# Patient Record
Sex: Female | Born: 1955
Health system: Southern US, Community
[De-identification: ages and names within clinical notes are randomized; demographics above are authoritative.]

## PROBLEM LIST (undated history)

## (undated) DIAGNOSIS — D125 Benign neoplasm of sigmoid colon: Secondary | ICD-10-CM

## (undated) DIAGNOSIS — J329 Chronic sinusitis, unspecified: Secondary | ICD-10-CM

## (undated) DIAGNOSIS — Z972 Presence of dental prosthetic device (complete) (partial): Secondary | ICD-10-CM

## (undated) DIAGNOSIS — K5909 Other constipation: Secondary | ICD-10-CM

## (undated) DIAGNOSIS — J90 Pleural effusion, not elsewhere classified: Secondary | ICD-10-CM

## (undated) DIAGNOSIS — K631 Perforation of intestine (nontraumatic): Secondary | ICD-10-CM

## (undated) DIAGNOSIS — K5792 Diverticulitis of intestine, part unspecified, without perforation or abscess without bleeding: Secondary | ICD-10-CM

## (undated) DIAGNOSIS — N3090 Cystitis, unspecified without hematuria: Secondary | ICD-10-CM

## (undated) DIAGNOSIS — Z0000 Encounter for general adult medical examination without abnormal findings: Secondary | ICD-10-CM

## (undated) HISTORY — DX: Chronic sinusitis, unspecified: J32.9

## (undated) HISTORY — DX: Encounter for general adult medical examination without abnormal findings: Z00.00

## (undated) HISTORY — DX: Perforation of intestine (nontraumatic): K63.1

## (undated) HISTORY — DX: Diverticulitis of intestine, part unspecified, without perforation or abscess without bleeding: K57.92

## (undated) HISTORY — DX: Benign neoplasm of sigmoid colon: D12.5

---

## 2006-09-30 ENCOUNTER — Ambulatory Visit: Payer: Self-pay | Admitting: Obstetrics and Gynecology

## 2007-05-17 ENCOUNTER — Ambulatory Visit: Payer: Self-pay | Admitting: Unknown Physician Specialty

## 2007-05-23 ENCOUNTER — Emergency Department: Payer: Self-pay | Admitting: Emergency Medicine

## 2007-07-26 ENCOUNTER — Ambulatory Visit: Payer: Self-pay | Admitting: Gastroenterology

## 2009-08-01 ENCOUNTER — Ambulatory Visit: Payer: Self-pay | Admitting: Internal Medicine

## 2009-11-26 ENCOUNTER — Ambulatory Visit: Payer: Self-pay | Admitting: General Practice

## 2013-06-15 ENCOUNTER — Ambulatory Visit: Payer: Self-pay | Admitting: Internal Medicine

## 2014-04-07 DIAGNOSIS — J90 Pleural effusion, not elsewhere classified: Secondary | ICD-10-CM

## 2014-04-07 HISTORY — DX: Pleural effusion, not elsewhere classified: J90

## 2014-06-02 ENCOUNTER — Emergency Department: Payer: Self-pay | Admitting: Emergency Medicine

## 2014-12-20 ENCOUNTER — Encounter: Payer: Self-pay | Admitting: Primary Care

## 2014-12-20 ENCOUNTER — Ambulatory Visit (INDEPENDENT_AMBULATORY_CARE_PROVIDER_SITE_OTHER): Payer: Managed Care, Other (non HMO) | Admitting: Primary Care

## 2014-12-20 ENCOUNTER — Encounter (INDEPENDENT_AMBULATORY_CARE_PROVIDER_SITE_OTHER): Payer: Self-pay

## 2014-12-20 VITALS — BP 118/72 | HR 71 | Temp 98.0°F | Ht 64.25 in | Wt 154.4 lb

## 2014-12-20 DIAGNOSIS — J0100 Acute maxillary sinusitis, unspecified: Secondary | ICD-10-CM

## 2014-12-20 DIAGNOSIS — K5731 Diverticulosis of large intestine without perforation or abscess with bleeding: Secondary | ICD-10-CM | POA: Diagnosis not present

## 2014-12-20 DIAGNOSIS — K5900 Constipation, unspecified: Secondary | ICD-10-CM | POA: Diagnosis not present

## 2014-12-20 DIAGNOSIS — K5909 Other constipation: Secondary | ICD-10-CM

## 2014-12-20 MED ORDER — AMOXICILLIN-POT CLAVULANATE 875-125 MG PO TABS: 1.0000 | ORAL_TABLET | Freq: Two times a day (BID) | ORAL | Status: DC

## 2014-12-20 NOTE — Assessment & Plan Note (Signed)
Noted on colonoscopy. Last flare of diverticulitis was February 2016 with ED evaluation. No recent flare or bloody stools.

## 2014-12-20 NOTE — Assessment & Plan Note (Signed)
Evaluated by GI in past who completed study which showed slow transit bowel. BM are once weekly on average. She has a prescription for Linzess which she uses PRN. Uses mineral oil and miralax as well.

## 2014-12-20 NOTE — Progress Notes (Signed)
Subjective:    Patient ID: Natalie [name], female    DOB: [birth year], 59 y.o.   MRN: [id number]  HPI  Natalie [name] is a 59 year old female who presents today to establish care and discuss the problems mentioned below. Will obtain old records.  1) Diverticulosis: Found on colonoscopy. Will get attacks that consist of severe abdominal cramping with constipation and then diarrhea with blood in her stool. Last episode was in February 2016. She presented to the ED and was treated for diverticulitis. She was treated with cipro and flagyl which she could not tolerate.   2) Nasal congestion: Nasal drainage, sneezing, post nasal drip, with nasal congestion over the past 2 weeks. She also reports lightheadedness intermittently when driving in the morning, sinus pressure, and fatigue. Denies fevers. She has a history of chronic sinusitis. She's been using Flonase. She cannot tolerate antihistamines such as Claritin. Overall she feels her symptoms have become worse, especially with nasal congestion and sinus pressure. She doesn't feel well.  3) Chronic constipation: Slow transit bowel. Bowel movements vary and on average will occur once weekly. She has a prescription for Linzess that she uses PRN. She does take mineral oil and/or miralax. Denies recent episodes of blood in her stool.   Review of Systems  Constitutional: Negative for fever and chills.  HENT: Positive for congestion, postnasal drip and sinus pressure. Negative for ear pain, sneezing and sore throat.   Respiratory: Positive for cough. Negative for shortness of breath.   Cardiovascular: Negative for chest pain.  Gastrointestinal: Positive for constipation. Negative for diarrhea.       Chronic constipation: Bowel movements vary  Genitourinary: Negative for difficulty urinating.  Musculoskeletal: Negative for myalgias and arthralgias.  Skin: Negative for rash.  Neurological: Positive for light-headedness. Negative for numbness and  headaches.  Psychiatric/Behavioral:       Denies concerns of anxiety or depression       Past Medical History  Diagnosis Date  . Blood in stool 2/20016  . Diverticulitis   . UTI (lower urinary tract infection)   . Chronic sinusitis     Social History   Social History  . Marital Status: Married    Spouse Name: N/A  . Number of Children: N/A  . Years of Education: N/A   Occupational History  . Not on file.   Social History Main Topics  . Smoking status: Never Smoker   . Smokeless tobacco: Not on file  . Alcohol Use: No  . Drug Use: Not on file  . Sexual Activity: Not on file   Other Topics Concern  . Not on file   Social History Narrative   Married.   Works for [employer] in [place].   Is a Equities trader.   Enjoys spending time with family, reading, walking. She is raising her granddaughter.       History reviewed. No pertinent past surgical history.  Family History  Problem Relation Age of Onset  . Stroke Mother   . Arthritis Father   . Lung cancer Father   . Colon cancer Maternal Aunt   . Breast cancer Paternal Aunt     Allergies  Allergen Reactions  . Codeine Nausea Only  . Levofloxacin In D5w Other (See Comments)    insomnia  . Sulfa Antibiotics Rash    No current outpatient prescriptions on file prior to visit.   No current facility-administered medications on file prior to visit.    BP 118/72 mmHg  Pulse  71  Temp(Src) 98 F (36.7 C) (Oral)  Ht 5' 4.25" (1.632 m)  Wt 154 lb 6.4 oz (70.035 kg)  BMI 26.30 kg/m2  SpO2 99%    Objective:   Physical Exam  Constitutional: She is oriented to person, place, and time. She appears well-nourished.  HENT:  Right Ear: Tympanic membrane and ear canal normal.  Left Ear: Ear canal normal. A middle ear effusion is present.  Nose: Right sinus exhibits maxillary sinus tenderness. Right sinus exhibits no frontal sinus tenderness. Left sinus exhibits maxillary sinus tenderness. Left  sinus exhibits no frontal sinus tenderness.  Mouth/Throat: Oropharynx is clear and moist.  Eyes: Conjunctivae are normal. Pupils are equal, round, and reactive to light.  Neck: Neck supple.  Cardiovascular: Normal rate and regular rhythm.   Pulmonary/Chest: Effort normal and breath sounds normal.  Lymphadenopathy:    She has no cervical adenopathy.  Neurological: She is alert and oriented to person, place, and time.  Skin: Skin is warm and dry.          Assessment & Plan:  Sinusitis:  Nasal congestion, sinus pressure, fatigue x 2 weeks. Overall symptoms are worse, especially maxillary sinus pressure. Maxillary sinuses tender upon exam, overall exam unreamarkable. Due to fatigue and duration of symptoms will start Augmentin BID x 10 days. Use Flonase, neti pots. Follow up PRN

## 2014-12-20 NOTE — Progress Notes (Signed)
Pre visit review using our clinic review tool, if applicable. No additional management support is needed unless otherwise documented below in the visit note. 

## 2014-12-20 NOTE — Patient Instructions (Signed)
Start Augmentin antibiotics. Take 1 tablet by mouth twice daily for 10 days.  Continue to use fluticasone as tolerated. You may also use Neti Pots, over the counter, which will help to irrigate your sinuses.   Increase in take of water to stay hydrated.  Please schedule a physical with me in the next 3 months. You will also schedule a lab only appointment one week prior. We will discuss your lab results during your physical.  It was a pleasure to meet you today! Please don't hesitate to call me with any questions. Welcome to [facility]!

## 2015-02-23 ENCOUNTER — Other Ambulatory Visit: Payer: Self-pay | Admitting: Internal Medicine

## 2015-02-23 DIAGNOSIS — Z0000 Encounter for general adult medical examination without abnormal findings: Secondary | ICD-10-CM

## 2015-02-27 ENCOUNTER — Inpatient Hospital Stay
Admission: EM | Admit: 2015-02-27 | Discharge: 2015-03-16 | DRG: 329 | Disposition: A | Discharge status: Home Health | Payer: Managed Care, Other (non HMO) | Attending: Surgery | Admitting: Surgery

## 2015-02-27 ENCOUNTER — Encounter: Payer: Self-pay | Admitting: Emergency Medicine

## 2015-02-27 DIAGNOSIS — R109 Unspecified abdominal pain: Secondary | ICD-10-CM

## 2015-02-27 DIAGNOSIS — K913 Postprocedural intestinal obstruction: Secondary | ICD-10-CM | POA: Diagnosis not present

## 2015-02-27 DIAGNOSIS — K5720 Diverticulitis of large intestine with perforation and abscess without bleeding: Principal | ICD-10-CM | POA: Diagnosis present

## 2015-02-27 DIAGNOSIS — Y838 Other surgical procedures as the cause of abnormal reaction of the patient, or of later complication, without mention of misadventure at the time of the procedure: Secondary | ICD-10-CM | POA: Diagnosis not present

## 2015-02-27 DIAGNOSIS — J9589 Other postprocedural complications and disorders of respiratory system, not elsewhere classified: Secondary | ICD-10-CM | POA: Diagnosis not present

## 2015-02-27 DIAGNOSIS — R0602 Shortness of breath: Secondary | ICD-10-CM

## 2015-02-27 DIAGNOSIS — R0600 Dyspnea, unspecified: Secondary | ICD-10-CM

## 2015-02-27 DIAGNOSIS — J0100 Acute maxillary sinusitis, unspecified: Secondary | ICD-10-CM

## 2015-02-27 DIAGNOSIS — I959 Hypotension, unspecified: Secondary | ICD-10-CM | POA: Diagnosis present

## 2015-02-27 DIAGNOSIS — K5909 Other constipation: Secondary | ICD-10-CM | POA: Diagnosis present

## 2015-02-27 DIAGNOSIS — E8770 Fluid overload, unspecified: Secondary | ICD-10-CM | POA: Diagnosis not present

## 2015-02-27 DIAGNOSIS — D473 Essential (hemorrhagic) thrombocythemia: Secondary | ICD-10-CM | POA: Diagnosis not present

## 2015-02-27 DIAGNOSIS — Z882 Allergy status to sulfonamides status: Secondary | ICD-10-CM

## 2015-02-27 DIAGNOSIS — K631 Perforation of intestine (nontraumatic): Secondary | ICD-10-CM

## 2015-02-27 DIAGNOSIS — L03311 Cellulitis of abdominal wall: Secondary | ICD-10-CM | POA: Diagnosis not present

## 2015-02-27 DIAGNOSIS — Z881 Allergy status to other antibiotic agents status: Secondary | ICD-10-CM

## 2015-02-27 DIAGNOSIS — J9811 Atelectasis: Secondary | ICD-10-CM | POA: Diagnosis not present

## 2015-02-27 DIAGNOSIS — R198 Other specified symptoms and signs involving the digestive system and abdomen: Secondary | ICD-10-CM

## 2015-02-27 DIAGNOSIS — J90 Pleural effusion, not elsewhere classified: Secondary | ICD-10-CM | POA: Diagnosis not present

## 2015-02-27 DIAGNOSIS — R21 Rash and other nonspecific skin eruption: Secondary | ICD-10-CM | POA: Diagnosis not present

## 2015-02-27 DIAGNOSIS — B370 Candidal stomatitis: Secondary | ICD-10-CM | POA: Diagnosis not present

## 2015-02-27 DIAGNOSIS — Z9889 Other specified postprocedural states: Secondary | ICD-10-CM

## 2015-02-27 DIAGNOSIS — T814XXA Infection following a procedure, initial encounter: Secondary | ICD-10-CM | POA: Diagnosis not present

## 2015-02-27 DIAGNOSIS — J029 Acute pharyngitis, unspecified: Secondary | ICD-10-CM | POA: Diagnosis not present

## 2015-02-27 DIAGNOSIS — J869 Pyothorax without fistula: Secondary | ICD-10-CM | POA: Diagnosis not present

## 2015-02-27 DIAGNOSIS — K5732 Diverticulitis of large intestine without perforation or abscess without bleeding: Secondary | ICD-10-CM | POA: Insufficient documentation

## 2015-02-27 DIAGNOSIS — J95821 Acute postprocedural respiratory failure: Secondary | ICD-10-CM | POA: Diagnosis not present

## 2015-02-27 DIAGNOSIS — Z885 Allergy status to narcotic agent status: Secondary | ICD-10-CM

## 2015-02-27 MED ORDER — MORPHINE SULFATE (PF) 2 MG/ML IV SOLN
INTRAVENOUS | Status: AC
  Administered 2015-02-27: 2 mg via INTRAVENOUS
  Filled 2015-02-27: qty 1

## 2015-02-27 MED ORDER — IOHEXOL 240 MG/ML SOLN
25.0000 mL | Freq: Once | INTRAMUSCULAR | Status: AC | PRN
  Administered 2015-02-27: 25 mL via ORAL

## 2015-02-27 MED ORDER — ONDANSETRON HCL 4 MG/2ML IJ SOLN
INTRAMUSCULAR | Status: AC
  Administered 2015-02-27: 4 mg via INTRAVENOUS
  Filled 2015-02-27: qty 2

## 2015-02-27 MED ORDER — MORPHINE SULFATE (PF) 2 MG/ML IV SOLN
2.0000 mg | Freq: Once | INTRAVENOUS | Status: AC
  Administered 2015-02-27: 2 mg via INTRAVENOUS

## 2015-02-27 MED ORDER — SODIUM CHLORIDE 0.9 % IV BOLUS (SEPSIS)
1000.0000 mL | Freq: Once | INTRAVENOUS | Status: AC
  Administered 2015-02-27: 1000 mL via INTRAVENOUS

## 2015-02-27 MED ORDER — ONDANSETRON HCL 4 MG/2ML IJ SOLN
4.0000 mg | Freq: Once | INTRAMUSCULAR | Status: AC
  Administered 2015-02-27: 4 mg via INTRAVENOUS

## 2015-02-27 NOTE — ED Notes (Addendum)
Pt presents to ED via EMS with c/o abdominal pain, onset today with nausea and dry heaves. No bowl movement for about a week.

## 2015-02-27 NOTE — ED Provider Notes (Signed)
[facility] Emergency Department Provider Note  ____________________________________________  Time seen: 11:15 PM  I have reviewed the triage vital signs and the nursing notes.   HISTORY  Chief Complaint Abdominal Pain     HPI Natalie [name] is a 59 y.o. female resents with 10 out of 10 left lower quadrant/right lower quadrant abdominal pain accompanied by nausea with acute onset today that has progressed over the course of the day. Patient admits to constipation however denies any fever or recent diarrhea. Patient noted to be hypotensive on presentation with a systolic blood pressure in the 80s.    Past Medical History  Diagnosis Date  . Blood in stool 2/20016  . Diverticulitis   . UTI (lower urinary tract infection)   . Chronic sinusitis     Patient Active Problem List   Diagnosis Date Noted  . Chronic constipation 12/20/2014  . Diverticulosis 12/20/2014    History reviewed. No pertinent past surgical history.  Current Outpatient Rx  Name  Route  Sig  Dispense  Refill  . amoxicillin-clavulanate (AUGMENTIN) 875-125 MG per tablet   Oral   Take 1 tablet by mouth 2 (two) times daily.   20 tablet   0   . DOCUSATE CALCIUM PO   Oral   Take 250 mg by mouth as needed.         . fluticasone (FLONASE) 50 MCG/ACT nasal spray   Each Nare   Place 1 spray into both nostrils daily.         . mineral oil liquid   Oral   Take 15 mLs by mouth as needed for moderate constipation. 1 Tbsp PRN         . Multiple Vitamins-Minerals (MULTIVITAMIN ADULT PO)   Oral   Take 1 tablet by mouth daily.           Allergies Codeine; Levofloxacin in d5w; and Sulfa antibiotics  Family History  Problem Relation Age of Onset  . Stroke Mother   . Arthritis Father   . Lung cancer Father   . Colon cancer Maternal Aunt   . Breast cancer Paternal Aunt     Social History Social History  Substance Use Topics  . Smoking status: Never Smoker   .  Smokeless tobacco: None  . Alcohol Use: No    Review of Systems  Constitutional: Negative for fever. Eyes: Negative for visual changes. ENT: Negative for sore throat. Cardiovascular: Negative for chest pain. Respiratory: Negative for shortness of breath. Gastrointestinal: Positive for abdominal pain and nausea Genitourinary: Negative for dysuria. Musculoskeletal: Negative for back pain. Skin: Negative for rash. Neurological: Negative for headaches, focal weakness or numbness.   10-point ROS otherwise negative.  ____________________________________________   PHYSICAL EXAM:  VITAL SIGNS: ED Triage Vitals  Enc Vitals Group     BP 02/27/15 2316 93/57 mmHg     Pulse Rate 02/27/15 2316 85     Resp 02/27/15 2316 19     Temp 02/27/15 2316 97.4 F (36.3 C)     Temp Source 02/27/15 2316 Oral     SpO2 02/27/15 2316 97 %     Weight --      Height --      Head Cir --      Peak Flow --      Pain Score 02/27/15 2320 10     Pain Loc --      Pain Edu? --      Excl. in [place]? --  Constitutional: Alert and oriented. Well appearing and in no distress. Eyes: Conjunctivae are normal. PERRL. Normal extraocular movements. ENT   Head: Normocephalic and atraumatic.   Nose: No congestion/rhinnorhea.   Mouth/Throat: Mucous membranes are moist.   Neck: No stridor. Hematological/Lymphatic/Immunilogical: No cervical lymphadenopathy. Cardiovascular: Normal rate, regular rhythm. Normal and symmetric distal pulses are present in all extremities. No murmurs, rubs, or gallops. Respiratory: Normal respiratory effort without tachypnea nor retractions. Breath sounds are clear and equal bilaterally. No wheezes/rales/rhonchi. Gastrointestinal: Left lower quadrant/right lower quadrant tenderness to palpation. No distention. There is no CVA tenderness. Genitourinary: deferred Musculoskeletal: Nontender with normal range of motion in all extremities. No joint effusions.  No lower extremity  tenderness nor edema. Neurologic:  Normal speech and language. No gross focal neurologic deficits are appreciated. Speech is normal.  Skin:  Skin is warm, dry and intact. No rash noted. Psychiatric: Mood and affect are normal. Speech and behavior are normal. Patient exhibits appropriate insight and judgment.  ____________________________________________    LABS (pertinent positives/negatives)  Labs Reviewed  CBC WITH DIFFERENTIAL/PLATELET - Abnormal; Notable for the following:    WBC 14.0 (*)    Neutro Abs 12.0 (*)    All other components within normal limits  COMPREHENSIVE METABOLIC PANEL - Abnormal; Notable for the following:    Potassium 3.1 (*)    Glucose, Bld 170 (*)    All other components within normal limits  LIPASE, BLOOD         RADIOLOGY  CT Abdomen Pelvis W Contrast (Final result) Result time: 02/28/15 01:57:09   Final result by Rad Results In Interface (02/28/15 01:57:09)   Narrative:   CLINICAL DATA: Left lower quadrant and right lower quadrant abdominal pain onset today. Nausea. No bowel movement for a week.  EXAM: CT ABDOMEN AND PELVIS WITH CONTRAST  TECHNIQUE: Multidetector CT imaging of the abdomen and pelvis was performed using the standard protocol following bolus administration of intravenous contrast.  CONTRAST: 175mL OMNIPAQUE IOHEXOL 300 MG/ML SOLN  COMPARISON: None.  FINDINGS: Atelectasis in the lung bases.  There is free intra-abdominal air demonstrated mostly in the upper abdomen but extending inferiorly into the pelvis. This is concerning for bowel perforation. There is a small amount of free fluid in the abdomen and pelvis. Focal thickening and infiltration in the wall of the sigmoid colon suggests sigmoid diverticulitis as a possible site of perforation.  The liver, spleen, gallbladder, pancreas, adrenal glands, kidneys, abdominal aorta, inferior vena cava, and retroperitoneal lymph nodes are unremarkable. Stomach is filled  with contrast material without evidence of wall thickening or abnormal distention. Small bowel are decompressed. Diffusely stool-filled colon.  Pelvis: Uterus and ovaries are not enlarged. Bladder wall is not thickened. No pelvic mass or lymphadenopathy is suggested. No destructive bone lesions.  IMPRESSION: Free intraperitoneal air and free fluid in the abdomen with focal thickening and infiltration in the wall of the sigmoid colon. Appearance is consistent with bowel perforation, likely resulting from sigmoid diverticulitis.  These results were called by telephone at the time of interpretation on 02/28/2015 at 1:54 am to Dr. Marjean Donna , who verbally acknowledged these results.   Electronically Signed By: Lucienne Capers M.D. On: 02/28/2015 01:57      Critical Care performed: CRITICAL CARE Performed by: Marjean Donna N   Total critical care time: 60 minutes  Critical care time was exclusive of separately billable procedures and treating other patients.  Critical care was necessary to treat or prevent imminent or life-threatening deterioration.  Critical care was time  spent personally by me on the following activities: development of treatment plan with patient and/or surrogate as well as nursing, discussions with consultants, evaluation of patient's response to treatment, examination of patient, obtaining history from patient or surrogate, ordering and performing treatments and interventions, ordering and review of laboratory studies, ordering and review of radiographic studies, pulse oximetry and re-evaluation of patient's condition.  ____________________________________________   INITIAL IMPRESSION / ASSESSMENT AND PLAN / ED COURSE  Pertinent labs & imaging results that were available during my care of the patient were reviewed by me and considered in my medical decision making (see chart for details).  Patient received 30 mls/kg of normal saline, IV  vancomycin and Zosyn for a perforated intra-abdominal viscus  ____________________________________________   FINAL CLINICAL IMPRESSION(S) / ED DIAGNOSES  Final diagnoses:  Sigmoid diverticulitis  Perforated viscus      Gregor Hams, MD 03/02/15 [phone]

## 2015-02-28 ENCOUNTER — Emergency Department: Payer: Managed Care, Other (non HMO)

## 2015-02-28 DIAGNOSIS — L03311 Cellulitis of abdominal wall: Secondary | ICD-10-CM | POA: Diagnosis not present

## 2015-02-28 DIAGNOSIS — J90 Pleural effusion, not elsewhere classified: Secondary | ICD-10-CM | POA: Diagnosis not present

## 2015-02-28 DIAGNOSIS — J869 Pyothorax without fistula: Secondary | ICD-10-CM | POA: Diagnosis not present

## 2015-02-28 DIAGNOSIS — J029 Acute pharyngitis, unspecified: Secondary | ICD-10-CM | POA: Diagnosis not present

## 2015-02-28 DIAGNOSIS — D473 Essential (hemorrhagic) thrombocythemia: Secondary | ICD-10-CM | POA: Diagnosis not present

## 2015-02-28 DIAGNOSIS — J9589 Other postprocedural complications and disorders of respiratory system, not elsewhere classified: Secondary | ICD-10-CM | POA: Diagnosis not present

## 2015-02-28 DIAGNOSIS — R0600 Dyspnea, unspecified: Secondary | ICD-10-CM | POA: Diagnosis not present

## 2015-02-28 DIAGNOSIS — J948 Other specified pleural conditions: Secondary | ICD-10-CM | POA: Diagnosis not present

## 2015-02-28 DIAGNOSIS — J9621 Acute and chronic respiratory failure with hypoxia: Secondary | ICD-10-CM | POA: Diagnosis not present

## 2015-02-28 DIAGNOSIS — R0602 Shortness of breath: Secondary | ICD-10-CM | POA: Diagnosis not present

## 2015-02-28 DIAGNOSIS — R21 Rash and other nonspecific skin eruption: Secondary | ICD-10-CM | POA: Diagnosis not present

## 2015-02-28 DIAGNOSIS — J95821 Acute postprocedural respiratory failure: Secondary | ICD-10-CM | POA: Diagnosis not present

## 2015-02-28 DIAGNOSIS — Y838 Other surgical procedures as the cause of abnormal reaction of the patient, or of later complication, without mention of misadventure at the time of the procedure: Secondary | ICD-10-CM | POA: Diagnosis not present

## 2015-02-28 DIAGNOSIS — K913 Postprocedural intestinal obstruction: Secondary | ICD-10-CM | POA: Diagnosis not present

## 2015-02-28 DIAGNOSIS — E8770 Fluid overload, unspecified: Secondary | ICD-10-CM | POA: Diagnosis not present

## 2015-02-28 DIAGNOSIS — Z881 Allergy status to other antibiotic agents status: Secondary | ICD-10-CM | POA: Diagnosis not present

## 2015-02-28 DIAGNOSIS — K5720 Diverticulitis of large intestine with perforation and abscess without bleeding: Principal | ICD-10-CM

## 2015-02-28 DIAGNOSIS — Z885 Allergy status to narcotic agent status: Secondary | ICD-10-CM | POA: Diagnosis not present

## 2015-02-28 DIAGNOSIS — Z882 Allergy status to sulfonamides status: Secondary | ICD-10-CM | POA: Diagnosis not present

## 2015-02-28 DIAGNOSIS — B370 Candidal stomatitis: Secondary | ICD-10-CM | POA: Diagnosis not present

## 2015-02-28 DIAGNOSIS — J9601 Acute respiratory failure with hypoxia: Secondary | ICD-10-CM | POA: Diagnosis not present

## 2015-02-28 DIAGNOSIS — R69 Illness, unspecified: Secondary | ICD-10-CM | POA: Diagnosis not present

## 2015-02-28 DIAGNOSIS — K631 Perforation of intestine (nontraumatic): Secondary | ICD-10-CM | POA: Diagnosis not present

## 2015-02-28 DIAGNOSIS — J9811 Atelectasis: Secondary | ICD-10-CM | POA: Diagnosis not present

## 2015-02-28 DIAGNOSIS — I959 Hypotension, unspecified: Secondary | ICD-10-CM | POA: Diagnosis present

## 2015-02-28 DIAGNOSIS — T814XXA Infection following a procedure, initial encounter: Secondary | ICD-10-CM | POA: Diagnosis not present

## 2015-02-28 DIAGNOSIS — K5909 Other constipation: Secondary | ICD-10-CM | POA: Diagnosis present

## 2015-02-28 DIAGNOSIS — Z9889 Other specified postprocedural states: Secondary | ICD-10-CM | POA: Diagnosis not present

## 2015-02-28 DIAGNOSIS — K5732 Diverticulitis of large intestine without perforation or abscess without bleeding: Secondary | ICD-10-CM | POA: Diagnosis not present

## 2015-02-28 LAB — CBC WITH DIFFERENTIAL/PLATELET
Basophils Absolute: 0 10*3/uL (ref 0–0.1)
Basophils Relative: 0 %
Eosinophils Absolute: 0.1 10*3/uL (ref 0–0.7)
Eosinophils Relative: 1 %
HCT: 42.2 % (ref 35.0–47.0)
Hemoglobin: 14.1 g/dL (ref 12.0–16.0)
Lymphocytes Relative: 10 %
Lymphs Abs: 1.4 10*3/uL (ref 1.0–3.6)
MCH: 31.8 pg (ref 26.0–34.0)
MCHC: 33.5 g/dL (ref 32.0–36.0)
MCV: 95 fL (ref 80.0–100.0)
Monocytes Absolute: 0.4 10*3/uL (ref 0.2–0.9)
Monocytes Relative: 3 %
Neutro Abs: 12 10*3/uL — ABNORMAL HIGH (ref 1.4–6.5)
Neutrophils Relative %: 86 %
Platelets: 245 10*3/uL (ref 150–440)
RBC: 4.44 MIL/uL (ref 3.80–5.20)
RDW: 12.7 % (ref 11.5–14.5)
WBC: 14 10*3/uL — ABNORMAL HIGH (ref 3.6–11.0)

## 2015-02-28 LAB — CBC
HEMATOCRIT: 41.1 % (ref 35.0–47.0)
HEMOGLOBIN: 13.9 g/dL (ref 12.0–16.0)
MCH: 32.5 pg (ref 26.0–34.0)
MCHC: 33.7 g/dL (ref 32.0–36.0)
MCV: 96.4 fL (ref 80.0–100.0)
Platelets: 205 10*3/uL (ref 150–440)
RBC: 4.26 MIL/uL (ref 3.80–5.20)
RDW: 13.2 % (ref 11.5–14.5)
WBC: 10.2 10*3/uL (ref 3.6–11.0)

## 2015-02-28 LAB — BASIC METABOLIC PANEL
Anion gap: 8 (ref 5–15)
BUN: 8 mg/dL (ref 6–20)
CALCIUM: 8 mg/dL — AB (ref 8.9–10.3)
CHLORIDE: 112 mmol/L — AB (ref 101–111)
CO2: 21 mmol/L — AB (ref 22–32)
CREATININE: 0.63 mg/dL (ref 0.44–1.00)
GFR calc Af Amer: >60 mL/min (ref >60)
GFR calc non Af Amer: >60 mL/min (ref >60)
GLUCOSE: 112 mg/dL — AB (ref 65–99)
Potassium: 3.7 mmol/L (ref 3.5–5.1)
Sodium: 141 mmol/L (ref 135–145)

## 2015-02-28 LAB — LIPASE, BLOOD: LIPASE: 23 U/L (ref 11–51)

## 2015-02-28 LAB — COMPREHENSIVE METABOLIC PANEL
ALT: 14 U/L (ref 14–54)
AST: 22 U/L (ref 15–41)
Albumin: 4.2 g/dL (ref 3.5–5.0)
Alkaline Phosphatase: 68 U/L (ref 38–126)
Anion gap: 9 (ref 5–15)
BILIRUBIN TOTAL: 1 mg/dL (ref 0.3–1.2)
BUN: 11 mg/dL (ref 6–20)
CO2: 26 mmol/L (ref 22–32)
CREATININE: 0.73 mg/dL (ref 0.44–1.00)
Calcium: 9 mg/dL (ref 8.9–10.3)
Chloride: 106 mmol/L (ref 101–111)
GFR calc Af Amer: >60 mL/min (ref >60)
Glucose, Bld: 170 mg/dL — ABNORMAL HIGH (ref 65–99)
POTASSIUM: 3.1 mmol/L — AB (ref 3.5–5.1)
Sodium: 141 mmol/L (ref 135–145)
TOTAL PROTEIN: 7.2 g/dL (ref 6.5–8.1)

## 2015-02-28 MED ORDER — PROMETHAZINE HCL 25 MG/ML IJ SOLN
INTRAMUSCULAR | Status: AC
  Administered 2015-02-28: 12.5 mg via INTRAVENOUS
  Filled 2015-02-28: qty 1

## 2015-02-28 MED ORDER — SODIUM CHLORIDE 0.9 % IV BOLUS (SEPSIS)
1000.0000 mL | Freq: Once | INTRAVENOUS | Status: AC
  Administered 2015-02-28: 1000 mL via INTRAVENOUS

## 2015-02-28 MED ORDER — ONDANSETRON HCL 4 MG/2ML IJ SOLN
4.0000 mg | Freq: Four times a day (QID) | INTRAMUSCULAR | Status: DC | PRN
  Administered 2015-03-01: 4 mg via INTRAVENOUS

## 2015-02-28 MED ORDER — ACETAMINOPHEN 650 MG RE SUPP: 650.0000 mg | Freq: Four times a day (QID) | RECTAL | Status: DC | PRN

## 2015-02-28 MED ORDER — NALOXONE HCL 0.4 MG/ML IJ SOLN
0.4000 mg | INTRAMUSCULAR | Status: DC | PRN
  Filled 2015-02-28: qty 1

## 2015-02-28 MED ORDER — ONDANSETRON HCL 4 MG/2ML IJ SOLN
4.0000 mg | Freq: Once | INTRAMUSCULAR | Status: AC
  Administered 2015-02-28: 4 mg via INTRAVENOUS

## 2015-02-28 MED ORDER — ACETAMINOPHEN 325 MG PO TABS
650.0000 mg | ORAL_TABLET | Freq: Four times a day (QID) | ORAL | Status: DC | PRN
Start: 2015-02-28 — End: 2015-03-02

## 2015-02-28 MED ORDER — IOHEXOL 300 MG/ML  SOLN
100.0000 mL | Freq: Once | INTRAMUSCULAR | Status: AC | PRN
  Administered 2015-02-28: 100 mL via INTRAVENOUS

## 2015-02-28 MED ORDER — VANCOMYCIN HCL IN DEXTROSE 1-5 GM/200ML-% IV SOLN
1000.0000 mg | Freq: Once | INTRAVENOUS | Status: AC
  Administered 2015-02-28: 1000 mg via INTRAVENOUS
  Filled 2015-02-28: qty 200

## 2015-02-28 MED ORDER — PIPERACILLIN-TAZOBACTAM 3.375 G IVPB
3.3750 g | Freq: Three times a day (TID) | INTRAVENOUS | Status: DC
  Administered 2015-02-28 – 2015-03-16 (×46): 3.375 g via INTRAVENOUS
  Filled 2015-02-28 (×55): qty 50

## 2015-02-28 MED ORDER — HYDROMORPHONE HCL 1 MG/ML IJ SOLN
1.0000 mg | Freq: Once | INTRAMUSCULAR | Status: AC
  Administered 2015-02-28: 1 mg via INTRAVENOUS
  Filled 2015-02-28: qty 1

## 2015-02-28 MED ORDER — HYDROMORPHONE 1 MG/ML IV SOLN
INTRAVENOUS | Status: DC
  Administered 2015-02-28: 0.4 mg via INTRAVENOUS
  Administered 2015-02-28: 05:00:00 via INTRAVENOUS
  Administered 2015-02-28: 0.2 mg via INTRAVENOUS
  Filled 2015-02-28 (×2): qty 25

## 2015-02-28 MED ORDER — PIPERACILLIN-TAZOBACTAM 3.375 G IVPB
3.3750 g | Freq: Three times a day (TID) | INTRAVENOUS | Status: DC
  Filled 2015-02-28: qty 50

## 2015-02-28 MED ORDER — HEPARIN SODIUM (PORCINE) 5000 UNIT/ML IJ SOLN
5000.0000 [IU] | Freq: Three times a day (TID) | INTRAMUSCULAR | Status: DC
  Administered 2015-02-28 – 2015-03-16 (×47): 5000 [IU] via SUBCUTANEOUS
  Filled 2015-02-28 (×47): qty 1

## 2015-02-28 MED ORDER — DIPHENHYDRAMINE HCL 50 MG/ML IJ SOLN
12.5000 mg | Freq: Four times a day (QID) | INTRAMUSCULAR | Status: DC | PRN
  Administered 2015-03-01: 12.5 mg via INTRAVENOUS
  Filled 2015-02-28: qty 1

## 2015-02-28 MED ORDER — ONDANSETRON 4 MG PO TBDP
4.0000 mg | ORAL_TABLET | Freq: Four times a day (QID) | ORAL | Status: DC | PRN
  Administered 2015-03-14: 4 mg via ORAL
  Filled 2015-02-28: qty 1

## 2015-02-28 MED ORDER — ONDANSETRON HCL 4 MG/2ML IJ SOLN
INTRAMUSCULAR | Status: AC
  Administered 2015-02-28: 4 mg via INTRAVENOUS
  Filled 2015-02-28: qty 2

## 2015-02-28 MED ORDER — SODIUM CHLORIDE 0.9 % IV SOLN
INTRAVENOUS | Status: DC
  Administered 2015-02-28 – 2015-03-02 (×8): via INTRAVENOUS

## 2015-02-28 MED ORDER — METRONIDAZOLE IN NACL 5-0.79 MG/ML-% IV SOLN
INTRAVENOUS | Status: AC
  Filled 2015-02-28: qty 100

## 2015-02-28 MED ORDER — DIPHENHYDRAMINE HCL 12.5 MG/5ML PO ELIX
12.5000 mg | ORAL_SOLUTION | Freq: Four times a day (QID) | ORAL | Status: DC | PRN
Start: 2015-02-28 — End: 2015-03-02

## 2015-02-28 MED ORDER — CIPROFLOXACIN IN D5W 400 MG/200ML IV SOLN
INTRAVENOUS | Status: AC
  Filled 2015-02-28: qty 200

## 2015-02-28 MED ORDER — PIPERACILLIN-TAZOBACTAM 3.375 G IVPB: 3.3750 g | Freq: Four times a day (QID) | INTRAVENOUS | Status: DC

## 2015-02-28 MED ORDER — CETYLPYRIDINIUM CHLORIDE 0.05 % MT LIQD
7.0000 mL | Freq: Two times a day (BID) | OROMUCOSAL | Status: DC
  Administered 2015-02-28 – 2015-03-15 (×23): 7 mL via OROMUCOSAL

## 2015-02-28 MED ORDER — PANTOPRAZOLE SODIUM 40 MG IV SOLR
40.0000 mg | Freq: Every day | INTRAVENOUS | Status: DC
  Administered 2015-02-28 – 2015-03-12 (×13): 40 mg via INTRAVENOUS
  Filled 2015-02-28 (×13): qty 40

## 2015-02-28 MED ORDER — PROMETHAZINE HCL 25 MG/ML IJ SOLN
12.5000 mg | Freq: Once | INTRAMUSCULAR | Status: AC
  Administered 2015-02-28: 12.5 mg via INTRAVENOUS

## 2015-02-28 MED ORDER — ONDANSETRON HCL 4 MG/2ML IJ SOLN
4.0000 mg | Freq: Four times a day (QID) | INTRAMUSCULAR | Status: DC | PRN
  Administered 2015-03-08 – 2015-03-12 (×8): 4 mg via INTRAVENOUS
  Filled 2015-02-28 (×9): qty 2

## 2015-02-28 MED ORDER — SODIUM CHLORIDE 0.9 % IJ SOLN: 9.0000 mL | INTRAMUSCULAR | Status: DC | PRN

## 2015-02-28 MED ORDER — PIPERACILLIN-TAZOBACTAM 3.375 G IVPB
3.3750 g | Freq: Once | INTRAVENOUS | Status: AC
  Administered 2015-02-28: 3.375 g via INTRAVENOUS
  Filled 2015-02-28: qty 50

## 2015-02-28 NOTE — Progress Notes (Signed)
59 yr old female with diverticulitis complicated by some free air in abdomen.  She has been doing well with pain medication, states pain has improved.  She has been up and moving some.   Filed Vitals:   02/28/15 1339 02/28/15 1619  BP: 102/60   Pulse: 114   Temp: 99 F (37.2 C)   Resp: 18 29   PE:  Gen: NAD Cardio: tachycardic, regular rhythm Abd: soft, tender in suprapubic region, no peritonitis no rebound Ext: 2+ pulses, no edema  CBC Latest Ref Rng 02/28/2015 02/27/2015  WBC 3.6 - 11.0 K/uL 10.2 14.0(H)  Hemoglobin 12.0 - 16.0 g/dL 13.9 14.1  Hematocrit 35.0 - 47.0 % 41.1 42.2  Platelets 150 - 440 K/uL 205 245   CMP Latest Ref Rng 02/28/2015 02/27/2015  Glucose 65 - 99 mg/dL 112(H) 170(H)  BUN 6 - 20 mg/dL 8 11  Creatinine 0.44 - 1.00 mg/dL 0.63 0.73  Sodium 135 - 145 mmol/L 141 141  Potassium 3.5 - 5.1 mmol/L 3.7 3.1(L)  Chloride 101 - 111 mmol/L 112(H) 106  CO2 22 - 32 mmol/L 21(L) 26  Calcium 8.9 - 10.3 mg/dL 8.0(L) 9.0  Total Protein 6.5 - 8.1 g/dL - 7.2  Total Bilirubin 0.3 - 1.2 mg/dL - 1.0  Alkaline Phos 38 - 126 U/L - 68  AST 15 - 41 U/L - 22  ALT 14 - 54 U/L - 14    A/P:  59 yr old female with acute complicated diverticulitis --continue with NPO, IV abx and IV pain medication ---Patient and family understand that if she were to worsen clinically or to not improve over the next couple days she might need operation.

## 2015-02-28 NOTE — ED Notes (Signed)
Dr Owens Shark okay'd hanging zosyn for 30 minutes.

## 2015-02-28 NOTE — ED Notes (Signed)
Pt c/o nausea.  Dr Owens Shark notified.  Pt medicated per MAR.  Pt states she can't tolerate more of CT contrast, Dr Owens Shark notified.

## 2015-02-28 NOTE — ED Notes (Signed)
Pt's BP dropped, Dr Owens Shark notified.  2nd bolus started.

## 2015-02-28 NOTE — H&P (Signed)
CC: Suprapubic pain x 1 day  HPI: Natalie [name] is a pleasant 59 yo F with a history of multiple prior episodes of diverticulitis who presents with 1 day of worsening abdominal pain.  Began acutely this am.  Worsened around 9.  + chills.  + nausea/vomiting with CT contrast.  No recent BM.  Has had a history of chronic constipation.  Has had approx 8 episodes of diverticulitis prior, approx 2 per year x 4 years.  Has never seen a surgeon for diverticulitis.  Last colonoscopy approx 8 years ago.  No chest pain, shortness of breath, cough, dysuria/hematuria.  Has received 4 mg of morphine during entire time in ER prior to my arrival.    Active Ambulatory Problems    Diagnosis Date Noted  . Chronic constipation 12/20/2014  . Diverticulosis 12/20/2014   Resolved Ambulatory Problems    Diagnosis Date Noted  . No Resolved Ambulatory Problems   Past Medical History  Diagnosis Date  . Blood in stool 2/20016  . Diverticulitis   . UTI (lower urinary tract infection)   . Chronic sinusitis    History reviewed. No pertinent past surgical history.     Medication List    ASK your doctor about these medications        amoxicillin-clavulanate 875-125 MG tablet  Commonly known as:  AUGMENTIN  Take 1 tablet by mouth 2 (two) times daily.     docusate calcium 240 MG capsule  Commonly known as:  SURFAK  Take 240 mg by mouth daily as needed for mild constipation.     fluticasone 50 MCG/ACT nasal spray  Commonly known as:  FLONASE  Place 1 spray into both nostrils daily as needed for allergies or rhinitis.     mineral oil liquid  Take 15 mLs by mouth as needed for moderate constipation. 1 Tbsp PRN       Allergies  Allergen Reactions  . Codeine Nausea Only  . Levofloxacin In D5w Other (See Comments)    insomnia  . Sulfa Antibiotics Rash   Social History   Social History  . Marital Status: Married    Spouse Name: N/A  . Number of Children: N/A  . Years of Education: N/A   Occupational  History  . Not on file.   Social History Main Topics  . Smoking status: Never Smoker   . Smokeless tobacco: Not on file  . Alcohol Use: No  . Drug Use: Not on file  . Sexual Activity: Not on file   Other Topics Concern  . Not on file   Social History Narrative   Married.   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   Enjoys spending time with family, reading, walking. She is raising her granddaughter.      Family History  Problem Relation Age of Onset  . Stroke Mother   . Arthritis Father   . Lung cancer Father   . Colon cancer Maternal Aunt   . Breast cancer Paternal Aunt    ROS: Full ROS obtained, pertinent positives and negatives as above  Blood pressure 105/69, pulse 104, temperature 97.4 F (36.3 C), temperature source Oral, resp. rate 17, height 5' 4.5" (1.638 m), weight 150 lb (68.04 kg), SpO2 93 %. GEN: NAD/A&Ox3 FACE: no obvious facial trauma, normal external nose, normal external ears EYES: no scleral icterus, no conjunctivitis HEAD: normocephalic atraumatic CV: RRR, no MRG RESP: moving air well, lungs clear ABD: soft, significantly tender bilateral suprapubic, nondistended  EXT: moving all ext well, strength 5/5 NEURO: cnII-XII grossly intact, sensation intact all 4 ext  Labs: Personally reviewed, significant for  WBC 14, 86% neutrophils K 3.1 Cr .73  CT: Personally reviewed, small amount of pneumoperitoneum and pelvic free fluid.  Thickening of sigmoid colon.  Significant stool burdon  A/P 59 yo with acute onset abdominal pain, leukocytosis.  Small amount of pneumoperitoneum.  Tender on exam.  In pain but has not received significant amount of pain medications.  Will resuscitate and evaluate for surgery.  Have discussed indications for emergent laparotomy and colectomy, including hemodynamic instability and failure to improve with antibiotic therapy.  Have also discussed need for possible ostomy.  Will continue to watch  closely, pain control.

## 2015-02-28 NOTE — ED Notes (Signed)
Patient transported to CT 

## 2015-03-01 ENCOUNTER — Inpatient Hospital Stay: Payer: Managed Care, Other (non HMO)

## 2015-03-01 ENCOUNTER — Encounter
Admission: EM | Disposition: A | Discharge status: Home Health | Payer: Self-pay | Source: Home / Self Care | Attending: Surgery

## 2015-03-01 ENCOUNTER — Inpatient Hospital Stay: Payer: Managed Care, Other (non HMO) | Admitting: Anesthesiology

## 2015-03-01 ENCOUNTER — Encounter: Payer: Self-pay | Admitting: Anesthesiology

## 2015-03-01 HISTORY — PX: LAPAROTOMY: SHX154

## 2015-03-01 LAB — CBC
HCT: 39 % (ref 35.0–47.0)
Hemoglobin: 13.5 g/dL (ref 12.0–16.0)
MCH: 33.1 pg (ref 26.0–34.0)
MCHC: 34.7 g/dL (ref 32.0–36.0)
MCV: 95.4 fL (ref 80.0–100.0)
PLATELETS: 187 10*3/uL (ref 150–440)
RBC: 4.09 MIL/uL (ref 3.80–5.20)
RDW: 13.2 % (ref 11.5–14.5)
WBC: 6.1 10*3/uL (ref 3.6–11.0)

## 2015-03-01 SURGERY — LAPAROTOMY, EXPLORATORY
Anesthesia: General

## 2015-03-01 MED ORDER — HYDROMORPHONE 1 MG/ML IV SOLN
INTRAVENOUS | Status: DC
  Administered 2015-03-01: 1 mg via INTRAVENOUS
  Administered 2015-03-01 (×2): 1.6 mg via INTRAVENOUS
  Filled 2015-03-01: qty 25

## 2015-03-01 MED ORDER — SODIUM CHLORIDE 0.9 % IV BOLUS (SEPSIS)
1000.0000 mL | Freq: Once | INTRAVENOUS | Status: AC
  Administered 2015-03-01: 1000 mL via INTRAVENOUS

## 2015-03-01 MED ORDER — KETOROLAC TROMETHAMINE 30 MG/ML IJ SOLN: 30.0000 mg | Freq: Once | INTRAMUSCULAR | Status: DC

## 2015-03-01 MED ORDER — LACTATED RINGERS IV SOLN
Freq: Once | INTRAVENOUS | Status: AC
  Administered 2015-03-01: 16:00:00 via INTRAVENOUS

## 2015-03-01 MED ORDER — PHENYLEPHRINE HCL 10 MG/ML IJ SOLN: 10000.0000 ug | INTRAMUSCULAR | Status: DC | PRN

## 2015-03-01 MED ORDER — LACTATED RINGERS IV SOLN
INTRAVENOUS | Status: DC | PRN
  Administered 2015-03-01: 13:00:00 via INTRAVENOUS

## 2015-03-01 MED ORDER — PHENYLEPHRINE HCL 10 MG/ML IJ SOLN
10000.0000 ug | INTRAMUSCULAR | Status: DC | PRN
  Administered 2015-03-01: 20 ug/min via INTRAVENOUS

## 2015-03-01 MED ORDER — MIDAZOLAM HCL 2 MG/2ML IJ SOLN
INTRAMUSCULAR | Status: DC | PRN
  Administered 2015-03-01: 2 mg via INTRAVENOUS

## 2015-03-01 MED ORDER — ROCURONIUM BROMIDE 100 MG/10ML IV SOLN
INTRAVENOUS | Status: DC | PRN
  Administered 2015-03-01: 10 mg via INTRAVENOUS
  Administered 2015-03-01: 40 mg via INTRAVENOUS
  Administered 2015-03-01: 10 mg via INTRAVENOUS

## 2015-03-01 MED ORDER — KETOROLAC TROMETHAMINE 30 MG/ML IJ SOLN
30.0000 mg | Freq: Four times a day (QID) | INTRAMUSCULAR | Status: AC
  Administered 2015-03-01 – 2015-03-06 (×18): 30 mg via INTRAVENOUS
  Filled 2015-03-01 (×18): qty 1

## 2015-03-01 MED ORDER — 0.9 % SODIUM CHLORIDE (POUR BTL) OPTIME
TOPICAL | Status: DC | PRN
  Administered 2015-03-01: 2500 mL

## 2015-03-01 MED ORDER — FENTANYL CITRATE (PF) 100 MCG/2ML IJ SOLN
INTRAMUSCULAR | Status: DC | PRN
  Administered 2015-03-01: 100 ug via INTRAVENOUS
  Administered 2015-03-01: 50 ug via INTRAVENOUS
  Administered 2015-03-01: 100 ug via INTRAVENOUS

## 2015-03-01 MED ORDER — ACETAMINOPHEN 10 MG/ML IV SOLN
INTRAVENOUS | Status: DC | PRN
  Administered 2015-03-01: 1000 mg via INTRAVENOUS

## 2015-03-01 MED ORDER — PHENYLEPHRINE HCL 10 MG/ML IJ SOLN
INTRAMUSCULAR | Status: DC | PRN
Start: 2015-03-01 — End: 2015-03-01
  Administered 2015-03-01 (×3): 100 ug via INTRAVENOUS
  Administered 2015-03-01: 200 ug via INTRAVENOUS
  Administered 2015-03-01 (×3): 100 ug via INTRAVENOUS

## 2015-03-01 MED ORDER — SUCCINYLCHOLINE CHLORIDE 20 MG/ML IJ SOLN
INTRAMUSCULAR | Status: DC | PRN
  Administered 2015-03-01: 100 mg via INTRAVENOUS

## 2015-03-01 MED ORDER — SODIUM CHLORIDE 0.9 % IV SOLN
10000.0000 ug | INTRAVENOUS | Status: DC | PRN
  Administered 2015-03-01: 14:00:00 via INTRAVENOUS

## 2015-03-01 MED ORDER — FENTANYL CITRATE (PF) 100 MCG/2ML IJ SOLN
25.0000 ug | INTRAMUSCULAR | Status: DC | PRN
  Administered 2015-03-01 (×4): 25 ug via INTRAVENOUS
  Filled 2015-03-01 (×5): qty 1

## 2015-03-01 MED ORDER — SUGAMMADEX SODIUM 200 MG/2ML IV SOLN
INTRAVENOUS | Status: DC | PRN
  Administered 2015-03-01: 136 mg via INTRAVENOUS

## 2015-03-01 MED ORDER — PROMETHAZINE HCL 25 MG/ML IJ SOLN: 6.2500 mg | INTRAMUSCULAR | Status: DC | PRN

## 2015-03-01 SURGICAL SUPPLY — 38 items
BARRIER SKIN 2 1/4 (WOUND CARE) ×2 IMPLANT
BARRIER SKIN 2 1/4INCH (WOUND CARE) ×1
BULB RESERV EVAC DRAIN JP 100C (MISCELLANEOUS) ×6 IMPLANT
CANISTER SUCT 1200ML W/VALVE (MISCELLANEOUS) ×3 IMPLANT
CANISTER SUCT 3000ML PPV (MISCELLANEOUS) ×3 IMPLANT
CATH TRAY 16F METER LATEX (MISCELLANEOUS) IMPLANT
CHLORAPREP W/TINT 26ML (MISCELLANEOUS) ×3 IMPLANT
DRAIN CHANNEL JP 19F (MISCELLANEOUS) ×6 IMPLANT
DRAPE LAPAROTOMY 100X77 ABD (DRAPES) ×3 IMPLANT
DRSG OPSITE POSTOP 4X12 (GAUZE/BANDAGES/DRESSINGS) ×3 IMPLANT
ELECT CAUTERY BLADE 6.4 (BLADE) ×3 IMPLANT
GLOVE BIO SURGEON STRL SZ7 (GLOVE) ×3 IMPLANT
GLOVE BIOGEL PI IND STRL 7.0 (GLOVE) ×2 IMPLANT
GLOVE BIOGEL PI INDICATOR 7.0 (GLOVE) ×4
GLOVE PI ORTHOPRO 6.5 (GLOVE) ×4
GLOVE PI ORTHOPRO STRL 6.5 (GLOVE) ×2 IMPLANT
GOWN STRL REUS W/ TWL LRG LVL3 (GOWN DISPOSABLE) ×2 IMPLANT
GOWN STRL REUS W/TWL LRG LVL3 (GOWN DISPOSABLE) ×4
KIT RM TURNOVER STRD PROC AR (KITS) ×3 IMPLANT
LABEL OR SOLS (LABEL) ×3 IMPLANT
LIGASURE IMPACT 36 18CM CVD LR (INSTRUMENTS) ×3 IMPLANT
NS IRRIG 1000ML POUR BTL (IV SOLUTION) ×9 IMPLANT
NS IRRIG 500ML POUR BTL (IV SOLUTION) ×3 IMPLANT
PACK BASIN MAJOR ARMC (MISCELLANEOUS) ×3 IMPLANT
PACK COLON CLEAN CLOSURE (MISCELLANEOUS) ×3 IMPLANT
PAD GROUND ADULT SPLIT (MISCELLANEOUS) ×3 IMPLANT
POUCH DRAIN  2 1/4 MED RED 181 (OSTOMY) ×3 IMPLANT
RELOAD PROXIMATE 75MM BLUE (ENDOMECHANICALS) ×6 IMPLANT
SPONGE DRAIN TRACH 4X4 STRL 2S (GAUZE/BANDAGES/DRESSINGS) ×3 IMPLANT
SPONGE LAP 18X18 5 PK (GAUZE/BANDAGES/DRESSINGS) ×3 IMPLANT
STAPLER PROXIMATE 75MM BLUE (STAPLE) ×3 IMPLANT
STAPLER SKIN PROX 35W (STAPLE) ×3 IMPLANT
SUT ETHILON 2 0 FS 18 (SUTURE) ×6 IMPLANT
SUT PDS AB 1 TP1 96 (SUTURE) ×6 IMPLANT
SUT SILK 3-0 (SUTURE) ×3 IMPLANT
SUT VIC AB 0 CT2 27 (SUTURE) ×3 IMPLANT
SUT VICRYL 3-0 CR8 SH (SUTURE) ×3 IMPLANT
SUT VICRYL+ 3-0 144IN (SUTURE) IMPLANT

## 2015-03-01 NOTE — Brief Op Note (Signed)
02/27/2015 - 03/01/2015  7:23 PM  PATIENT:  Natalie [name]  59 y.o. female  PRE-OPERATIVE DIAGNOSIS:  diverticulitis with perforation  POST-OPERATIVE DIAGNOSIS:  diverticulitis with perforation  PROCEDURE:  Procedure(s): EXPLORATORY LAPAROTOMY, SIGMOID COLECTOMY, COLOSTOMY (N/A)  SURGEON:  Surgeon(s) and Role:    * Hubbard Robinson, MD - Primary  PHYSICIAN ASSISTANT: none   ASSISTANTS: none   ANESTHESIA:   general  EBL:  100cc  BLOOD ADMINISTERED:none  DRAINS: (2) Jackson-Pratt drain(s) with closed bulb suction in the pelvis and left pericolic gutter   LOCAL MEDICATIONS USED:  MARCAINE     SPECIMEN:  Source of Specimen:  Sigmoid colon, fallopian tube mass  DISPOSITION OF SPECIMEN:  PATHOLOGY  COUNTS:  YES  TOURNIQUET:  * No tourniquets in log *  DICTATION: .Dragon Dictation  PLAN OF CARE: Admit to inpatient   PATIENT DISPOSITION:  PACU - hemodynamically stable.   Delay start of Pharmacological VTE agent (>24hrs) due to surgical blood loss or risk of bleeding: no

## 2015-03-01 NOTE — Transfer of Care (Signed)
Immediate Anesthesia Transfer of Care Note  Patient: Natalie [name]  Procedure(s) Performed: Procedure(s): EXPLORATORY LAPAROTOMY, SIGMOID COLECTOMY, COLOSTOMY (N/A)  Patient Location: PACU  Anesthesia Type:General  Level of Consciousness: patient cooperative and lethargic  Airway & Oxygen Therapy: Patient Spontanous Breathing and Patient connected to face mask oxygen  Post-op Assessment: Report given to RN and Post -op Vital signs reviewed and stable  Post vital signs: Reviewed and stable  Last Vitals:  Filed Vitals:   03/01/15 0800 03/01/15 1554  BP:  101/59  Pulse:  101  Temp:  37.6 C  Resp: 19 18    Complications: No apparent anesthesia complications

## 2015-03-01 NOTE — Progress Notes (Signed)
  Called to patient bedside due to increased pain. Patient state that she was using the restroom when her pain increased and did not come back down. Denies any nausea at this time.  PE:  GEN: Obviously uncomfortable Resp: Tachypnea Heart: Tachycardic Abd: Unchanged from earlier on shift, continued tenderness to R>L lower quadrants, mild lower abdominal distention but not in upper abdomen  A/P: 59 year old female with diverticulitis Pain has worsened Will increase PCA, add toradol and check abdominal series If air has increased or If unchanged will attempt to obtain better pain control, if unable to control pain she may require urgent operative intervention  Clayburn Pert, MD [facility] General Surgeon [facility] Surgical 03/01/2015 @0300

## 2015-03-01 NOTE — Anesthesia Postprocedure Evaluation (Signed)
Anesthesia Post Note  Patient: Caidynce Hruska Beebe  Procedure(s) Performed: Procedure(s) (LRB): EXPLORATORY LAPAROTOMY, SIGMOID COLECTOMY, COLOSTOMY (N/A)  Patient location during evaluation: PACU Anesthesia Type: General Level of consciousness: awake and alert Pain management: satisfactory to patient Vital Signs Assessment: post-procedure vital signs reviewed and stable Respiratory status: spontaneous breathing, nonlabored ventilation, respiratory function stable and patient connected to nasal cannula oxygen Cardiovascular status: blood pressure returned to baseline and stable Postop Assessment: No signs of nausea or vomiting Anesthetic complications: no    Last Vitals:  Filed Vitals:   03/01/15 1816 03/01/15 1827  BP:  105/49  Pulse:  102  Temp:  36.8 C  Resp: 20 17    Last Pain:  Filed Vitals:   03/01/15 2000  PainSc: 3                  Martha Clan

## 2015-03-01 NOTE — Progress Notes (Signed)
59 yr old with acute diveriticulitis with perforation.  Patient states having much worse pain.  She states that the pain medication is no longer working, she cannot get a deep breath and now she feels as if she cannot move because of the pain.    Filed Vitals:   03/01/15 0601 03/01/15 0800  BP: 90/50   Pulse: 121   Temp: 98.7 F (37.1 C)   Resp: 21 19   PE:  Gen: moderate distress Res: tachypenic  Cardio: Tachycardic Abd: rebound tenderness, peritoneal with increased distension throughout   A/P:  I discussed the risks, benefits and expected outcome with patient and family, she is now peritoneal, tachypneic and tachycardic, and hypotensive.  She is getting a bolus of fluid now.  Explained that she needs exploratory laparotomy, resection of colon and possible ostomy. I discussed risk of bleeding, infection, possible injury to surrounding structures, perforation of viscus, ostomy placement, need for further procedures, need for drain placement.  Patient and family given the opportunity to ask questions and have them answered, consent obtained.

## 2015-03-01 NOTE — Anesthesia Procedure Notes (Signed)
Procedure Name: Intubation Date/Time: 03/01/2015 12:39 PM Performed by: Jonna Clark Pre-anesthesia Checklist: Patient identified, Patient being monitored, Timeout performed, Emergency Drugs available and Suction available Patient Re-evaluated:Patient Re-evaluated prior to inductionOxygen Delivery Method: Circle system utilized Preoxygenation: Pre-oxygenation with 100% oxygen Intubation Type: IV induction Ventilation: Mask ventilation without difficulty Laryngoscope Size: Miller and 2 Grade View: Grade I Tube type: Oral Tube size: 7.0 mm Number of attempts: 1 Placement Confirmation: ETT inserted through vocal cords under direct vision,  positive ETCO2 and breath sounds checked- equal and bilateral Secured at: 22 cm Tube secured with: Tape Dental Injury: Teeth and Oropharynx as per pre-operative assessment

## 2015-03-01 NOTE — Progress Notes (Signed)
Dr. Adonis Huguenin notified of pt's increasing pain after using the bathroom earlier. Lower abdomen slightly distended. Pulse in 130s and Resps in 30s. PCA seems to not help pain at the moment.  Pt also requesting to see surgeon.  MD stated he will come see pt in a few moments and will get toradol on board for pt.  Will cont to monitor.

## 2015-03-01 NOTE — Plan of Care (Signed)
Problem: Pain Managment: Goal: General experience of comfort will improve Outcome: Progressing PCA pump     Problem: Nutrition: Goal: Adequate nutrition will be maintained Outcome: Not Progressing Pt is NPO  Problem: Bowel/Gastric: Goal: Will not experience complications related to bowel motility Outcome: Not Progressing No flatus; abdominal tenderness

## 2015-03-01 NOTE — Anesthesia Preprocedure Evaluation (Addendum)
Anesthesia Evaluation  Patient identified by MRN, date of birth, ID band Patient awake    Reviewed: Allergy & Precautions, H&P , NPO status , Patient's Chart, lab work & pertinent test results, reviewed documented beta blocker date and time   History of Anesthesia Complications Negative for: history of anesthetic complications  Airway Mallampati: I  TM Distance: >3 FB Neck ROM: full    Dental  (+) Partial Upper, Teeth Intact   Pulmonary neg pulmonary ROS,    Pulmonary exam normal breath sounds clear to auscultation       Cardiovascular Exercise Tolerance: Good negative cardio ROS Normal cardiovascular exam Rhythm:regular Rate:Normal     Neuro/Psych negative neurological ROS  negative psych ROS   GI/Hepatic Neg liver ROS, neg GERD  ,H/o diverticulitis   Endo/Other  negative endocrine ROS  Renal/GU negative Renal ROS  negative genitourinary   Musculoskeletal   Abdominal   Peds  Hematology negative hematology ROS (+)   Anesthesia Other Findings Past Medical History:   Blood in stool                                  2/20016      Diverticulitis                                               UTI (lower urinary tract infection)                          Chronic sinusitis                                           History of diverticulitis with known perforation treated with antibiotics, however acutely decompensated and now with peritonitis concerning for an acute abdomen.  Will emergently take to the OR.  Patient has been NPO for 4 days with no recent nausea or vomiting, but will perform RSI and place an NGT.  Reproductive/Obstetrics negative OB ROS                            Anesthesia Physical Anesthesia Plan  ASA: I and emergent  Anesthesia Plan: General, Rapid Sequence and Cricoid Pressure   Post-op Pain Management:    Induction:   Airway Management Planned:   Additional Equipment:    Intra-op Plan:   Post-operative Plan:   Informed Consent: I have reviewed the patients History and Physical, chart, labs and discussed the procedure including the risks, benefits and alternatives for the proposed anesthesia with the patient or authorized representative who has indicated his/her understanding and acceptance.   Dental Advisory Given  Plan Discussed with: Anesthesiologist, CRNA and Surgeon  Anesthesia Plan Comments:         Anesthesia Quick Evaluation

## 2015-03-02 ENCOUNTER — Encounter: Payer: Self-pay | Admitting: Surgery

## 2015-03-02 DIAGNOSIS — R69 Illness, unspecified: Secondary | ICD-10-CM

## 2015-03-02 DIAGNOSIS — K5732 Diverticulitis of large intestine without perforation or abscess without bleeding: Secondary | ICD-10-CM | POA: Insufficient documentation

## 2015-03-02 MED ORDER — ACETAMINOPHEN 500 MG PO TABS
1000.0000 mg | ORAL_TABLET | Freq: Four times a day (QID) | ORAL | Status: DC
  Administered 2015-03-02 (×2): 1000 mg via ORAL
  Filled 2015-03-02 (×5): qty 2

## 2015-03-02 MED ORDER — DEXTROSE-NACL 5-0.45 % IV SOLN
INTRAVENOUS | Status: DC
  Administered 2015-03-02 – 2015-03-03 (×2): via INTRAVENOUS

## 2015-03-02 MED ORDER — HYDROMORPHONE HCL 1 MG/ML IJ SOLN
1.0000 mg | INTRAMUSCULAR | Status: DC | PRN
  Administered 2015-03-02 – 2015-03-14 (×16): 1 mg via INTRAVENOUS
  Filled 2015-03-02 (×18): qty 1

## 2015-03-02 MED ORDER — CYCLOBENZAPRINE HCL 10 MG PO TABS
10.0000 mg | ORAL_TABLET | Freq: Three times a day (TID) | ORAL | Status: DC
Start: 2015-03-02 — End: 2015-03-16
  Administered 2015-03-02 – 2015-03-16 (×38): 10 mg via ORAL
  Filled 2015-03-02 (×41): qty 1

## 2015-03-02 NOTE — Consult Note (Signed)
WOC ostomy consult note Stoma type/location: LLQ colostomy Stomal assessment/size: 1 and 1/8 x 1 and 3/4 inch oval, red, moist, edematous in a mild depression circumferentially. Peristomal assessment: Intact, clear Treatment options for stomal/peristomal skin: Skin barrier ring for filling of defect (depression) Output Scant serosanguinous effluent Ostomy pouching: 2pc., 2 and 3/4 inch pouching system with skin barrier ring Education provided: Patient is provided with an educational booklet.  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  Her brother had diverticular disease and surgery, but was reanastomosed at time of surgery. She is confident that she will be able to learn self care in time.  Recommend HHRN for support at home while she is learning.  [facility] Nurse will visit again on Sunday. Enrolled patient in [place] program: No WOC nursing team will follow, and will remain available to this patient, the nursing, surgical and medical teams.   Thanks, Maudie Flakes, MSN, RN, [place], Arther Abbott  Pager# [phone]

## 2015-03-02 NOTE — Progress Notes (Signed)
59 yr old female with perforated diverticulitis POD#1 from Ex-Lap sigmoidectomy and colostomy creation.  She is doing better today.  She states pain well controlled but hurts when coughing.  She would like to get up and move around.   Filed Vitals:   03/02/15 0416 03/02/15 1201  BP: 113/69 117/64  Pulse: 107 115  Temp: 97.7 F (36.5 C) 98.1 F (36.7 C)  Resp: 18 17   I/O last 3 completed shifts: In: 6771.6 [I.V.:6600.6; NG/GT:60; IV Piggyback:111] Out: 2060 [Urine:1475; Emesis/NG output:200; Drains:285; Blood:100] Total I/O In: 472.9 [I.V.:322.9; IV Piggyback:150] Out: 160 [Urine:130; Drains:30]  PE:  Gen: NAD,AAox3 Res: crackles in bilateral bases Cardio: slightly tachycardic but regular rhythm Abd: soft, appropirately tender, incision clean and dry, colostomy pink healthy tissue, only some bowel sweat in bags.  JP drains serosanginous Ext: 1+ edema, good pulses  CBC Latest Ref Rng 03/01/2015 02/28/2015 02/27/2015  WBC 3.6 - 11.0 K/uL 6.1 10.2 14.0(H)  Hemoglobin 12.0 - 16.0 g/dL 13.5 13.9 14.1  Hematocrit 35.0 - 47.0 % 39.0 41.1 42.2  Platelets 150 - 440 K/uL 187 205 245    CMP Latest Ref Rng 02/28/2015 02/27/2015  Glucose 65 - 99 mg/dL 112(H) 170(H)  BUN 6 - 20 mg/dL 8 11  Creatinine 0.44 - 1.00 mg/dL 0.63 0.73  Sodium 135 - 145 mmol/L 141 141  Potassium 3.5 - 5.1 mmol/L 3.7 3.1(L)  Chloride 101 - 111 mmol/L 112(H) 106  CO2 22 - 32 mmol/L 21(L) 26  Calcium 8.9 - 10.3 mg/dL 8.0(L) 9.0  Total Protein 6.5 - 8.1 g/dL - 7.2  Total Bilirubin 0.3 - 1.2 mg/dL - 1.0  Alkaline Phos 38 - 126 U/L - 68  AST 15 - 41 U/L - 22  ALT 14 - 54 U/L - 14    A/P: 59 yr old female with perforated diverticulitis POD#1 from Ex-Lap sigmoidectomy and colostomy creation --continue pain control with IV toradol and Dilaudid, flexeril for muscle spasm --d/c NG tube today, ice chips and sips of water  --ambulate in hallway and sit in chair today --Will continue foley for strict UOP monitoring,  starting to clear up slightly --WOCN consult for new ostomy

## 2015-03-02 NOTE — Op Note (Signed)
Exploratory Laparotomy, Sigmoidectomy and colostomy Procedure Note  Indications: This patient presents with acute perforated diverticulitis, initially improving on antibiotic but acutely worsened last night.  Today when examined she was tachypnic, tachycardic, hypotensive and peritoneal.   Pre-operative Diagnosis:  1.  Perforated diverticulitis  2. Peritonitis  Post-operative Diagnosis: same  Surgeon: Hubbard Robinson   Assistants: none   Anesthesia: General endotracheal anesthesia  ASA Class: 3  Procedure Details  The risks, benefits, complications, treatment options, and expected outcomes were discussed with the patient. The possibilities of reaction to medication, pulmonary aspiration, perforation of viscus, bleeding, recurrent infection, finding a normal colon, the need for additional procedures, need for ostomy placement, failure to diagnose a condition, and creating a complication requiring transfusion or operation were discussed with the patient. The patient concurred with the proposed plan, giving informed consent.   The patient was taken to the operating room and identified as Natalie [name] and the procedure verified as Exploratory laparotomy possible ostomy placement. A Time Out was held and the above information confirmed.  The patient was brought to the operating room and placed supine. After induction of a general anesthetic, a Foley catheter was inserted and the abdomen was prepped and draped in standard fashion.  A 10 blade scalpel was used to make and incision from above the umbilicus to just above the pubic symphysis in the midline.  Bovie electrocautery was used to dissect down to fascia.  Fascia was incised and peritoneum was elevated up.  Peitoneum was incised with scissors and an immediate rush of gas and stool came out of the wound.  The fascia was opened along the length of the incision. A bookwalter retractor was used to obtain better visualization.    The sigmoid  colon had 3cm perforation with a stool ball stuck in the sidewall at the spot.  The was also feculant material throughout the abdomen and the beginning of abscess formation along the right paracolic gutter.  The sigmoid colon was mobilized to soft colon at the rectosigmoid junction and hemostat used to make a mesenteric incision just below the colon wall.  75 GIA stapler was used to resect the colon at this level.   The descending colon and splenic flexure were then mobilized with gentle retraction of the colon in a medial direction with mobilization of the peritoneal reflection with cautery. Mobilization of this area was complete to expose the retroperitoneum. The ureter was not identified during this mobilization process but no structures were divided during this mobilization. There was no blood loss during this portion of the procedure.    After completing mobilization, the colon was resected with a 33mm GIA stapler. The mesentery was resected using the Ligasure close to the colon wall. The specimen was then removed and submitted to pathology.  During the resection it was noted that the left fallopian tube was attached to a diverticulum of the colon wall via a cystic structure.  The left ovary was below and appeared normal.   Carefull sharp dissection was used to dissect the cystic like mass from the fallopian tube and colon and sent to pathology as well.    At this time bookwalter retractor was taken down and left lower quadrant circular incision was made.  Dissection was carried down to the fascia and cruciate incision made in the fascia until entered into the abdominal cavity.  The descending colon was then attempted to bring up through the incision but due to multiple hard stool was not able to.  Staple line was then resected and bowel manipulate to clean descending and part of transverse colon from multiple hard stool balls. The colon was then easily above to be elevated through the LLQ incision site.   3-0 vicryl was then used through the anterior fascia, colon and then skin edge create the colostomy in the Brooke technique.   Abdomen was copiusly irrragted with about 4 L saline.  Two 43F JP drains were then placed on the trochars through the skin the right lower quadrant.  The ends of the inferior JP was placed in the pelvis and Left colic gutter. And superior JP placed in the right colic abscess cavity and into the pelvis behind the uterus.   The abdominal fascia was then closed using running 0-PDS looped sutures, one from superior and one from inferior to completely close the abdominal wall.  Wound was then irrigated out and loosely closed with staples.  Sterile dressings were applied and ostomy appliance placed.   Instrument, sponge, and needle counts were correct prior to abdominal closure and at the conclusion of the case.   Findings: Large sigmoid perforation   Estimated Blood Loss: less than 100 mL         Drains: 2 JP in pelvis         Total IV Fluids:  2500 mL         Specimens: Sigmoid colon and left fallopian mass       Complications: None; patient tolerated the procedure well.         Disposition: PACU - hemodynamically stable.         Condition: stable

## 2015-03-02 NOTE — Progress Notes (Signed)
Pt c/o restlessness d/t PCA pump continuously going off. She states " I'm not pushing the button, can I just get off the pump?".  Dr. Adonis Huguenin notified of requests and stated d/c PCA and start dilaudid 1mg  q4h prn.

## 2015-03-02 NOTE — Plan of Care (Signed)
Problem: Pain Managment: Goal: General experience of comfort will improve Outcome: Progressing PCA d/c'd  Problem: Activity: Goal: Risk for activity intolerance will decrease Outcome: Not Progressing Will encourage ambulation     Problem: Nutrition: Goal: Adequate nutrition will be maintained Outcome: Not Progressing Pt is NPO  Problem: Bowel/Gastric: Goal: Will not experience complications related to bowel motility Outcome: Not Progressing Colostomy in place     Problem: Activity: Goal: Ability to tolerate increased activity will improve Outcome: Not Progressing Will encourage ambulation     Problem: Bowel/Gastric: Goal: Gastrointestinal status for postoperative course will improve Outcome: Not Progressing Colostomy in place     Problem: Nutritional: Goal: Ability to attain and maintain optimal nutritional status will improve Outcome: Not Progressing Pt is npo

## 2015-03-03 LAB — BASIC METABOLIC PANEL
Anion gap: 3 — ABNORMAL LOW (ref 5–15)
BUN: 21 mg/dL — AB (ref 6–20)
CHLORIDE: 116 mmol/L — AB (ref 101–111)
CO2: 23 mmol/L (ref 22–32)
CREATININE: 0.77 mg/dL (ref 0.44–1.00)
Calcium: 7.6 mg/dL — ABNORMAL LOW (ref 8.9–10.3)
GFR calc Af Amer: >60 mL/min (ref >60)
GFR calc non Af Amer: >60 mL/min (ref >60)
GLUCOSE: 110 mg/dL — AB (ref 65–99)
POTASSIUM: 2.9 mmol/L — AB (ref 3.5–5.1)
SODIUM: 142 mmol/L (ref 135–145)

## 2015-03-03 LAB — CBC
HCT: 34.2 % — ABNORMAL LOW (ref 35.0–47.0)
HEMOGLOBIN: 11.6 g/dL — AB (ref 12.0–16.0)
MCH: 32 pg (ref 26.0–34.0)
MCHC: 33.9 g/dL (ref 32.0–36.0)
MCV: 94.2 fL (ref 80.0–100.0)
Platelets: 222 10*3/uL (ref 150–440)
RBC: 3.63 MIL/uL — ABNORMAL LOW (ref 3.80–5.20)
RDW: 13.5 % (ref 11.5–14.5)
WBC: 17.5 10*3/uL — ABNORMAL HIGH (ref 3.6–11.0)

## 2015-03-03 MED ORDER — KCL IN DEXTROSE-NACL 40-5-0.45 MEQ/L-%-% IV SOLN
INTRAVENOUS | Status: DC
  Administered 2015-03-03 (×2): via INTRAVENOUS
  Filled 2015-03-03 (×4): qty 1000

## 2015-03-03 MED ORDER — POTASSIUM CHLORIDE 10 MEQ/100ML IV SOLN
10.0000 meq | INTRAVENOUS | Status: AC
  Administered 2015-03-03 (×4): 10 meq via INTRAVENOUS
  Filled 2015-03-03 (×4): qty 100

## 2015-03-03 NOTE — Care Management Note (Signed)
Case Management Note  Patient Details  Name: Natalie [name] MRN: [id number] Date of Birth: 03-Mar-1956  Subjective/Objective:    [name], a new ostomy patient, was seen by the [facility] on 03/02/15, and WOCN left note that she will visit [name] again on Sunday 03/04/15. [name] continues to receive IV Toradol and Dilaudid but is increasing her activity level.                 Action/Plan:   Expected Discharge Date:                  Expected Discharge Plan:     In-House Referral:     Discharge planning Services     Post Acute Care Choice:    Choice offered to:     DME Arranged:    DME Agency:     HH Arranged:    [facility] Agency:     Status of Service:     Medicare Important Message Given:    Date Medicare IM Given:    Medicare IM give by:    Date Additional Medicare IM Given:    Additional Medicare Important Message give by:     If discussed at [place] of Stay Meetings, dates discussed:    Additional Comments:  Json Koelzer A, RN 03/03/2015, 3:04 PM

## 2015-03-03 NOTE — Progress Notes (Signed)
Dr. Gwendolyn Grant notified of pt Potassium of 2.9.  Dr. Gwendolyn Grant to place orders. Primary nurse to continue to monitor.

## 2015-03-03 NOTE — Progress Notes (Signed)
59 yr old female with perforated diverticulitis POD#2 from Ex-Lap sigmoidectomy and colostomy creation.  She is feeling very tired today.  She waited too long before asking for pain medication last night, so go behind on pain and didn't sleep well.  She is not in much pain at this time.  She did get up and sit in chair yesterday.    Filed Vitals:   03/02/15 2008 03/03/15 0415  BP: 116/52 123/58  Pulse: 106 98  Temp: 98.9 F (37.2 C) 98 F (36.7 C)  Resp: 19 18   I/O last 3 completed shifts: In: 5471.6 [I.V.:5108.6; NG/GT:60; IV Piggyback:303] Out: [id number] [Urine:1105; Emesis/NG output:200; Drains:502] Total I/O In: 127.7 [Other:120; IV Piggyback:7.7] Out: -   PE:  Gen: NAD,AAox3 Res: crackles in bilateral bases Cardio: regular rate and rhythm  Abd: soft, appropirately tender, incision clean and dry, colostomy pink healthy tissue, only some bowel sweat in bags.  JP drains serosanguinous  Ext: 1+ edema, good pulses  CBC Latest Ref Rng 03/03/2015 03/01/2015 02/28/2015  WBC 3.6 - 11.0 K/uL 17.5(H) 6.1 10.2  Hemoglobin 12.0 - 16.0 g/dL 11.6(L) 13.5 13.9  Hematocrit 35.0 - 47.0 % 34.2(L) 39.0 41.1  Platelets 150 - 440 K/uL 222 187 205    CMP Latest Ref Rng 03/03/2015 02/28/2015 02/27/2015  Glucose 65 - 99 mg/dL 110(H) 112(H) 170(H)  BUN 6 - 20 mg/dL 21(H) 8 11  Creatinine 0.44 - 1.00 mg/dL 0.77 0.63 0.73  Sodium 135 - 145 mmol/L 142 141 141  Potassium 3.5 - 5.1 mmol/L 2.9(LL) 3.7 3.1(L)  Chloride 101 - 111 mmol/L 116(H) 112(H) 106  CO2 22 - 32 mmol/L 23 21(L) 26  Calcium 8.9 - 10.3 mg/dL 7.6(L) 8.0(L) 9.0  Total Protein 6.5 - 8.1 g/dL - - 7.2  Total Bilirubin 0.3 - 1.2 mg/dL - - 1.0  Alkaline Phos 38 - 126 U/L - - 68  AST 15 - 41 U/L - - 22  ALT 14 - 54 U/L - - 14    A/P: 59 yr old female with perforated diverticulitis POD#2 from Ex-Lap sigmoidectomy and colostomy creation --continue pain control with IV toradol and Dilaudid, flexeril for muscle spasm --advance to clear  liquids today --ambulate in hallway and sit in chair today --discontinue foley today --Appreciate WOCN consult  --Hypokalemia--IV replacement today will recheck in AM.

## 2015-03-04 LAB — BASIC METABOLIC PANEL
ANION GAP: 6 (ref 5–15)
BUN: 16 mg/dL (ref 6–20)
CALCIUM: 7.6 mg/dL — AB (ref 8.9–10.3)
CHLORIDE: 110 mmol/L (ref 101–111)
CO2: 22 mmol/L (ref 22–32)
Creatinine, Ser: 0.7 mg/dL (ref 0.44–1.00)
GFR calc non Af Amer: >60 mL/min (ref >60)
Glucose, Bld: 136 mg/dL — ABNORMAL HIGH (ref 65–99)
POTASSIUM: 3.5 mmol/L (ref 3.5–5.1)
Sodium: 138 mmol/L (ref 135–145)

## 2015-03-04 LAB — CBC
HEMATOCRIT: 34.9 % — AB (ref 35.0–47.0)
HEMOGLOBIN: 11.7 g/dL — AB (ref 12.0–16.0)
MCH: 31.6 pg (ref 26.0–34.0)
MCHC: 33.4 g/dL (ref 32.0–36.0)
MCV: 94.5 fL (ref 80.0–100.0)
Platelets: 238 10*3/uL (ref 150–440)
RBC: 3.69 MIL/uL — ABNORMAL LOW (ref 3.80–5.20)
RDW: 13.5 % (ref 11.5–14.5)
WBC: 17.4 10*3/uL — AB (ref 3.6–11.0)

## 2015-03-04 LAB — POTASSIUM: POTASSIUM: 5.7 mmol/L — AB (ref 3.5–5.1)

## 2015-03-04 MED ORDER — SALINE SPRAY 0.65 % NA SOLN: 1.0000 | NASAL | Status: DC | PRN

## 2015-03-04 MED ORDER — OXYCODONE-ACETAMINOPHEN 7.5-325 MG PO TABS
1.0000 | ORAL_TABLET | ORAL | Status: DC | PRN
  Administered 2015-03-04 – 2015-03-07 (×9): 1 via ORAL
  Administered 2015-03-08 (×2): 2 via ORAL
  Administered 2015-03-08 – 2015-03-09 (×4): 1 via ORAL
  Administered 2015-03-09 – 2015-03-10 (×2): 2 via ORAL
  Administered 2015-03-10: 1 via ORAL
  Administered 2015-03-10 – 2015-03-12 (×4): 2 via ORAL
  Administered 2015-03-12: 1 via ORAL
  Administered 2015-03-13 – 2015-03-16 (×5): 2 via ORAL
  Filled 2015-03-04: qty 2
  Filled 2015-03-04 (×2): qty 1
  Filled 2015-03-04 (×2): qty 2
  Filled 2015-03-04 (×2): qty 1
  Filled 2015-03-04: qty 2
  Filled 2015-03-04 (×4): qty 1
  Filled 2015-03-04: qty 2
  Filled 2015-03-04: qty 1
  Filled 2015-03-04: qty 2
  Filled 2015-03-04: qty 1
  Filled 2015-03-04 (×2): qty 2
  Filled 2015-03-04: qty 1
  Filled 2015-03-04 (×2): qty 2
  Filled 2015-03-04: qty 1
  Filled 2015-03-04: qty 2
  Filled 2015-03-04: qty 1
  Filled 2015-03-04 (×5): qty 2
  Filled 2015-03-04: qty 1

## 2015-03-04 MED ORDER — FLUTICASONE PROPIONATE 50 MCG/ACT NA SUSP
2.0000 | Freq: Every day | NASAL | Status: DC
  Administered 2015-03-04 – 2015-03-16 (×9): 2 via NASAL
  Filled 2015-03-04 (×2): qty 16

## 2015-03-04 NOTE — Consult Note (Signed)
WOC ostomy follow up Stoma type/location: LLQ colostomy Stomal assessment/size: 1 and 3/8 inches x 1 and 3/4 inch oval, edematous, red, moist.  No function. Peristomal assessment: intact, clear with a depression circumferentially Treatment options for stomal/peristomal skin: skin barrier ring Output scant mucus in pouch applied Friday Ostomy pouching: 2pc., 2 and 1/4 inch pouching system with skin barrier ring Education provided: Extended session with spouse and granddaughter present.  A&P, pouch characteristics, stoma characteristics, pouch removal, pouch preparation , stoma sizing, ordering supplies taught.  Questions answered regarding pouch change frequency, emptying.  Demonstration (simulated) of pouch emptying and cleaning with a toilet paper "wick".  Patient able to perform Lock and Roll closure. Has book and 1-page teaching sheet. Enrolled patient in [place] Start Discharge program: Yes.  4 skin barriers (ceraplus), rings (ceraPlus), opaque pouches with gas filter and lock and roll closure requested.  Recommend HHRN for a few visits to reinforce teaching and assist with resizing of stoma aperture. If you agree, please order. [place] nursing team  will remain available to this patient, the nursing and medical teams.   Thanks, Maudie Flakes, MSN, RN, [place], Arther Abbott  Pager# [phone]

## 2015-03-04 NOTE — Progress Notes (Signed)
59 yr old female with perforated diverticulitis POD#3 from Ex-Lap sigmoidectomy and colostomy creation.  She's doing well today, sitting up in chair.  Clear liquids went well, she had some gas out of colostomy bag.   Filed Vitals:   03/03/15 2118 03/04/15 0432  BP: 132/57 137/69  Pulse: 123 108  Temp: 99.2 F (37.3 C) 98 F (36.7 C)  Resp:  17   I/O last 3 completed shifts: In: 5122.7 [P.O.:1280; I.V.:3343; Other:270; IV Piggyback:229.7] Out: 1162 [Urine:950; Drains:212] Total I/O In: 238 [I.V.:238] Out: 220 [Urine:200; Drains:20]   PE:  Gen: NAD, sitting in chair Res: crackles in bilateral bases Cardio: slight tachycardia Abd; soft, appropriately tender, incision c/d/i, JP drains with serous drainage minimal output Ext: 1+ edema, good pulses  CBC Latest Ref Rng 03/04/2015 03/03/2015 03/01/2015  WBC 3.6 - 11.0 K/uL 17.4(H) 17.5(H) 6.1  Hemoglobin 12.0 - 16.0 g/dL 11.7(L) 11.6(L) 13.5  Hematocrit 35.0 - 47.0 % 34.9(L) 34.2(L) 39.0  Platelets 150 - 440 K/uL 238 222 187   CMP Latest Ref Rng 03/04/2015 03/04/2015 03/03/2015  Glucose 65 - 99 mg/dL 136(H) - 110(H)  BUN 6 - 20 mg/dL 16 - 21(H)  Creatinine 0.44 - 1.00 mg/dL 0.70 - 0.77  Sodium 135 - 145 mmol/L 138 - 142  Potassium 3.5 - 5.1 mmol/L 3.5 5.7(H) 2.9(LL)  Chloride 101 - 111 mmol/L 110 - 116(H)  CO2 22 - 32 mmol/L 22 - 23  Calcium 8.9 - 10.3 mg/dL 7.6(L) - 7.6(L)  Total Protein 6.5 - 8.1 g/dL - - -  Total Bilirubin 0.3 - 1.2 mg/dL - - -  Alkaline Phos 38 - 126 U/L - - -  AST 15 - 41 U/L - - -  ALT 14 - 54 U/L - - -    A/P: 59 yr old female with perforated diverticulitis POD#3 from Ex-Lap sigmoidectomy and colostomy creation --continue pain control with IV toradol and Dilaudid, flexeril for muscle spasm --advance to full liquids --ambulate in hallway and sit in chair today --Appreciate WOCN consult  --Hypokalemia--resolved with replacement

## 2015-03-05 ENCOUNTER — Inpatient Hospital Stay: Payer: Managed Care, Other (non HMO)

## 2015-03-05 LAB — SURGICAL PATHOLOGY

## 2015-03-05 MED ORDER — FUROSEMIDE 10 MG/ML IJ SOLN
20.0000 mg | Freq: Once | INTRAMUSCULAR | Status: AC
  Administered 2015-03-05: 20 mg via INTRAVENOUS
  Filled 2015-03-05: qty 2

## 2015-03-05 NOTE — Consult Note (Signed)
WOC ostomy follow up Stoma type/location: LLQ Colostomy Stomal assessment/size: oval, pouch intact today.  Was changed yesterday Peristomal assessment: not assessed Treatment options for stomal/peristomal skin: barrier ring Output none at this time.  Blood tinged liquid in the pouch Ostomy pouching: 2pc. 2 1/4" with barrier ring Education provided: Patient sitting up today.  Pouch is intact.  She has reviewed her educational materials and is feeling more comfortable with the thoughts of self care.  We will perform a pouch change tomorrow.   I brought a sample of a 1 piece pouch and we discussed the 1 piece vs 2 piece.  1 piece may offer a more low profile and increased flexibility.  We may apply a 1 piece tomorrow.  She will let me know.  She is able to simulate emptying today.  Enrolled patient in [place] Start Discharge program: Yes [place] team will follow.  Domenic Moras RN BSN [place] Pager [phone]

## 2015-03-05 NOTE — Progress Notes (Signed)
Initial Nutrition Assessment       INTERVENTION:  Meals and snacks: Cater to pt preferences Nutrition diet education: Discussed colostomy medical nutrition therapy with pt.  Materials provided.  Pt verbalized understanding and expect good compliance   NUTRITION DIAGNOSIS:   Food and nutrition related knowledge deficit related to acute illness as evidenced by  (new colostomy).    GOAL:   Patient will meet greater than or equal to 90% of their needs    MONITOR:    (Energy intake, Digestive system)  REASON FOR ASSESSMENT:   LOS    ASSESSMENT:      Pt s/p hartmann's procedure with colostomy secondary to diverticulitis  Past Medical History  Diagnosis Date  . Blood in stool 2/20016  . Diverticulitis   . UTI (lower urinary tract infection)   . Chronic sinusitis     Current Nutrition: eating well per pt but not as well today secondary to shortness of breath.    Food/Nutrition-Related History: eating well prior to admission   Scheduled Medications:  . antiseptic oral rinse  7 mL Mouth Rinse BID  . cyclobenzaprine  10 mg Oral TID  . fluticasone  2 spray Each Nare Daily  . heparin  5,000 Units Subcutaneous 3 times per day  . ketorolac  30 mg Intravenous 4 times per day  . pantoprazole (PROTONIX) IV  40 mg Intravenous QHS  . piperacillin-tazobactam (ZOSYN)  IV  3.375 g Intravenous 3 times per day       Electrolyte/Renal Profile and Glucose Profile:   Recent Labs Lab 02/28/15 0841 03/03/15 0600 03/04/15 0608 03/04/15 0830  NA 141 142  --  138  K 3.7 2.9* 5.7* 3.5  CL 112* 116*  --  110  CO2 21* 23  --  22  BUN 8 21*  --  16  CREATININE 0.63 0.77  --  0.70  CALCIUM 8.0* 7.6*  --  7.6*  GLUCOSE 112* 110*  --  136*   Protein Profile:   Recent Labs Lab 02/27/15 2323  ALBUMIN 4.2    Gastrointestinal Profile: Last BM: new ostomy     Weight Change: stable wt per pt    Diet Order:  Diet full liquid Room service appropriate?: Yes; Fluid  consistency:: Thin  Skin:   reviewed   Height:   Ht Readings from Last 1 Encounters:  02/27/15 5' 4.5" (1.638 m)    Weight:   Wt Readings from Last 1 Encounters:  02/27/15 150 lb (68.04 kg)    Ideal Body Weight:     BMI:  Body mass index is 25.36 kg/(m^2).   EDUCATION NEEDS:   Education needs addressed  LOW Care Level  Emmalyne Giacomo B. Zenia Resides, [place], [place] (pager)

## 2015-03-05 NOTE — Progress Notes (Signed)
Pt had increasing drainage from midline dressing and honeycomb dressing had fallen off. Md notified and replaced with dry gauze and abd pad. Pt heart rate has been slowly increasing and had been in the 150's while ambulating with PT. Md alerted and said to ensure she is drinking and he will assess her shortly. Will continue to monitor.

## 2015-03-05 NOTE — Evaluation (Signed)
Physical Therapy Evaluation Patient Details Name: Natalie [name] MRN: [id number] DOB: [birth year] Today's Date: 03/05/2015   History of Present Illness  Natalie [name] is a pleasant 59 yo F with a history of multiple prior episodes of diverticulitis who presented to [facility] ED with 1 day of worsening abdominal pain.  Began acutely in the morning and worsened in the evening around 9.  + chills.  + nausea/vomiting with CT contrast.  No recent BM.  Has had a history of chronic constipation. Has had approximately 8 episodes of diverticulitis prior, approximately 2 per year x 4 years.  Pt underwent sigmoidectomy and colostomy due to perforated diverticulitis and is POD#4 at time of PT evaluation. Pt reports fully independent community ambulation prior to admission. Fully independent with ADLs/IADLs  Clinical Impression  Pt demonstrates decreased cardiopulmonary endurance limiting activity. She complains of SOB with short distance ambulation. Pt is tachycardic at rest (131 bpm) and increases to 145 bpm with ambulation. Further ambulation deferred due to complaints of SOB and elevated HR. Pt denies chest pain or palpitations. Overall her strength is functional but she is deconditioned from extended bed rest. Pt will benefit from skilled PT services to address deficits in strength, balance, and mobility in order to return to full function at home.     Follow Up Recommendations Home health PT    Equipment Recommendations  Rolling walker with 5" wheels (Unclear if walker at home is sufficient based on pt report)    Recommendations for Other Services       Precautions / Restrictions Precautions Precautions: Fall Restrictions Weight Bearing Restrictions: No      Mobility  Bed Mobility Overal bed mobility: Modified Independent             General bed mobility comments: Use of bed rails and HOB elevated. Appropriate speed although slightly guarded due to incisional pain  Transfers Overall  transfer level: Needs assistance Equipment used: Rolling walker (2 wheeled) Transfers: Sit to/from Stand Sit to Stand: Supervision         General transfer comment: Good speed, sequencing, and stability noted with sit to stand. No overt LOB   Ambulation/Gait Ambulation/Gait assistance: Supervision Ambulation Distance (Feet): 50 Feet Assistive device: Rolling walker (2 wheeled) Gait Pattern/deviations: Decreased step length - right;Decreased step length - left Gait velocity: Decreased Gait velocity interpretation: <1.8 ft/sec, indicative of risk for recurrent falls General Gait Details: Pt demonstrates decreased overall gait speed. Good stability with use of rolling walker. Able to perform static balance without UE support. Vitals monitored and SaO2 remains 89-91% on 2L/min O2. Pt reports significant SOB and fatigue with ambulation. HR increases from 131 bpm at rest to 145 with ambulation. RN notified of tachycardia at rest and with ambulation. Pt asymptomatic  Stairs            Wheelchair Mobility    Modified Rankin (Stroke Patients Only)       Balance Overall balance assessment: Needs assistance Sitting-balance support: No upper extremity supported Sitting balance-Leahy Scale: Good     Standing balance support: No upper extremity supported Standing balance-Leahy Scale: Fair                               Pertinent Vitals/Pain Pain Assessment: 0-10 Pain Score: 0-No pain Pain Location: Pt reports no pain at rest, tenderness over abdominal incision with touch Pain Intervention(s): Premedicated before session;Monitored during session    Natalie [name] expects to  be discharged to:: Private residence Living Arrangements: Spouse/significant other;Other (Comment) (granddaughter) Available Help at Discharge: Family (Husband considering FMLA to assist patient) Type of Home: House Home Access: Ramped entrance     Home Layout: One level Home  Equipment: Environmental consultant - 2 wheels;Shower seat - built in;Grab bars - tub/shower      Prior Function Level of Independence: Independent         Comments: Independent with ADL/sIADLs     Hand Dominance   Dominant Hand: Right    Extremity/Trunk Assessment   Upper Extremity Assessment: Overall WFL for tasks assessed           Lower Extremity Assessment: Overall WFL for tasks assessed         Communication   Communication: No difficulties  Cognition Arousal/Alertness: Awake/alert Behavior During Therapy: WFL for tasks assessed/performed Overall Cognitive Status: Within Functional Limits for tasks assessed                      General Comments      Exercises        Assessment/Plan    PT Assessment Patient needs continued PT services  PT Diagnosis Difficulty walking;Generalized weakness   PT Problem List Decreased strength;Decreased activity tolerance;Cardiopulmonary status limiting activity;Pain  PT Treatment Interventions DME instruction;Gait training;Stair training;Therapeutic activities;Therapeutic exercise;Neuromuscular re-education;Balance training;Patient/family education   PT Goals (Current goals can be found in the Care Plan section) Acute Rehab PT Goals Patient Stated Goal: To get stronger and return to prior level of function PT Goal Formulation: With patient Time For Goal Achievement: 03/19/15 Potential to Achieve Goals: Good    Frequency Min 2X/week   Barriers to discharge        Co-evaluation               End of Session Equipment Utilized During Treatment: Gait belt Activity Tolerance: Treatment limited secondary to medical complications (Comment) (Elevated HR, SOB) Patient left: in chair;with call bell/phone within reach (Agrees to call RN prior to transfer. RN notified) Nurse Communication: Mobility status (Tachycardia)         Time: 1535-1600 PT Time Calculation (min) (ACUTE ONLY): 25 min   Charges:   PT  Evaluation $Initial PT Evaluation Tier I: 1 Procedure PT Treatments $Gait Training: 8-22 mins   PT G Codes:       Lyndel Safe Huprich PT, DPT   Huprich,Jason 03/05/2015, 4:29 PM

## 2015-03-05 NOTE — Progress Notes (Signed)
4 Days Post-Op   Subjective:  59 year old female postop day #4 status post sigmoid colectomy with Hartman's colostomy. Patient states she feels swollen today more so than yesterday. She has been tolerating clear liquid diet and having gasper ostomy but has not had any stool in the bag yet. She also states she likes she feels like she is short of breath and having difficulty catching her breath. She denies any fevers or chills and has not had any chest pain. She has been out of bed and is looking forward to physical therapy.  Vital signs in last 24 hours: Temp:  [98.1 F (36.7 C)-98.3 F (36.8 C)] 98.1 F (36.7 C) (11/27 2128) Pulse Rate:  [109] 109 (11/27 2128) Resp:  [18] 18 (11/27 2128) BP: (109-122)/(46-53) 122/53 mmHg (11/27 2128) SpO2:  [94 %-98 %] 94 % (11/27 2128) Last BM Date:  (pt unsure, "last week")  Intake/Output from previous day: 11/27 0701 - 11/28 0700 In: 838 [P.O.:600; I.V.:238] Out: 1510 [Urine:1350; Drains:160]   Physical exam: Gen.: No acute distress  chest: Clear to auscultation all quadrants but with shallow breaths. Heart: GI: Soft, appropriately tender to palpation along her midline incision and surgical sites, JP drains in place with serosanguineous output, ostomy in place with gas and fluid but no stool.  Lab Results:  CBC  Recent Labs  03/03/15 0600 03/04/15 0830  WBC 17.5* 17.4*  HGB 11.6* 11.7*  HCT 34.2* 34.9*  PLT 222 238   CMP     Component Value Date/Time   NA 138 03/04/2015 0830   K 3.5 03/04/2015 0830   CL 110 03/04/2015 0830   CO2 22 03/04/2015 0830   GLUCOSE 136* 03/04/2015 0830   BUN 16 03/04/2015 0830   CREATININE 0.70 03/04/2015 0830   CALCIUM 7.6* 03/04/2015 0830   PROT 7.2 02/27/2015 2323   ALBUMIN 4.2 02/27/2015 2323   AST 22 02/27/2015 2323   ALT 14 02/27/2015 2323   ALKPHOS 68 02/27/2015 2323   BILITOT 1.0 02/27/2015 2323   GFRNONAA >60 03/04/2015 0830   GFRAA >60 03/04/2015 0830   PT/INR No results for input(s):  LABPROT, INR in the last 72 hours.  Studies/Results: No results found.  Assessment/Plan: 59 year old female status post sigmoid colectomy for perforated diverticulitis. We'll provide with 1 time dose of Lasix today to assist with fluid mobilization Chest x-ray to further evaluate shortness of breath Physical therapy consultation today Possible transition to oral antibiotics today versus tomorrow Plan to remove JP drain with least output Labs in the morning Lovenox for DVT prophylaxis  Clayburn Pert, MD [facility] General Surgeon [facility] Surgical

## 2015-03-06 ENCOUNTER — Inpatient Hospital Stay: Payer: Managed Care, Other (non HMO)

## 2015-03-06 ENCOUNTER — Encounter: Payer: Self-pay | Admitting: Radiology

## 2015-03-06 DIAGNOSIS — J9621 Acute and chronic respiratory failure with hypoxia: Secondary | ICD-10-CM

## 2015-03-06 DIAGNOSIS — J90 Pleural effusion, not elsewhere classified: Secondary | ICD-10-CM

## 2015-03-06 LAB — URINALYSIS COMPLETE WITH MICROSCOPIC (ARMC ONLY)
BACTERIA UA: NONE SEEN
Bilirubin Urine: NEGATIVE
GLUCOSE, UA: NEGATIVE mg/dL
LEUKOCYTES UA: NEGATIVE
Nitrite: NEGATIVE
PH: 5 (ref 5.0–8.0)
PROTEIN: 30 mg/dL — AB
SPECIFIC GRAVITY, URINE: 1.041 — AB (ref 1.005–1.030)

## 2015-03-06 LAB — CBC
HCT: 29.9 % — ABNORMAL LOW (ref 35.0–47.0)
HEMATOCRIT: 31.7 % — AB (ref 35.0–47.0)
HEMOGLOBIN: 10.1 g/dL — AB (ref 12.0–16.0)
HEMOGLOBIN: 10.7 g/dL — AB (ref 12.0–16.0)
MCH: 31.7 pg (ref 26.0–34.0)
MCH: 31.3 pg (ref 26.0–34.0)
MCHC: 33.8 g/dL (ref 32.0–36.0)
MCHC: 33.6 g/dL (ref 32.0–36.0)
MCV: 93.7 fL (ref 80.0–100.0)
MCV: 93.1 fL (ref 80.0–100.0)
PLATELETS: 249 10*3/uL (ref 150–440)
Platelets: 291 10*3/uL (ref 150–440)
RBC: 3.19 MIL/uL — AB (ref 3.80–5.20)
RBC: 3.41 MIL/uL — AB (ref 3.80–5.20)
RDW: 13.7 % (ref 11.5–14.5)
RDW: 13.7 % (ref 11.5–14.5)
WBC: 29.1 10*3/uL — ABNORMAL HIGH (ref 3.6–11.0)
WBC: 31.4 10*3/uL — AB (ref 3.6–11.0)

## 2015-03-06 LAB — BASIC METABOLIC PANEL
Anion gap: 9 (ref 5–15)
BUN: 14 mg/dL (ref 6–20)
CO2: 24 mmol/L (ref 22–32)
CREATININE: 0.89 mg/dL (ref 0.44–1.00)
Calcium: 7.6 mg/dL — ABNORMAL LOW (ref 8.9–10.3)
Chloride: 105 mmol/L (ref 101–111)
Glucose, Bld: 107 mg/dL — ABNORMAL HIGH (ref 65–99)
POTASSIUM: 2.9 mmol/L — AB (ref 3.5–5.1)
SODIUM: 138 mmol/L (ref 135–145)

## 2015-03-06 LAB — MAGNESIUM: Magnesium: 1.7 mg/dL (ref 1.7–2.4)

## 2015-03-06 LAB — PHOSPHORUS: Phosphorus: 2.1 mg/dL — ABNORMAL LOW (ref 2.5–4.6)

## 2015-03-06 MED ORDER — VANCOMYCIN HCL IN DEXTROSE 750-5 MG/150ML-% IV SOLN
750.0000 mg | Freq: Two times a day (BID) | INTRAVENOUS | Status: DC
  Administered 2015-03-07 – 2015-03-13 (×13): 750 mg via INTRAVENOUS
  Filled 2015-03-06 (×14): qty 150

## 2015-03-06 MED ORDER — POTASSIUM CHLORIDE 10 MEQ/100ML IV SOLN
10.0000 meq | INTRAVENOUS | Status: AC
  Administered 2015-03-06: 10 meq via INTRAVENOUS
  Filled 2015-03-06 (×4): qty 100

## 2015-03-06 MED ORDER — IOHEXOL 240 MG/ML SOLN
25.0000 mL | INTRAMUSCULAR | Status: AC
  Administered 2015-03-06 (×2): 25 mL via ORAL

## 2015-03-06 MED ORDER — POTASSIUM CHLORIDE 10 MEQ/100ML IV SOLN
10.0000 meq | INTRAVENOUS | Status: AC
  Administered 2015-03-06 (×2): 10 meq via INTRAVENOUS
  Filled 2015-03-06 (×2): qty 100

## 2015-03-06 MED ORDER — KCL IN DEXTROSE-NACL 20-5-0.45 MEQ/L-%-% IV SOLN
INTRAVENOUS | Status: DC
  Administered 2015-03-06 – 2015-03-16 (×16): via INTRAVENOUS
  Filled 2015-03-06 (×19): qty 1000

## 2015-03-06 MED ORDER — VANCOMYCIN HCL 10 G IV SOLR
1500.0000 mg | Freq: Once | INTRAVENOUS | Status: AC
  Administered 2015-03-06: 1500 mg via INTRAVENOUS
  Filled 2015-03-06: qty 1500

## 2015-03-06 MED ORDER — IOHEXOL 350 MG/ML SOLN
100.0000 mL | Freq: Once | INTRAVENOUS | Status: AC | PRN
  Administered 2015-03-06: 100 mL via INTRAVENOUS

## 2015-03-06 MED ORDER — POTASSIUM CHLORIDE 10 MEQ/100ML IV SOLN
10.0000 meq | INTRAVENOUS | Status: AC
  Filled 2015-03-06 (×3): qty 100

## 2015-03-06 MED ORDER — VANCOMYCIN HCL IN DEXTROSE 1-5 GM/200ML-% IV SOLN: 1000.0000 mg | Freq: Two times a day (BID) | INTRAVENOUS | Status: DC

## 2015-03-06 NOTE — Progress Notes (Signed)
3pm-7pm  A/O.  VSS.  Patient had quite a bit of pain after the MD came in and changed the dressing and looked at her incision, patient given Morphine and Percocet.  Tolerated clear liquid diet.  I was able to run her IV potassium just at a slower rate.  Plan for a thoracentesis in the morning.

## 2015-03-06 NOTE — Progress Notes (Signed)
Visit with patient to review results from today's images. Due to elevated blood cell count thought was initially there might be an undrained abscess. This was not seen on her CT scan was obtained today.  Plan to check urine for possible infection and a vas pulmonology/radiology for drainage of pleural effusions.  Given that her white blood cell count is increased while on Zosyn all broaden antibiotics and add vancomycin. Pharmacy consult for back dosing.  Continue to monitor closely.  Clayburn Pert, MD FACS General Surgeon [facility] Surgical

## 2015-03-06 NOTE — Consult Note (Signed)
WOC ostomy follow up Stoma type/location: LLQ Colostomy from perforated diverticulum.  No stool in pouch yet.  Pouch change today for teaching.  Will switch to a 1 piece flat pouch.  Patient thinks this may work best for her lifestyle after discussion of benefits of 2 piece vs 1 piece.  Barrier ring will still be used. Abdomen is edematous with creasing at 3 and 9 o'clock. Pouch is cut off center to accommodate midline surgical incision.  Patient states she feels tired today.  Noted fever and increased WBC.  Pustule present to stoma at 4 o'clock.   Stomal assessment/size: 1 1/2" oval stoma pink and moist.  No stool yet.  Peristomal assessment: Edematous, creasing at 3 and 9 o'clock.  Treatment options for stomal/peristomal skin: Barrier ring, 1 piece flat pouch.  Output Some liquid noted in pouch Ostomy pouching: 1pc.flat with barrier ring Education provided: Pouch change today.  Patient able to cut the pattern for the barrier, apply barrier ring and roll pouch closed.  Understands to empty when 1/3 full (when stools begin).  Ongoing education needed.  Will be getting a CT scan today to assess other clinical findings.  Enrolled patient in [place] Start Discharge program: Yes [place] team will continue to follow and remain available to patient, medical and nursing teams.  Domenic Moras RN BSN [place] Pager [phone]

## 2015-03-06 NOTE — Progress Notes (Signed)
5 Days Post-Op   Subjective:  Patient reports not feeling as well as the day before. Her breathing has improved and her swelling is improved however she has a feeling of malaise and fever. She also states that She Had Is Now Gone. Still Passing Flatus per Her Ostomy Bag No Stool yet  Vital signs in last 24 hours: Temp:  [97.5 F (36.4 C)-97.9 F (36.6 C)] 97.5 F (36.4 C) (11/29 0511) Pulse Rate:  [105-144] 144 (11/29 0511) Resp:  [18] 18 (11/29 0511) BP: (104-133)/(49-70) 133/70 mmHg (11/29 0511) SpO2:  [93 %-97 %] 93 % (11/29 0511) Last BM Date:  (pt unsure, "last week")  Intake/Output from previous day: 11/28 0701 - 11/29 0700 In: 1135 [P.O.:940; IV Piggyback:100] Out: [id number] [Urine:1350; Drains:123]  Physical exam: General: Resting in bed in no acute distress Chest: Clear to auscultation Heart: Tachycardic GI: Soft, tender to palpation in all quadrants, JPs in place to the right lower quadrant with serosanguineous output, midline staples in place with moderate amount of serous drainage but without spreading erythema or pus, ostomy in place to the left lower quadrant with edematous appearing bowel that is digitally open but without palpable stool.  Lab Results:  CBC  Recent Labs  03/06/15 0353 03/06/15 0846  WBC 29.1* 31.4*  HGB 10.1* 10.7*  HCT 29.9* 31.7*  PLT 249 291   CMP     Component Value Date/Time   NA 138 03/06/2015 0353   K 2.9* 03/06/2015 0353   CL 105 03/06/2015 0353   CO2 24 03/06/2015 0353   GLUCOSE 107* 03/06/2015 0353   BUN 14 03/06/2015 0353   CREATININE 0.89 03/06/2015 0353   CALCIUM 7.6* 03/06/2015 0353   PROT 7.2 02/27/2015 2323   ALBUMIN 4.2 02/27/2015 2323   AST 22 02/27/2015 2323   ALT 14 02/27/2015 2323   ALKPHOS 68 02/27/2015 2323   BILITOT 1.0 02/27/2015 2323   GFRNONAA >60 03/06/2015 0353   GFRAA >60 03/06/2015 0353   PT/INR No results for input(s): LABPROT, INR in the last 72 hours.  Studies/Results: Dg Chest 2  View  03/05/2015  CLINICAL DATA:  Bowel resection last Thursday. Hx of bowel perf. Increase in [facility] since yesterday. Pt denies any PMH of lung or heart problems. Non-smoker EXAM: CHEST  2 VIEW COMPARISON:  None. FINDINGS: Moderate to large right and moderate left pleural effusions. There is associated dependent lung opacity most likely atelectasis. No pulmonary edema. No pneumothorax. Cardiac silhouette is normal in size and configuration. No mediastinal or hilar masses or convincing adenopathy. Bony thorax is intact. IMPRESSION: 1. Moderate to large right and moderate left pleural effusions with associated lung base opacity, most likely atelectasis. No pulmonary edema. No pneumothorax. Electronically Signed   By: Lajean Manes M.D.   On: 03/05/2015 09:09    Assessment/Plan: 59 year old female postop day #5 status post export her laparotomy with Hartman's procedure for ruptured sigmoid diverticulitis. She has clinically worsened over the last 24 hours with a dramatic rise in her white blood cell count. Labs repeated to confirm accuracy and found to be higher than previous. Patient does have a moderate sized pleural effusion but also with continued lack of output from her ostomy. Plan for today is to return to nothing by mouth, restart IV fluids, obtain CT of the abdomen and pelvis with by mouth and IV contrast. Fear is that there is an undrained abscess within the abdomen causing her clinical deterioration. If there is no evidence of abscess within the abdomen were  then asked for a medicine consultation for possible pleurocentesis for her pleural effusion.  Clayburn Pert, MD FACS General Surgeon [facility] Surgical

## 2015-03-06 NOTE — Consult Note (Signed)
Natalie [name]      Assessment and Plan:  Pleural effusions.  --Likely multifactorial from volume resuscitation, and response to abdominal compartent.  --May benefit from diuresis. --I have ordered a right thoracentesis by IR. Marland Kitchen Pt has been on zosyn, therefore empyema would be less likely .   Atelectasis.  --Post op as well as compression atelectasis from effusion.  --Advance activity as tolerated.   Acute respiratory failure secondary to above.   S/p Ex-lap with colostomy due to perforated diverticulitis.      Date: 03/06/2015  MRN# [id number] Natalie [name] 19-Nov-1955  Referring Physician:   Janine Limbo [name] for chief complaint of:    Chief Complaint  Patient presents with  . Abdominal Pain    HPI:  The patient was admitted on 11/23 with abdominal pain, subsequently she underwent ex lap with sigmoidectomy, colostomy for perf tic on 11/24. She was maintained on PCA for a day, her diet was advanced. Yesterday it was noted that she was having some difficulty catching her breath, she also had increased drainage.  She has been on protonix and zosyn since 11/26. She was 2L of oxygen since on 11/26, and oxygen has been weaned off today.   I have reviewed CT chest from 03/06/15. There is bilateral pleural effusion and atelectasis, small on left and moderate on right. Patient has been afebril, but today had a mild temp of 100.2.    PMHX:   Past Medical History  Diagnosis Date  . Blood in stool 2/20016  . Diverticulitis   . UTI (lower urinary tract infection)   . Chronic sinusitis    Surgical Hx:  Past Surgical History  Procedure Laterality Date  . Laparotomy N/A 03/01/2015    Procedure: EXPLORATORY LAPAROTOMY, SIGMOID COLECTOMY, COLOSTOMY;  Surgeon: Hubbard Robinson, MD;  Location: [facility] ORS;  Service: General;  Laterality: N/A;   Family Hx:  Family History  Problem Relation Age of Onset    . Stroke Mother   . Arthritis Father   . Lung cancer Father   . Colon cancer Maternal Aunt   . Breast cancer Paternal Aunt    Social Hx:   Social History  Substance Use Topics  . Smoking status: Never Smoker   . Smokeless tobacco: None  . Alcohol Use: No   Medication:   No current outpatient prescriptions on file.    Allergies:  Codeine and Sulfa antibiotics  Review of Systems: Gen:  Denies  fever, sweats, chills HEENT: Denies blurred vision, double vision. bleeds, sore throat Cvc:  No dizziness, chest pain. Resp:   Denies cough or sputum porduction, shortness of breath Gi: Denies swallowing difficulty, stomach pain. Gu:  Denies bladder incontinence, burning urine Ext:   No Joint pain, stiffness. Skin: No skin rash,  hives Endoc:  No polyuria, polydipsia. Psych: No depression, insomnia. Other:  All other systems were reviewed with the patient and were negative other that what is mentioned in the HPI.   Physical Examination:   VS: BP 133/60 mmHg  Pulse 111  Temp(Src) 100.2 F (37.9 C) (Oral)  Resp 20  Ht 5' 4.5" (1.638 m)  Wt 68.04 kg (150 lb)  BMI 25.36 kg/m2  SpO2 92%  General Appearance: No distress  Neuro:without focal findings,  speech normal,  HEENT: PERRLA, EOM intact.   Pulmonary: normal breath sounds, No wheezing.  CardiovascularNormal S1,S2.  No m/r/g.   Abdomen: Benign, Soft, non-tender. Renal:  No  costovertebral tenderness  GU:  No performed at this time. Endoc: No evident thyromegaly, no signs of acromegaly. Skin:   warm, no rashes, no ecchymosis  Extremities: normal, no cyanosis, clubbing.  Other findings:    LABORATORY PANEL:   CBC  Recent Labs Lab 03/06/15 0846  WBC 31.4*  HGB 10.7*  HCT 31.7*  PLT 291   ------------------------------------------------------------------------------------------------------------------  Chemistries   Recent Labs Lab 02/27/15 2323  03/06/15 0353  NA 141  < > 138  K 3.1*  < > 2.9*  CL 106   < > 105  CO2 26  < > 24  GLUCOSE 170*  < > 107*  BUN 11  < > 14  CREATININE 0.73  < > 0.89  CALCIUM 9.0  < > 7.6*  MG  --   --  1.7  AST 22  --   --   ALT 14  --   --   ALKPHOS 68  --   --   BILITOT 1.0  --   --   < > = values in this interval not displayed. ------------------------------------------------------------------------------------------------------------------  Cardiac Enzymes No results for input(s): TROPONINI in the last 168 hours. ------------------------------------------------------------  RADIOLOGY:  Dg Chest 2 View  03/05/2015  CLINICAL DATA:  Bowel resection last Thursday. Hx of bowel perf. Increase in [facility] since yesterday. Pt denies any PMH of lung or heart problems. Non-smoker EXAM: CHEST  2 VIEW COMPARISON:  None. FINDINGS: Moderate to large right and moderate left pleural effusions. There is associated dependent lung opacity most likely atelectasis. No pulmonary edema. No pneumothorax. Cardiac silhouette is normal in size and configuration. No mediastinal or hilar masses or convincing adenopathy. Bony thorax is intact. IMPRESSION: 1. Moderate to large right and moderate left pleural effusions with associated lung base opacity, most likely atelectasis. No pulmonary edema. No pneumothorax. Electronically Signed   By: Lajean Manes M.D.   On: 03/05/2015 09:09   Ct Abdomen Pelvis W Contrast  03/06/2015  CLINICAL DATA:  Elevated white blood count. The patient had colonic resection and colostomy last Thursday with 2 drains placed. Lower abdominal pain with fever and nausea. EXAM: CT ABDOMEN AND PELVIS WITH CONTRAST TECHNIQUE: Multidetector CT imaging of the abdomen and pelvis was performed using the standard protocol following bolus administration of intravenous contrast. CONTRAST:  170mL OMNIPAQUE IOHEXOL 350 MG/ML SOLN COMPARISON:  CT abdomen pelvis 02/28/2015. Abdominal radiographs 04/30/2014. FINDINGS: Lung bases: There is at least moderate size right pleural effusion,  incompletely imaged. There is significant atelectasis in the right lower lobe and to a lesser degree the right middle lobe. These pleural effusions are new compared to the recent CT abdomen pelvis. There is a small left pleural effusion or imaged, with significant left lower lobe atelectasis. Patchy airspace disease in the lingula appears new compared to recent prior. Small pericardial effusion, new. If small amount of perihepatic/subdiaphragmatic ascites. There is apparent slight enhancement of the peritoneum in the right upper quadrant, in the region of ascites. Additionally, there is a pocket of ascites adjacent to the lower pole of the right kidney. Previously seen fluid adjacent to the ascending colon on the CT of 02/28/2015 has significantly decreased. No free intraperitoneal air is identified. The liver, gallbladder, spleen, adrenal glands, kidneys, and pancreas are within normal limits. Interval sigmoidectomy and creation of a left lower quadrant colostomy. Anastomotic suture is seen in the midline pelvis on image number 77. There is no evidence of bowel obstruction. No significant fluid collection is identified in the pelvis.  There is no evidence of pelvic abscess. Urinary bladder appears normal. The uterus and adnexa are within normal limits. Bowel loops are normal in caliber. The stomach is filled with oral contrast and is nondistended. Abdominal aorta is normal in caliber. Free of atherosclerotic calcification. No acute or suspicious bony abnormality. Mild anasarca of the body wall. IMPRESSION: 1. No evidence of abdominal or pelvic abscess. Postoperative changes of partial sigmoid resection and left lower quadrant quadrant colostomy creation, and placement of 2 pelvic drains. No postoperative complicating features identified in the pelvis. 2. Small amount of right upper quadrant ascites. There is slight peritoneal enhancement in the right upper quadrant, adjacent to the liver and outlined by the ascites,  of uncertain significance. A degree of peritonitis cannot be excluded given this finding. 3. Moderate right and small left pleural effusions, partially imaged, with significant bibasilar atelectasis. 4. Small pericardial effusion, new. 5. Mild body wall anasarca. Electronically Signed   By: Curlene Dolphin M.D.   On: 03/06/2015 14:27       Thank  you for the [name] and for allowing [facility] to assist in the care of your patient. Our recommendations are noted above.  Please contact us if we can be of further service.   Marda Stalker, MD.  Board Certified in Internal Medicine, Pulmonary Medicine, [place], and Sleep Medicine.  [facility] Office Number: [phone]  Patricia Pesa, M.D.  Vilinda Boehringer, M.D.  Merton Border, M.D

## 2015-03-06 NOTE — Consult Note (Signed)
ANTIBIOTIC CONSULT NOTE - INITIAL  Pharmacy Consult for vancomycin Indication: wound infection  Allergies  Allergen Reactions  . Codeine Nausea Only  . Sulfa Antibiotics Rash    Patient Measurements: Height: 5' 4.5" (163.8 cm) Weight: 150 lb (68.04 kg) IBW/kg (Calculated) : 55.85 Adjusted Body Weight: 60kg  Vital Signs: Temp: 100.2 F (37.9 C) (11/29 1301) Temp Source: Oral (11/29 1301) BP: 133/60 mmHg (11/29 1301) Pulse Rate: 111 (11/29 1301) Intake/Output from previous day: 11/28 0701 - 11/29 0700 In: 1135 [P.O.:940; IV Piggyback:100] Out: [id number] [Urine:1350; Drains:123] Intake/Output from this shift: Total I/O In: 1070 [P.O.:1000; Other:70] Out: [place]:  Recent Labs  03/04/15 0830 03/06/15 0353 03/06/15 0846  WBC 17.4* 29.1* 31.4*  HGB 11.7* 10.1* 10.7*  PLT 238 249 291  CREATININE 0.70 0.89  --    Estimated Creatinine Clearance: 64.4 mL/min (by C-G formula based on Cr of 0.89). No results for input(s): VANCOTROUGH, VANCOPEAK, VANCORANDOM, GENTTROUGH, GENTPEAK, GENTRANDOM, TOBRATROUGH, TOBRAPEAK, TOBRARND, AMIKACINPEAK, AMIKACINTROU, AMIKACIN in the last 72 hours.   Microbiology: No results found for this or any previous visit (from the past 720 hour(s)).  Medical History: Past Medical History  Diagnosis Date  . Blood in stool 2/20016  . Diverticulitis   . UTI (lower urinary tract infection)   . Chronic sinusitis     Medications:  Scheduled:  . antiseptic oral rinse  7 mL Mouth Rinse BID  . cyclobenzaprine  10 mg Oral TID  . fluticasone  2 spray Each Nare Daily  . heparin  5,000 Units Subcutaneous 3 times per day  . pantoprazole (PROTONIX) IV  40 mg Intravenous QHS  . piperacillin-tazobactam (ZOSYN)  IV  3.375 g Intravenous 3 times per day  . potassium chloride  10 mEq Intravenous Q1 Hr x 2  . vancomycin  1,000 mg Intravenous Q12H   Assessment: Pt is a 59 year old female postop day #5 status post export her laparotomy with  Hartman's procedure for ruptured sigmoid diverticulitis. Pt is currently on zosyn with increased WBC. Pharmacy consulted to dose vancomycin to broaden coverage  Ke=0.058 T1/2=11 vd=42  Goal of Therapy:  Vancomycin trough level 15-20 mcg/ml  Plan:  Measure antibiotic drug levels at steady state Follow up culture results Will give 1500mg  for the first dose then transition to 750mg  q 12 hours  Measure trough at steady state 1202 @ 0715.  Ariell Gunnels D Pressley Tadesse 03/06/2015,6:34 PM

## 2015-03-07 ENCOUNTER — Inpatient Hospital Stay: Payer: Managed Care, Other (non HMO)

## 2015-03-07 ENCOUNTER — Other Ambulatory Visit: Payer: Managed Care, Other (non HMO)

## 2015-03-07 DIAGNOSIS — J9811 Atelectasis: Secondary | ICD-10-CM

## 2015-03-07 LAB — LACTATE DEHYDROGENASE, PLEURAL OR PERITONEAL FLUID: LD FL: 362 U/L — AB (ref 3–23)

## 2015-03-07 LAB — BASIC METABOLIC PANEL
ANION GAP: 7 (ref 5–15)
BUN: 10 mg/dL (ref 6–20)
CALCIUM: 7.3 mg/dL — AB (ref 8.9–10.3)
CO2: 26 mmol/L (ref 22–32)
Chloride: 104 mmol/L (ref 101–111)
Creatinine, Ser: 0.74 mg/dL (ref 0.44–1.00)
GFR calc non Af Amer: >60 mL/min (ref >60)
Glucose, Bld: 131 mg/dL — ABNORMAL HIGH (ref 65–99)
POTASSIUM: 3 mmol/L — AB (ref 3.5–5.1)
Sodium: 137 mmol/L (ref 135–145)

## 2015-03-07 LAB — PROTEIN, BODY FLUID: Total protein, fluid: <3 g/dL

## 2015-03-07 LAB — CBC
HEMATOCRIT: 28.6 % — AB (ref 35.0–47.0)
HEMOGLOBIN: 9.8 g/dL — AB (ref 12.0–16.0)
MCH: 31.6 pg (ref 26.0–34.0)
MCHC: 34.1 g/dL (ref 32.0–36.0)
MCV: 92.6 fL (ref 80.0–100.0)
Platelets: 307 10*3/uL (ref 150–440)
RBC: 3.09 MIL/uL — AB (ref 3.80–5.20)
RDW: 14 % (ref 11.5–14.5)
WBC: 32.9 10*3/uL — AB (ref 3.6–11.0)

## 2015-03-07 LAB — PATHOLOGIST SMEAR REVIEW

## 2015-03-07 LAB — BODY FLUID CELL COUNT WITH DIFFERENTIAL
Eos, Fluid: 0 %
Lymphs, Fluid: 8 %
MONOCYTE-MACROPHAGE-SEROUS FLUID: 4 %
NEUTROPHIL FLUID: 89 %
Other Cells, Fluid: 0 %
WBC FLUID: 6203 uL

## 2015-03-07 LAB — PHOSPHORUS: PHOSPHORUS: 2.9 mg/dL (ref 2.5–4.6)

## 2015-03-07 LAB — GLUCOSE, SEROUS FLUID: Glucose, Fluid: 115 mg/dL

## 2015-03-07 LAB — AMYLASE: AMYLASE: 54 U/L (ref 28–100)

## 2015-03-07 LAB — MAGNESIUM: Magnesium: 1.7 mg/dL (ref 1.7–2.4)

## 2015-03-07 MED ORDER — NYSTATIN 100000 UNIT/ML MT SUSP
5.0000 mL | Freq: Four times a day (QID) | OROMUCOSAL | Status: DC
  Administered 2015-03-07 – 2015-03-16 (×29): 500000 [IU] via ORAL
  Filled 2015-03-07 (×32): qty 5

## 2015-03-07 NOTE — Care Management (Signed)
Spoke with patient who is alert and oriented from home with husband. Patient stated that she has a walker but it is of the older variety not a front wheeled walker. She feel that a new walker would assist her in ambulating in her home while recuperating from her surgery. No home health preference, depends on her insurance co pays. Patient has [employer]. Will contact agencies for lowest co pay. Will need RN to assist with wound care and PT for weakness. Husband drives but is out of the home for 12 hours daily . Has a grand daughter that is 67 that lives in the home but is in school during the day. Patient stated that she feels she will be safe to go home.  More to follow.

## 2015-03-07 NOTE — Care Management (Addendum)
Home health will need to be arranged through [facility]

## 2015-03-07 NOTE — Procedures (Signed)
US guided right thoracentesis.  Removed 850 ml of amber colored fluid without immediate complication.   CXR to follow.

## 2015-03-07 NOTE — Progress Notes (Signed)
6 Days Post-Op   Subjective:  59 year old female postop day #6 status post export her laparotomy with sigmoid resection and end colostomy. States that she is breathing better since her thoracentesis this morning. Continues to have abdominal discomfort and lack of appetite. Denies any nausea or vomiting. Large amounts of gas visualized within the ostomy bag.  Vital signs in last 24 hours: Temp:  [98 F (36.7 C)-100.2 F (37.9 C)] 99.6 F (37.6 C) (11/30 0425) Pulse Rate:  [102-128] 118 (11/30 0935) Resp:  [17-30] 30 (11/30 0935) BP: (123-160)/(50-67) 131/54 mmHg (11/30 0935) SpO2:  [92 %-95 %] 95 % (11/30 0935) Last BM Date:  (pt unsure, "last week")  Intake/Output from previous day: 11/29 0701 - 11/30 0700 In: 3100 [P.O.:1000; I.V.:1462; IV Piggyback:584] Out: [id number] [Urine:1400; Drains:138]  Physical exam: Gen.: Resting in bed in no acute distress Chest: Clear to auscultation Heart: Tachycardic GI: Abdomen remains soft, tender to palpation at her midline incision, drain sites, ostomy sites. JPs in the right lower quadrant with serous and serosanguineous output. Ostomy present in the left upper quadrant continues to be edematous but with large amounts of gas within the ostomy bag. Midline incision with gauze as wicks within staples. There is spreading erythema from this that is blanchable but there is no purulent drainage seen from the midline.  Lab Results:  CBC  Recent Labs  03/06/15 0846 03/07/15 0446  WBC 31.4* 32.9*  HGB 10.7* 9.8*  HCT 31.7* 28.6*  PLT 291 307   CMP     Component Value Date/Time   NA 137 03/07/2015 0446   K 3.0* 03/07/2015 0446   CL 104 03/07/2015 0446   CO2 26 03/07/2015 0446   GLUCOSE 131* 03/07/2015 0446   BUN 10 03/07/2015 0446   CREATININE 0.74 03/07/2015 0446   CALCIUM 7.3* 03/07/2015 0446   PROT 7.2 02/27/2015 2323   ALBUMIN 4.2 02/27/2015 2323   AST 22 02/27/2015 2323   ALT 14 02/27/2015 2323   ALKPHOS 68 02/27/2015 2323   BILITOT  1.0 02/27/2015 2323   GFRNONAA >60 03/07/2015 0446   GFRAA >60 03/07/2015 0446   PT/INR No results for input(s): LABPROT, INR in the last 72 hours.  Studies/Results: Ct Abdomen Pelvis W Contrast  03/06/2015  CLINICAL DATA:  Elevated white blood count. The patient had colonic resection and colostomy last Thursday with 2 drains placed. Lower abdominal pain with fever and nausea. EXAM: CT ABDOMEN AND PELVIS WITH CONTRAST TECHNIQUE: Multidetector CT imaging of the abdomen and pelvis was performed using the standard protocol following bolus administration of intravenous contrast. CONTRAST:  152mL OMNIPAQUE IOHEXOL 350 MG/ML SOLN COMPARISON:  CT abdomen pelvis 02/28/2015. Abdominal radiographs 04/30/2014. FINDINGS: Lung bases: There is at least moderate size right pleural effusion, incompletely imaged. There is significant atelectasis in the right lower lobe and to a lesser degree the right middle lobe. These pleural effusions are new compared to the recent CT abdomen pelvis. There is a small left pleural effusion or imaged, with significant left lower lobe atelectasis. Patchy airspace disease in the lingula appears new compared to recent prior. Small pericardial effusion, new. If small amount of perihepatic/subdiaphragmatic ascites. There is apparent slight enhancement of the peritoneum in the right upper quadrant, in the region of ascites. Additionally, there is a pocket of ascites adjacent to the lower pole of the right kidney. Previously seen fluid adjacent to the ascending colon on the CT of 02/28/2015 has significantly decreased. No free intraperitoneal air is identified. The liver, gallbladder, spleen, adrenal  glands, kidneys, and pancreas are within normal limits. Interval sigmoidectomy and creation of a left lower quadrant colostomy. Anastomotic suture is seen in the midline pelvis on image number 77. There is no evidence of bowel obstruction. No significant fluid collection is identified in the pelvis.  There is no evidence of pelvic abscess. Urinary bladder appears normal. The uterus and adnexa are within normal limits. Bowel loops are normal in caliber. The stomach is filled with oral contrast and is nondistended. Abdominal aorta is normal in caliber. Free of atherosclerotic calcification. No acute or suspicious bony abnormality. Mild anasarca of the body wall. IMPRESSION: 1. No evidence of abdominal or pelvic abscess. Postoperative changes of partial sigmoid resection and left lower quadrant quadrant colostomy creation, and placement of 2 pelvic drains. No postoperative complicating features identified in the pelvis. 2. Small amount of right upper quadrant ascites. There is slight peritoneal enhancement in the right upper quadrant, adjacent to the liver and outlined by the ascites, of uncertain significance. A degree of peritonitis cannot be excluded given this finding. 3. Moderate right and small left pleural effusions, partially imaged, with significant bibasilar atelectasis. 4. Small pericardial effusion, new. 5. Mild body wall anasarca. Electronically Signed   By: Curlene Dolphin M.D.   On: 03/06/2015 14:27    Assessment/Plan: 59 year old female postoperative day #6 status post sigmoid colectomy with end colostomy. Breathing better after thoracentesis. Encouraging ambulation and incentive from recent. Urine culture performed yesterday was not concerning for urinary tract infection. White blood cell count remained elevated. We'll continue broad-spectrum antibiotics with vancomycin and Zosyn. At this point most likely cause of her leukocytosis is a superficial wound infection.  Clayburn Pert, MD FACS General Surgeon [facility]

## 2015-03-07 NOTE — Progress Notes (Signed)
Visited with patient this afternoon. She states she is feeling better and has improved breathing. She does complain of a new sore throat but that her appetite has returned and she would like something more than just clear liquids.  On exam her abdomen is improving with retreating cellulitis He throat shows evidence of thrush.  Will continue current antibiotics Add Nystatin swish and swallow Advance diet to full liquids.  Clayburn Pert, MD FACS General Surgeon [facility]

## 2015-03-07 NOTE — Consult Note (Signed)
WOC ostomy consult note Stoma type/location: LLQ Colostomy Stomal assessment/size: POuch intact today.  Stoma is pink and moist.  Peristomal assessment: pouch intact Treatment options for stomal/peristomal skin: Barrier ring and 1 piece flat pouch used. Output Flatus and liquid in pouch. No stool at this time.  Ostomy pouching: 1pc.flat with barrier ring Education provided: Patient is fatigued from thoracentesis.  States that her pouch blew up with gas and she opened, emptied and closed the pouch independently.  Will perform pouch change tomorrow.  Enrolled patient in [place] program: Yes [place] team will follow Natalie Moras RN BSN [facility] Pager [phone]

## 2015-03-07 NOTE — Progress Notes (Signed)
* [facility]     Assessment and Plan:  Pleural effusions.  --Likely multifactorial from volume resuscitation, and response to abdominal compartent.  --May benefit from diuresis. --I have ordered a right thoracentesis by IR. Marland Kitchen Pt has been on zosyn, therefore empyema would be less likely .   The patient has undergone right-sided thoracentesis with removal of approximately 150 mL of fluid. On review of the fluid. This appears to be an exudative effusion, likely due to sympathetic response from the abdominal compartment. The patient is already on antibiotics, her breathing is improved, she is afebrile. Therefore, no further intervention would be required in regards to this pleural effusion. Can repeat chest x-ray in a few days to make sure that is not reaccumulating.  -The patient would benefit from advancing activity, which would help with her atelectasis.  Atelectasis.  --Post op as well as compression atelectasis from effusion.  --Advance activity as tolerated.   Acute respiratory failure secondary to above.   S/p Ex-lap with colostomy due to perforated diverticulitis.     Date: 03/07/2015  MRN# [id number] Natalie [name] Oct 20, 1955   Natalie [name] is a 59 y.o. old female seen in follow up for chief complaint of  Chief Complaint  Patient presents with  . Abdominal Pain     HPI:   Patient appears mildly dyspneic, though better than yesterday. She notes that she has some pain in the right side of the chest where the thoracentesis was done that her breathing overall is doing much better.  Allergies:  Codeine and Sulfa antibiotics  Review of Systems: Gen:  Denies  fever, sweats. HEENT: Denies blurred vision. Cvc:  No dizziness, chest pain or heaviness Resp:   Denies cough or sputum porduction. Gi: Denies swallowing difficulty, stomach pain.  Gu:  Denies bladder incontinence, burning urine Ext:   No Joint pain, stiffness. Skin: No skin rash,  easy bruising. Endoc:  No polyuria, polydipsia. Psych: No depression, insomnia. Other:  All other systems were reviewed and found to be negative other than what is mentioned in the HPI.   Physical Examination:   VS: BP 114/52 mmHg  Pulse 96  Temp(Src) 98.3 F (36.8 C) (Oral)  Resp 18  Ht 5' 4.5" (1.638 m)  Wt 68.04 kg (150 lb)  BMI 25.36 kg/m2  SpO2 94%  General Appearance: No distress  Neuro:without focal findings,  speech normal,  HEENT: PERRLA, EOM intact. Pulmonary: normal breath sounds, No wheezing.   CardiovascularNormal S1,S2.  No m/r/g.   Abdomen: Benign, Soft, non-tender. Renal:  No costovertebral tenderness  GU:  Not performed at this time. Endoc: No evident thyromegaly, no signs of acromegaly. Skin:   warm, no rash. Extremities: normal, no cyanosis, clubbing.   LABORATORY PANEL:   CBC  Recent Labs Lab 03/07/15 0446  WBC 32.9*  HGB 9.8*  HCT 28.6*  PLT 307   ------------------------------------------------------------------------------------------------------------------  Chemistries   Recent Labs Lab 03/07/15 0446  NA 137  K 3.0*  CL 104  CO2 26  GLUCOSE 131*  BUN 10  CREATININE 0.74  CALCIUM 7.3*  MG 1.7   ------------------------------------------------------------------------------------------------------------------  Cardiac Enzymes No results for input(s): TROPONINI in the last 168 hours. ------------------------------------------------------------  RADIOLOGY:   No results found for this or any previous visit. Results for orders placed during the hospital encounter of 02/27/15  DG Chest 2 View   Narrative CLINICAL DATA:  Post right thoracentesis.  EXAM: CHEST  2 VIEW  COMPARISON:  03/05/2015  FINDINGS: Improved aeration  at the right lung base compatible with decreased right pleural fluid. There appears to be residual pleural fluid or volume loss at the right chest base. Again noted is a small left pleural effusion. Upper  lungs are clear. Heart is obscured by the basilar chest densities but minimally changed. Trachea is midline. Negative for a pneumothorax.  IMPRESSION: Decreased right pleural effusion without pneumothorax following thoracentesis. Volume loss and/or small residual right pleural effusion.  Persistent small left pleural effusion.   Electronically Signed   By: Markus Daft M.D.   On: 03/07/2015 11:06    ------------------------------------------------------------------------------------------------------------------  Thank  you for allowing [facility] to assist in the care of your patient. Our recommendations are noted above.  Please contact us if we can be of further service.   Marda Stalker, MD.  [facility] Office Number: [phone]  Patricia Pesa, M.D.  Vilinda Boehringer, M.D.  Merton Border, M.D

## 2015-03-08 ENCOUNTER — Inpatient Hospital Stay: Payer: Managed Care, Other (non HMO)

## 2015-03-08 DIAGNOSIS — J9601 Acute respiratory failure with hypoxia: Secondary | ICD-10-CM

## 2015-03-08 DIAGNOSIS — K631 Perforation of intestine (nontraumatic): Secondary | ICD-10-CM

## 2015-03-08 LAB — CBC
HEMATOCRIT: 30 % — AB (ref 35.0–47.0)
HEMOGLOBIN: 9.9 g/dL — AB (ref 12.0–16.0)
MCH: 30.9 pg (ref 26.0–34.0)
MCHC: 33.1 g/dL (ref 32.0–36.0)
MCV: 93.3 fL (ref 80.0–100.0)
Platelets: 353 10*3/uL (ref 150–440)
RBC: 3.21 MIL/uL — AB (ref 3.80–5.20)
RDW: 13.4 % (ref 11.5–14.5)
WBC: 25.9 10*3/uL — AB (ref 3.6–11.0)

## 2015-03-08 LAB — BASIC METABOLIC PANEL
ANION GAP: 7 (ref 5–15)
BUN: 6 mg/dL (ref 6–20)
CALCIUM: 7.2 mg/dL — AB (ref 8.9–10.3)
CO2: 23 mmol/L (ref 22–32)
Chloride: 103 mmol/L (ref 101–111)
Creatinine, Ser: 0.67 mg/dL (ref 0.44–1.00)
GLUCOSE: 130 mg/dL — AB (ref 65–99)
POTASSIUM: 3.2 mmol/L — AB (ref 3.5–5.1)
SODIUM: 133 mmol/L — AB (ref 135–145)

## 2015-03-08 LAB — TRIGLYCERIDES, BODY FLUIDS: Triglycerides, Fluid: 25 mg/dL

## 2015-03-08 LAB — PHOSPHORUS: PHOSPHORUS: 3.3 mg/dL (ref 2.5–4.6)

## 2015-03-08 LAB — MAGNESIUM: MAGNESIUM: 1.7 mg/dL (ref 1.7–2.4)

## 2015-03-08 MED ORDER — FUROSEMIDE 10 MG/ML IJ SOLN
40.0000 mg | Freq: Once | INTRAMUSCULAR | Status: AC
  Administered 2015-03-08: 40 mg via INTRAVENOUS
  Filled 2015-03-08: qty 4

## 2015-03-08 MED ORDER — FUROSEMIDE 10 MG/ML IJ SOLN
20.0000 mg | Freq: Two times a day (BID) | INTRAMUSCULAR | Status: AC
  Administered 2015-03-09 – 2015-03-11 (×5): 20 mg via INTRAVENOUS
  Filled 2015-03-08 (×6): qty 2

## 2015-03-08 MED ORDER — HYDROCERIN EX CREA
TOPICAL_CREAM | Freq: Two times a day (BID) | CUTANEOUS | Status: DC
  Administered 2015-03-08 – 2015-03-11 (×5): via TOPICAL
  Administered 2015-03-13: 1 via TOPICAL
  Administered 2015-03-14 – 2015-03-16 (×3): via TOPICAL
  Filled 2015-03-08: qty 113

## 2015-03-08 NOTE — Progress Notes (Signed)
7 Days Post-Op   Subjective:  59 year old female now 7 days status post sigmoid resection with end colostomy. Patient states that she had a better night last night than she has in many. She continues to have improved breathing and only complains of pain from her surgical site. She also complains of an itching rash to her upper back. She states she did not eat or drink much yesterday primarily because was brought to her did not taste good. She does state she is interested in having something more substantial such as mashed potatoes. She denies any fevers, chills, nausea, vomiting.  Vital signs in last 24 hours: Temp:  [98.1 F (36.7 C)-98.5 F (36.9 C)] 98.1 F (36.7 C) (12/01 0656) Pulse Rate:  [96-108] 105 (12/01 0656) Resp:  [16-20] 16 (12/01 0656) BP: (114-127)/(52-65) 123/59 mmHg (12/01 0656) SpO2:  [91 %-94 %] 94 % (12/01 0656) Last BM Date:  (pt unsure, "last week")  Intake/Output from previous day: 11/30 0701 - 12/01 0700 In: 3967.8 [P.O.:240; I.V.:3337.8; IV Piggyback:250] Out: [id number] [Urine:1050; Drains:89; Stool:25]  Physical exam: Gen.: Resting in the chair in no acute distress Chest: Clear to auscultation Heart: Tachycardic GI: Abdomen is soft, appropriately tender to palpation in her midline incision, erythematous previously notices retreating to the level of her dressing, ostomy present in left upper quadrant that is pink and patent with gas in the bag lobe of stool staining, JP drains in place in the right lower quadrant with serous output  Lab Results:  CBC  Recent Labs  03/07/15 0446 03/08/15 0634  WBC 32.9* 25.9*  HGB 9.8* 9.9*  HCT 28.6* 30.0*  PLT 307 353   CMP     Component Value Date/Time   NA 133* 03/08/2015 0634   K 3.2* 03/08/2015 0634   CL 103 03/08/2015 0634   CO2 23 03/08/2015 0634   GLUCOSE 130* 03/08/2015 0634   BUN 6 03/08/2015 0634   CREATININE 0.67 03/08/2015 0634   CALCIUM 7.2* 03/08/2015 0634   PROT 7.2 02/27/2015 2323   ALBUMIN 4.2  02/27/2015 2323   AST 22 02/27/2015 2323   ALT 14 02/27/2015 2323   ALKPHOS 68 02/27/2015 2323   BILITOT 1.0 02/27/2015 2323   GFRNONAA >60 03/08/2015 0634   GFRAA >60 03/08/2015 0634   PT/INR No results for input(s): LABPROT, INR in the last 72 hours.  Studies/Results: Dg Chest 1 View  03/08/2015  CLINICAL DATA:  Shortness of breath. EXAM: CHEST 1 VIEW COMPARISON:  03/07/2015. FINDINGS: Mediastinum is normal. Heart size is slightly more prominent on today's exam. Pulmonary venous congestion and bilateral pulmonary interstitial prominence with bilateral pleural effusions, particular prominent the right noted. Findings consistent congestive heart failure. Bilateral pneumonia cannot be excluded. No pneumothorax. IMPRESSION: Congestive heart failure with bilateral pulmonary edema. Bilateral pneumonia cannot be excluded. Bilateral pleural effusions, particular prominent on the right. Electronically Signed   By: Marcello Moores  Register   On: 03/08/2015 07:24   Dg Chest 2 View  03/07/2015  CLINICAL DATA:  Post right thoracentesis. EXAM: CHEST  2 VIEW COMPARISON:  03/05/2015 FINDINGS: Improved aeration at the right lung base compatible with decreased right pleural fluid. There appears to be residual pleural fluid or volume loss at the right chest base. Again noted is a small left pleural effusion. Upper lungs are clear. Heart is obscured by the basilar chest densities but minimally changed. Trachea is midline. Negative for a pneumothorax. IMPRESSION: Decreased right pleural effusion without pneumothorax following thoracentesis. Volume loss and/or small residual right pleural effusion.  Persistent small left pleural effusion. Electronically Signed   By: Markus Daft M.D.   On: 03/07/2015 11:06   Ct Abdomen Pelvis W Contrast  03/06/2015  CLINICAL DATA:  Elevated white blood count. The patient had colonic resection and colostomy last Thursday with 2 drains placed. Lower abdominal pain with fever and nausea. EXAM:  CT ABDOMEN AND PELVIS WITH CONTRAST TECHNIQUE: Multidetector CT imaging of the abdomen and pelvis was performed using the standard protocol following bolus administration of intravenous contrast. CONTRAST:  12mL OMNIPAQUE IOHEXOL 350 MG/ML SOLN COMPARISON:  CT abdomen pelvis 02/28/2015. Abdominal radiographs 04/30/2014. FINDINGS: Lung bases: There is at least moderate size right pleural effusion, incompletely imaged. There is significant atelectasis in the right lower lobe and to a lesser degree the right middle lobe. These pleural effusions are new compared to the recent CT abdomen pelvis. There is a small left pleural effusion or imaged, with significant left lower lobe atelectasis. Patchy airspace disease in the lingula appears new compared to recent prior. Small pericardial effusion, new. If small amount of perihepatic/subdiaphragmatic ascites. There is apparent slight enhancement of the peritoneum in the right upper quadrant, in the region of ascites. Additionally, there is a pocket of ascites adjacent to the lower pole of the right kidney. Previously seen fluid adjacent to the ascending colon on the CT of 02/28/2015 has significantly decreased. No free intraperitoneal air is identified. The liver, gallbladder, spleen, adrenal glands, kidneys, and pancreas are within normal limits. Interval sigmoidectomy and creation of a left lower quadrant colostomy. Anastomotic suture is seen in the midline pelvis on image number 77. There is no evidence of bowel obstruction. No significant fluid collection is identified in the pelvis. There is no evidence of pelvic abscess. Urinary bladder appears normal. The uterus and adnexa are within normal limits. Bowel loops are normal in caliber. The stomach is filled with oral contrast and is nondistended. Abdominal aorta is normal in caliber. Free of atherosclerotic calcification. No acute or suspicious bony abnormality. Mild anasarca of the body wall. IMPRESSION: 1. No evidence  of abdominal or pelvic abscess. Postoperative changes of partial sigmoid resection and left lower quadrant quadrant colostomy creation, and placement of 2 pelvic drains. No postoperative complicating features identified in the pelvis. 2. Small amount of right upper quadrant ascites. There is slight peritoneal enhancement in the right upper quadrant, adjacent to the liver and outlined by the ascites, of uncertain significance. A degree of peritonitis cannot be excluded given this finding. 3. Moderate right and small left pleural effusions, partially imaged, with significant bibasilar atelectasis. 4. Small pericardial effusion, new. 5. Mild body wall anasarca. Electronically Signed   By: Curlene Dolphin M.D.   On: 03/06/2015 14:27   US Thoracentesis Asp Pleural Space W/img Guide  03/07/2015  CLINICAL DATA:  Difficulty breathing and right pleural effusion. EXAM: ULTRASOUND GUIDED RIGHT THORACENTESIS COMPARISON:  None. PROCEDURE: An ultrasound guided thoracentesis was thoroughly discussed with the patient and questions answered. The benefits, risks, alternatives and complications were also discussed. The patient understands and wishes to proceed with the procedure. Written consent was obtained. Ultrasound was performed to localize and mark an adequate pocket of fluid in the right posterior chest. The area was then prepped and draped in the normal sterile fashion. 1% Lidocaine was used for local anesthesia. Under ultrasound guidance a Safe-T-Centesis catheter was introduced. Thoracentesis was performed. The catheter was removed and a dressing applied. COMPLICATIONS: None. FINDINGS: A total of approximately 850 mL of amber color fluid was removed. A fluid  sample was sent for laboratory analysis. IMPRESSION: Successful ultrasound guided right thoracentesis yielding 850 mL of pleural fluid. Electronically Signed   By: Markus Daft M.D.   On: 03/07/2015 11:08    Assessment/Plan: 59 year old female postop day #7 status  post sigmoid resection for perforated reticularis. Labs improved today and patient voiced desire for increase diet we will advance diet. Continue IV antibiotics. Encourage ambulation and incentive from her usage. Provide cream for itching rash to upper back. Lovenox for DVT prophylaxis  Clayburn Pert, MD [facility] General Surgeon [facility]

## 2015-03-08 NOTE — Progress Notes (Signed)
* [place] [facility]     Assessment and Plan:  S/p Ex-lap with colostomy due to perforated diverticulitis. , Complicated by volume overload and development of bilateral pleural effusions. Status post right-sided thoracentesis.  Pleural effusions--recurrent.  --Recurrence of right pleural effusion, likely due to volume overload. Increasing size of left-sided pleural effusion along with some mild pulmonary edema. --We'll start the patient on diuresis. -Minimize fluids. -Status post right-sided thoracentesis. -I will repeat the chest x-ray tomorrow, to check on progress.   Atelectasis.  --Post op as well as compression atelectasis from effusion.  --Advance activity as tolerated.   Acute respiratory failure secondary to above.     Date: 03/08/2015  MRN# [id number] Natalie [name] 03-Apr-1956   Natalie [name] is a 59 y.o. old female seen in follow up for chief complaint of  Chief Complaint  Patient presents with  . Abdominal Pain     HPI:   The patient has no new complaints today, she feels that her breathing is stable from yesterday. She felt that she had done better yesterday than today. In terms of her breathing.  Allergies:  Codeine and Sulfa antibiotics  Review of Systems: Gen:  Denies  fever, sweats. HEENT: Denies blurred vision. Cvc:  No dizziness, chest pain or heaviness Resp:   Denies cough or sputum porduction. Gi: Denies swallowing difficulty, stomach pain.  Gu:  Denies bladder incontinence, burning urine Ext:   No Joint pain, stiffness. Skin: No skin rash, easy bruising. Endoc:  No polyuria, polydipsia. Psych: No depression, insomnia. Other:  All other systems were reviewed and found to be negative other than what is mentioned in the HPI.   Physical Examination:   VS: BP 130/66 mmHg  Pulse 105  Temp(Src) 97.6 F (36.4 C) (Oral)  Resp 17  Ht 5' 4.5" (1.638 m)  Wt 68.04 kg (150 lb)  BMI 25.36 kg/m2  SpO2 98%  General Appearance:  No distress  Neuro:without focal findings,  speech normal,  HEENT: PERRLA, EOM intact. Pulmonary: normal breath sounds, No wheezing. Decreased air entry on the right.  CardiovascularNormal S1,S2.  No m/r/g.   Abdomen: Benign, Soft, non-tender. Renal:  No costovertebral tenderness  GU:  Not performed at this time. Endoc: No evident thyromegaly, no signs of acromegaly. Skin:   warm, no rash. Extremities: normal, no cyanosis, clubbing.   LABORATORY PANEL:   CBC  Recent Labs Lab 03/08/15 0634  WBC 25.9*  HGB 9.9*  HCT 30.0*  PLT 353   ------------------------------------------------------------------------------------------------------------------  Chemistries   Recent Labs Lab 03/08/15 0634  NA 133*  K 3.2*  CL 103  CO2 23  GLUCOSE 130*  BUN 6  CREATININE 0.67  CALCIUM 7.2*  MG 1.7   ------------------------------------------------------------------------------------------------------------------  Cardiac Enzymes No results for input(s): TROPONINI in the last 168 hours. ------------------------------------------------------------  RADIOLOGY:   No results found for this or any previous visit. Results for orders placed during the hospital encounter of 02/27/15  DG Chest 2 View   Narrative CLINICAL DATA:  Post right thoracentesis.  EXAM: CHEST  2 VIEW  COMPARISON:  03/05/2015  FINDINGS: Improved aeration at the right lung base compatible with decreased right pleural fluid. There appears to be residual pleural fluid or volume loss at the right chest base. Again noted is a small left pleural effusion. Upper lungs are clear. Heart is obscured by the basilar chest densities but minimally changed. Trachea is midline. Negative for a pneumothorax.  IMPRESSION: Decreased right pleural effusion without pneumothorax following thoracentesis. Volume  loss and/or small residual right pleural effusion.  Persistent small left pleural effusion.   Electronically  Signed   By: Markus Daft M.D.   On: 03/07/2015 11:06    ------------------------------------------------------------------------------------------------------------------  Thank  you for allowing [facility] to assist in the care of your patient. Our recommendations are noted above.  Please contact us if we can be of further service.   Marda Stalker, MD.  [facility]  Patricia Pesa, M.D.  Vilinda Boehringer, M.D.  Merton Border, M.D

## 2015-03-08 NOTE — Progress Notes (Signed)
Physical Therapy Treatment Patient Details Name: Natalie [name] MRN: [id number] DOB: [birth year] Today's Date: 03/08/2015    History of Present Illness Natalie [name] is a pleasant 59 yo F with a history of multiple prior episodes of diverticulitis who presented to [facility] ED with 1 day of worsening abdominal pain.  Began acutely in the morning and worsened in the evening around 9.  + chills.  + nausea/vomiting with CT contrast.  No recent BM.  Has had a history of chronic constipation. Has had approximately 8 episodes of diverticulitis prior, approximately 2 per year x 4 years.  Pt underwent sigmoidectomy and colostomy due to perforated diverticulitis and is POD#4 at time of PT evaluation. Pt reports fully independent community ambulation prior to admission. Fully independent with ADLs/IADLs    PT Comments    [name] during therapy session. SaO2 intermittently drops to 88% on room air during exercises so pt placed back on supplemental O2 for comfort. Pt requires seated rest breaks between exercise sets due to fatigue. HR ranges from 110-120 during exercises. Pt reports she ambulated around RN station with husband this morning and defers further ambulation with PT at this time. Agrees to seated exercises and continued ambulation later with family/nurses. Pt presents with deconditioning as well as poor cardiopulmonary endurance due to medical complications and prolonged bedrest. Pt will benefit from skilled PT services to address deficits in strength, balance, and mobility in order to return to full function at home.    Follow Up Recommendations  Home health PT     Equipment Recommendations  Rolling walker with 5" wheels    Recommendations for Other Services       Precautions / Restrictions Precautions Precautions: Fall Restrictions Weight Bearing Restrictions: No    Mobility  Bed Mobility               General bed mobility comments: Received upright in  recliner  Transfers Overall transfer level: Needs assistance Equipment used: None Transfers: Sit to/from Stand Sit to Stand: Min guard         General transfer comment: Pt performed sit to stand 2 x 5 without UE support. Decreased LE power/strength. Pt with some intermittent posterior LOB but able to self-correct  Ambulation/Gait             General Gait Details: Pt refuses ambulation at this time due to fatigue. Reports she performed full lap around RN station earlier today with husband   Stairs            Wheelchair Mobility    Modified Rankin (Stroke Patients Only)       Balance Overall balance assessment: Needs assistance Sitting-balance support: No upper extremity supported Sitting balance-Leahy Scale: Good     Standing balance support: No upper extremity supported Standing balance-Leahy Scale: Fair                      Cognition Arousal/Alertness: Awake/alert Behavior During Therapy: WFL for tasks assessed/performed Overall Cognitive Status: Within Functional Limits for tasks assessed                      Exercises General Exercises - Lower Extremity Long Arc Quad: Strengthening;Both;15 reps;Seated Heel Slides: Strengthening;Both;15 reps;Seated Hip ABduction/ADduction: Strengthening;Both;15 reps;Seated Straight Leg Raises: Strengthening;Both;15 reps;Seated Hip Flexion/Marching: Strengthening;Both;15 reps;Seated Heel Raises: Strengthening;Both;15 reps;Seated Other Exercises Other Exercises: Sit to stand 2 x 5 without UE support. Pt provided seated rest break between sets    General  Comments        Pertinent Vitals/Pain Pain Assessment: 0-10 Pain Score: 0-No pain Pain Location: Mild increase in incisional pain with seated exercises Pain Intervention(s): Monitored during session    Home Living                      Prior Function            PT Goals (current goals can now be found in the care plan section)  Acute Rehab PT Goals PT Goal Formulation: With patient Time For Goal Achievement: 03/19/15 Potential to Achieve Goals: Good Progress towards PT goals: Progressing toward goals    Frequency  Min 2X/week    PT Plan Current plan remains appropriate    Co-evaluation             End of Session Equipment Utilized During Treatment: Gait belt Activity Tolerance: Patient tolerated treatment well;Other (comment) (Requires multiple seated breaks due to DOE) Patient left: in chair;with call bell/phone within reach;with family/visitor present (RN allowing husband help pt to [facility])     Time: [id number] PT Time Calculation (min) (ACUTE ONLY): 28 min  Charges:  $Therapeutic Exercise: 23-37 mins                    G Codes:      Natalie [name] PT, DPT   Natalie [name] 03/08/2015, 2:36 PM

## 2015-03-09 ENCOUNTER — Inpatient Hospital Stay: Payer: Managed Care, Other (non HMO)

## 2015-03-09 ENCOUNTER — Ambulatory Visit
Admit: 2015-03-09 | Discharge: 2015-03-09 | Disposition: A | Discharge status: Home / Self Care | Payer: Managed Care, Other (non HMO) | Attending: Internal Medicine | Admitting: Internal Medicine

## 2015-03-09 DIAGNOSIS — R0600 Dyspnea, unspecified: Secondary | ICD-10-CM

## 2015-03-09 LAB — BASIC METABOLIC PANEL
Anion gap: 8 (ref 5–15)
BUN: 6 mg/dL (ref 6–20)
CHLORIDE: 101 mmol/L (ref 101–111)
CO2: 27 mmol/L (ref 22–32)
Calcium: 7.2 mg/dL — ABNORMAL LOW (ref 8.9–10.3)
Creatinine, Ser: 0.85 mg/dL (ref 0.44–1.00)
GFR calc non Af Amer: >60 mL/min (ref >60)
Glucose, Bld: 112 mg/dL — ABNORMAL HIGH (ref 65–99)
POTASSIUM: 3.1 mmol/L — AB (ref 3.5–5.1)
SODIUM: 136 mmol/L (ref 135–145)

## 2015-03-09 LAB — VANCOMYCIN, TROUGH: Vancomycin Tr: 15 ug/mL (ref 10–20)

## 2015-03-09 LAB — CBC
HEMATOCRIT: 28.6 % — AB (ref 35.0–47.0)
HEMOGLOBIN: 9.6 g/dL — AB (ref 12.0–16.0)
MCH: 31.2 pg (ref 26.0–34.0)
MCHC: 33.6 g/dL (ref 32.0–36.0)
MCV: 92.9 fL (ref 80.0–100.0)
Platelets: 445 10*3/uL — ABNORMAL HIGH (ref 150–440)
RBC: 3.08 MIL/uL — AB (ref 3.80–5.20)
RDW: 13.7 % (ref 11.5–14.5)
WBC: 25.4 10*3/uL — ABNORMAL HIGH (ref 3.6–11.0)

## 2015-03-09 LAB — MAGNESIUM: MAGNESIUM: 1.5 mg/dL — AB (ref 1.7–2.4)

## 2015-03-09 LAB — PHOSPHORUS: PHOSPHORUS: 3.9 mg/dL (ref 2.5–4.6)

## 2015-03-09 MED ORDER — MIDAZOLAM HCL 5 MG/5ML IJ SOLN
INTRAMUSCULAR | Status: AC
  Filled 2015-03-09: qty 5

## 2015-03-09 MED ORDER — FENTANYL CITRATE (PF) 100 MCG/2ML IJ SOLN
INTRAMUSCULAR | Status: AC
  Filled 2015-03-09: qty 2

## 2015-03-09 MED ORDER — MAGNESIUM OXIDE 400 (241.3 MG) MG PO TABS
400.0000 mg | ORAL_TABLET | Freq: Every day | ORAL | Status: DC
  Administered 2015-03-10 – 2015-03-16 (×7): 400 mg via ORAL
  Filled 2015-03-09 (×7): qty 1

## 2015-03-09 MED ORDER — POTASSIUM CHLORIDE CRYS ER 20 MEQ PO TBCR
40.0000 meq | EXTENDED_RELEASE_TABLET | Freq: Once | ORAL | Status: DC
Start: 2015-03-09 — End: 2015-03-12
  Filled 2015-03-09: qty 2

## 2015-03-09 MED ORDER — MIDAZOLAM HCL 2 MG/2ML IJ SOLN
INTRAMUSCULAR | Status: AC | PRN
  Administered 2015-03-09 (×2): 1 mg via INTRAVENOUS

## 2015-03-09 MED ORDER — FENTANYL CITRATE (PF) 100 MCG/2ML IJ SOLN
INTRAMUSCULAR | Status: AC | PRN
  Administered 2015-03-09 (×2): 50 ug via INTRAVENOUS

## 2015-03-09 NOTE — Progress Notes (Signed)
--  Repeat CXR reviewed, recurrence of right pleural effusion, will re-order right thoracentesis.  --Ordered potassium replacement.  --Continue diuresis.

## 2015-03-09 NOTE — Progress Notes (Signed)
Pt alert and oriented.  Asked to speak with surgeon to go over plan of care. Relayed concern to Dr. Adonis Huguenin. MD to follow up with pt and family.  Nurse Tech reported to nurse that pt family requested to not have bed alarm on while Husband is in room and he will assist with toileting. Pt knows to call for assist when needed. Pt currently off unit in a procedure.

## 2015-03-09 NOTE — Consult Note (Signed)
WOC ostomy follow up Stoma type/location: LLQ COlostomy Stomal assessment/size: Pouch remains intact. Stoma is edematous, approximately 1 1/2" oval. Peristomal assessment: not assessed Treatment options for stomal/peristomal skin: Barrier ring Output None.  Scant liquid in pouch.  Flatus present Ostomy pouching: 1pc.flat with barrier ring   Education provided: Patient is SOB, going for chest tube in 1/2hour and does not want pouch change. She has had no output.  Would like to change pouch when she is feeling better and more receptive to learning this new skill.  POuch is intact and no need to change today.  Spouse at bedside and agrees to wait.  Enrolled patient in [place] Start Discharge program: Yes Will not follow at this time.  Please re-consult if needed.  Domenic Moras RN BSN [place] Pager [phone]

## 2015-03-09 NOTE — Progress Notes (Signed)
Dr. Adonis Huguenin rounded on pt and addressed concerns. Dressing to abdomen changed by MD. Pt has chest tube in place, was given pain medication. Handoff report given to floor RN. Pt husband expressed concern about pt needing assistance getting up to bsc. He stated "I am not able to help her with all those tubes", reminded pt and husband that nursing staff is here to help and will assist with pt care including toiletry. Pt husband requested new pillow case from nurse aid, nurse aid to bring new case in.  Concerns addressed with pt and family. Pt resting quietly in bed.

## 2015-03-09 NOTE — Consult Note (Signed)
ANTIBIOTIC CONSULT NOTE - INITIAL  Pharmacy Consult for vancomycin Indication: wound infection  Allergies  Allergen Reactions  . Codeine Nausea Only  . Sulfa Antibiotics Rash   Patient Measurements: Height: 5' 4.5" (163.8 cm) Weight: 150 lb (68.04 kg) IBW/kg (Calculated) : 55.85 Adjusted Body Weight: 60kg  Vital Signs: Temp: 98.1 F (36.7 C) (12/02 0511) Temp Source: Oral (12/02 0511) BP: 114/57 mmHg (12/02 0511) Pulse Rate: 104 (12/02 0511) Intake/Output from previous day: 12/01 0701 - 12/02 0700 In: 2136 [P.O.:1200; I.V.:906] Out: 4990 [Urine:4875; Drains:115] Intake/Output from this shift: Total I/O In: -  Out: 200 [Urine:200]  Labs:  Recent Labs  03/07/15 0446 03/08/15 0634 03/09/15 0538  WBC 32.9* 25.9* 25.4*  HGB 9.8* 9.9* 9.6*  PLT 307 353 445*  CREATININE 0.74 0.67 0.85   Estimated Creatinine Clearance: 67.4 mL/min (by C-G formula based on Cr of 0.85).  Recent Labs  03/09/15 0714  [facility] 15     Microbiology: Recent Results (from the past 720 hour(s))  Body fluid culture     Status: None (Preliminary result)   Collection Time: 03/07/15  9:35 AM  Result Value Ref Range Status   Specimen Description PLEURAL  Final   Special Requests NONE  Final   Gram Stain MANY WBC SEEN NO ORGANISMS SEEN   Final   Culture NO GROWTH < 24 HOURS  Final   Report Status PENDING  Incomplete   Assessment: Pharmacy is dosing vancomycin in this 59 year old female who is postop day #8 status post export her laparotomy with Hartman's procedure for ruptured sigmoid diverticulitis. Patient is currently on day #10 piperacillin/tazobactam 3.375 g IV q8h EI as well as day #4 vancomycin. Patient continues to have elevated white count with a WBC of 25 but is afebrile. Serum creatinine today is 0.85 with a CrCl of ~67 mL/min.  Initial kinetics: Ke=0.058 T1/2=11 Vd=42  Patient received a loading dose of 1500 mg followed by a regimen of vancomycin 750 mg IV q 12  hours.  Vancomycin trough drawn appropriately this AM is therapeutic at 15 mcg/mL.  Goal of Therapy:  Vancomycin trough level 15-20 mcg/ml  Plan:  Measure antibiotic drug levels at steady state Follow up culture results Continue with current regimen of vancomycin 750 mg IV q 12 hours. Vancomycin trough ordered for 12/5   Natalie [name] 03/09/2015,8:32 AM

## 2015-03-09 NOTE — OR Nursing (Signed)
Dr Annamaria Boots notified pt ate at 9 am, Received SQ heparin at 0544, and hasnt had Coags ordered. No labs ordered, plan to bring to ct at 1400 for chest tube insertion. Make NPO.

## 2015-03-09 NOTE — Progress Notes (Signed)
PT Attempt Note  Patient Details Name: Natalie [name] MRN: [id number] DOB: [birth year]   Cancelled Treatment:    Reason Eval/Treat Not Completed: Patient declined, no reason specified. Chart reviewed and RN consulted. RN reports pt feeling unwell and is scheduled for repeat thoracocentesis later today. Attempted to work with patient who currently refuses reporting she is feeling unwell. Pt states she is uncomfortable with some shortness of breath. Will attempt at later date/time as pt is able/willing.  Lyndel Safe [name] PT, DPT   Kalaysia Demonbreun 03/09/2015, 9:46 AM

## 2015-03-09 NOTE — Progress Notes (Signed)
* [facility]     Assessment and Plan:  S/p Ex-lap with colostomy due to perforated diverticulitis. , Complicated by volume overload and development of bilateral pleural effusions. Status post right-sided thoracentesis.  Pleural effusions--recurrent.  --Recurrence of right pleural effusion, discussed with radiology, the patient had another ultrasound of the right pleural space, which showed a slightly loculated pleural effusion. The interventional radiologists is going to place a small bore right-sided chest tube for drainage. -Minimize fluids, continue diuresis-stop date Place for 12/4 AM. -Status post right-sided thoracentesis. -Discussed the plan in depth with the patient and family at bedside, explained risks and benefits of the proposed chest tube placement, and possible follow-up with indwelling chest tube and follow-up in the office for removal.  Empyema. -Sterile empyema, cultures negative. -Once the chest tube isn't been placed. We will continue to monitor output, may require placement of TPA/dornase.  Leukocytosis. -Presumably this is from the patient's empyema, however, if her white count does not decrease after placement of the chest tube will need to consider other causes for this increase in her white blood cell count.  Atelectasis.  --Post op as well as compression atelectasis from effusion.  --Advance activity as tolerated.   Volume overload. -Improved with diuresis, will minimize IV fluids, continue diuresis for another 24 hours.  Acute respiratory failure secondary to above.     Date: 03/09/2015  MRN# [id number] Natalie [name] 1955/04/13   Natalie [name] is a 59 y.o. old female seen in follow up for chief complaint of  Chief Complaint  Patient presents with  . Abdominal Pain     HPI:   The patient has no new complaints today, she feels that her breathing is stable from yesterday. She felt that she had done better yesterday than  today. In terms of her breathing.  Allergies:  Codeine and Sulfa antibiotics  Review of Systems: Gen:  Denies  fever, sweats. HEENT: Denies blurred vision. Cvc:  No dizziness, chest pain or heaviness Resp:   Denies cough or sputum porduction. Gi: Denies swallowing difficulty, stomach pain.  Gu:  Denies bladder incontinence, burning urine Ext:   No Joint pain, stiffness. Skin: No skin rash, easy bruising. Endoc:  No polyuria, polydipsia. Psych: No depression, insomnia. Other:  All other systems were reviewed and found to be negative other than what is mentioned in the HPI.   Physical Examination:   VS: BP 117/51 mmHg  Pulse 103  Temp(Src) 98.3 F (36.8 C) (Oral)  Resp 16  Ht 5' 4.5" (1.638 m)  Wt 68.04 kg (150 lb)  BMI 25.36 kg/m2  SpO2 92%  General Appearance: No distress  Neuro:without focal findings,  speech normal,  HEENT: PERRLA, EOM intact. Pulmonary: normal breath sounds, No wheezing. Decreased air entry on the right.  CardiovascularNormal S1,S2.  No m/r/g.   Abdomen: Benign, Soft, non-tender. Renal:  No costovertebral tenderness  GU:  Not performed at this time. Endoc: No evident thyromegaly, no signs of acromegaly. Skin:   warm, no rash. Extremities: normal, no cyanosis, clubbing.   LABORATORY PANEL:   CBC  Recent Labs Lab 03/09/15 0538  WBC 25.4*  HGB 9.6*  HCT 28.6*  PLT 445*   ------------------------------------------------------------------------------------------------------------------  Chemistries   Recent Labs Lab 03/09/15 0538  NA 136  K 3.1*  CL 101  CO2 27  GLUCOSE 112*  BUN 6  CREATININE 0.85  CALCIUM 7.2*  MG 1.5*   ------------------------------------------------------------------------------------------------------------------  Cardiac Enzymes No results for input(s): TROPONINI in the last  168 hours. ------------------------------------------------------------  RADIOLOGY:   No results found for this or any previous  visit. Results for orders placed during the hospital encounter of 02/27/15  DG Chest 2 View   Narrative CLINICAL DATA:  Post right thoracentesis.  EXAM: CHEST  2 VIEW  COMPARISON:  03/05/2015  FINDINGS: Improved aeration at the right lung base compatible with decreased right pleural fluid. There appears to be residual pleural fluid or volume loss at the right chest base. Again noted is a small left pleural effusion. Upper lungs are clear. Heart is obscured by the basilar chest densities but minimally changed. Trachea is midline. Negative for a pneumothorax.  IMPRESSION: Decreased right pleural effusion without pneumothorax following thoracentesis. Volume loss and/or small residual right pleural effusion.  Persistent small left pleural effusion.   Electronically Signed   By: Markus Daft M.D.   On: 03/07/2015 11:06    ------------------------------------------------------------------------------------------------------------------  Thank  you for allowing [facility] to assist in the care of your patient. Our recommendations are noted above.  Please contact us if we can be of further service.   Marda Stalker, MD.  [facility]  Patricia Pesa, M.D.  Vilinda Boehringer, M.D.  Merton Border, M.D

## 2015-03-09 NOTE — Care Management (Addendum)
Received call from Neoma Laming at [facility]. Intake ID is [id number]  Patient is approved for [place] with [facility] with services starting on Wednesday the 7th pending patient discharge. This could be subject to change depending on discharge date. Anticipating discharge in 2-3 days depending on condition. Patient has had medical setback and remains unstable for discharge.

## 2015-03-09 NOTE — Care Management (Signed)
Submitted home health orders and DME request through [facility] [id number] for approval. Spoke with representative Elrana. Documentation faxed to [phone]  Intake ID number is [id number]. [place] approval.

## 2015-03-09 NOTE — Progress Notes (Signed)
8 Days Post-Op   Subjective:  59 year old female 8 days status post open sigmoid resection with end colostomy. Patient states she thinks she is feeling somewhat better today. Continues to have some nausea without vomiting. She is tolerated some ice chips and a little bit of by mouth but not much. Her shortness of breath has improved some since her thoracentesis but has continued. She states that she feels her swelling is much improved after the initiation of Lasix. She has been ambulating and is up to chair.  Vital signs in last 24 hours: Temp:  [97.6 F (36.4 C)-98.1 F (36.7 C)] 98.1 F (36.7 C) (12/02 0511) Pulse Rate:  [104-112] 104 (12/02 0511) Resp:  [17-20] 18 (12/02 0511) BP: (94-132)/(57-77) 114/57 mmHg (12/02 0511) SpO2:  [93 %-98 %] 93 % (12/02 0511) Last BM Date:  (pt unsure, "last week")  Intake/Output from previous day: 12/01 0701 - 12/02 0700 In: 2136 [P.O.:1200; I.V.:906] Out: [id number]; Drains:115]  Physical exam: Gen.: Resting in no acute distress Chest: Diminished lung sounds in the right lower lobe however remainder are clear Heart: Tachycardic GI: Abdomen remains soft, appropriately tender to palpation at her surgical sites, midline remains intact with gauze packing between staples and there is erythema present from the midline going to the patient's left however does retreating, JP site in the right lower quadrant with serous output, ostomy present in the left upper quadrant with viable edematous bowel and evidence of gas within the ostomy bag but without stool.  Lab Results:  CBC  Recent Labs  03/08/15 0634 03/09/15 0538  WBC 25.9* 25.4*  HGB 9.9* 9.6*  HCT 30.0* 28.6*  PLT 353 445*   CMP     Component Value Date/Time   NA 136 03/09/2015 0538   K 3.1* 03/09/2015 0538   CL 101 03/09/2015 0538   CO2 27 03/09/2015 0538   GLUCOSE 112* 03/09/2015 0538   BUN 6 03/09/2015 0538   CREATININE 0.85 03/09/2015 0538   CALCIUM 7.2* 03/09/2015 0538   PROT 7.2 02/27/2015 2323   ALBUMIN 4.2 02/27/2015 2323   AST 22 02/27/2015 2323   ALT 14 02/27/2015 2323   ALKPHOS 68 02/27/2015 2323   BILITOT 1.0 02/27/2015 2323   GFRNONAA >60 03/09/2015 0538   GFRAA >60 03/09/2015 0538   PT/INR No results for input(s): LABPROT, INR in the last 72 hours.  Studies/Results: Dg Chest 1 View  03/09/2015  CLINICAL DATA:  Dyspnea EXAM: CHEST 1 VIEW COMPARISON:  03/08/2015 FINDINGS: Unchanged hazy opacity in the right mid and lower chest compatible with pleural effusion. There is loculation changes along the lateral chest wall and lung scalloping may have been present on abdominal CT from 2 days ago. Small left pleural effusion, stable. There is associated bibasilar atelectasis by CT. Much of the heart is obscured. No evidence of new cardiomegaly. No pneumothorax. IMPRESSION: 1. Unchanged moderate to large right and small left pleural effusions. The right effusion is partially loculated. 2. Extensive underlying atelectasis by recent abdominal CT. Electronically Signed   By: Monte Fantasia M.D.   On: 03/09/2015 07:16   Dg Chest 1 View  03/08/2015  CLINICAL DATA:  Shortness of breath. EXAM: CHEST 1 VIEW COMPARISON:  03/07/2015. FINDINGS: Mediastinum is normal. Heart size is slightly more prominent on today's exam. Pulmonary venous congestion and bilateral pulmonary interstitial prominence with bilateral pleural effusions, particular prominent the right noted. Findings consistent congestive heart failure. Bilateral pneumonia cannot be excluded. No pneumothorax. IMPRESSION: Congestive heart failure with bilateral pulmonary  edema. Bilateral pneumonia cannot be excluded. Bilateral pleural effusions, particular prominent on the right. Electronically Signed   By: Marcello Moores  Register   On: 03/08/2015 07:24   Dg Chest 2 View  03/07/2015  CLINICAL DATA:  Post right thoracentesis. EXAM: CHEST  2 VIEW COMPARISON:  03/05/2015 FINDINGS: Improved aeration at the right lung base  compatible with decreased right pleural fluid. There appears to be residual pleural fluid or volume loss at the right chest base. Again noted is a small left pleural effusion. Upper lungs are clear. Heart is obscured by the basilar chest densities but minimally changed. Trachea is midline. Negative for a pneumothorax. IMPRESSION: Decreased right pleural effusion without pneumothorax following thoracentesis. Volume loss and/or small residual right pleural effusion. Persistent small left pleural effusion. Electronically Signed   By: Markus Daft M.D.   On: 03/07/2015 11:06   US Thoracentesis Asp Pleural Space W/img Guide  03/07/2015  CLINICAL DATA:  Difficulty breathing and right pleural effusion. EXAM: ULTRASOUND GUIDED RIGHT THORACENTESIS COMPARISON:  None. PROCEDURE: An ultrasound guided thoracentesis was thoroughly discussed with the patient and questions answered. The benefits, risks, alternatives and complications were also discussed. The patient understands and wishes to proceed with the procedure. Written consent was obtained. Ultrasound was performed to localize and mark an adequate pocket of fluid in the right posterior chest. The area was then prepped and draped in the normal sterile fashion. 1% Lidocaine was used for local anesthesia. Under ultrasound guidance a Safe-T-Centesis catheter was introduced. Thoracentesis was performed. The catheter was removed and a dressing applied. COMPLICATIONS: None. FINDINGS: A total of approximately 850 mL of amber color fluid was removed. A fluid sample was sent for laboratory analysis. IMPRESSION: Successful ultrasound guided right thoracentesis yielding 850 mL of pleural fluid. Electronically Signed   By: Markus Daft M.D.   On: 03/07/2015 11:08    Assessment/Plan: 59 year old female postop day #8 status post open sigmoid resection for perforated sigmoid diverticulitis. Appreciate pulmonology assistance with pleural effusion. Patient had a repeat thoracentesis today  per pulmonology. Patient continues to slowly improve on current antibiotics. Encourage ambulation and incentive from her usage. Encourage by mouth intake but we'll continue IV fluids and IV antibiotics until bowel function has fully returned.   Clayburn Pert, MD FACS General Surgeon  03/09/2015

## 2015-03-09 NOTE — Procedures (Signed)
Successful RT CHEST TUBE INSERTION WITH CT GUIDANCE No comp Stable Clear pleural fld aspirated Full report in PACS KEEP TO LWS  AT 20CM

## 2015-03-10 ENCOUNTER — Inpatient Hospital Stay: Payer: Managed Care, Other (non HMO)

## 2015-03-10 DIAGNOSIS — Z9889 Other specified postprocedural states: Secondary | ICD-10-CM

## 2015-03-10 DIAGNOSIS — R0602 Shortness of breath: Secondary | ICD-10-CM

## 2015-03-10 DIAGNOSIS — J948 Other specified pleural conditions: Secondary | ICD-10-CM

## 2015-03-10 DIAGNOSIS — R0600 Dyspnea, unspecified: Secondary | ICD-10-CM

## 2015-03-10 LAB — BASIC METABOLIC PANEL
ANION GAP: 6 (ref 5–15)
BUN: 8 mg/dL (ref 6–20)
CALCIUM: 7.5 mg/dL — AB (ref 8.9–10.3)
CHLORIDE: 101 mmol/L (ref 101–111)
CO2: 31 mmol/L (ref 22–32)
CREATININE: 0.78 mg/dL (ref 0.44–1.00)
GFR calc non Af Amer: >60 mL/min (ref >60)
GLUCOSE: 117 mg/dL — AB (ref 65–99)
Potassium: 3.2 mmol/L — ABNORMAL LOW (ref 3.5–5.1)
Sodium: 138 mmol/L (ref 135–145)

## 2015-03-10 LAB — CBC
HEMATOCRIT: 28.8 % — AB (ref 35.0–47.0)
HEMOGLOBIN: 9.8 g/dL — AB (ref 12.0–16.0)
MCH: 31.6 pg (ref 26.0–34.0)
MCHC: 33.9 g/dL (ref 32.0–36.0)
MCV: 93.2 fL (ref 80.0–100.0)
Platelets: 526 10*3/uL — ABNORMAL HIGH (ref 150–440)
RBC: 3.09 MIL/uL — ABNORMAL LOW (ref 3.80–5.20)
RDW: 13.7 % (ref 11.5–14.5)
WBC: 19.8 10*3/uL — ABNORMAL HIGH (ref 3.6–11.0)

## 2015-03-10 LAB — MAGNESIUM: Magnesium: 1.6 mg/dL — ABNORMAL LOW (ref 1.7–2.4)

## 2015-03-10 LAB — PHOSPHORUS: Phosphorus: 3.5 mg/dL (ref 2.5–4.6)

## 2015-03-10 LAB — CREATININE, SERUM
CREATININE: 0.78 mg/dL (ref 0.44–1.00)
GFR calc Af Amer: >60 mL/min (ref >60)
GFR calc non Af Amer: >60 mL/min (ref >60)

## 2015-03-10 NOTE — Progress Notes (Signed)
Dr.Woodham notified of low BP no new orders received continue to monitor BP and will continue to notify MD if bp continues to remain low.

## 2015-03-10 NOTE — Progress Notes (Signed)
9 Days Post-Op   Subjective:  Patient had an uneventful night. She states she's feeling better today than she hasn't days. Her appetite again has returned. On complaint this morning is of pain from her midline wound. She states she coughed and feels a pulling sensation of the wound. Otherwise she continues to feel better.  Vital signs in last 24 hours: Temp:  [97.4 F (36.3 C)-98.4 F (36.9 C)] 97.4 F (36.3 C) (12/03 0527) Pulse Rate:  [93-121] 93 (12/03 0527) Resp:  [16-26] 17 (12/03 0527) BP: (89-133)/(46-78) 109/52 mmHg (12/03 0527) SpO2:  [90 %-99 %] 91 % (12/03 0527) Last BM Date:  (over 2 weeks ago)  Intake/Output from previous day: 12/02 0701 - 12/03 0700 In: 1739.7 [P.O.:120; I.V.:1519.7; IV Piggyback:100] Out: [id number] [Urine:3650; Drains:20; Chest Tube:400]  Physical exam: Gen.: Resting in bed in no acute distress Chest: Clear to auscultation, pigtail chest tube in place in the right chest draining serous fluid Heart: Regular rate and rhythm GI: Abdomen is appropriately tender to palpation in the midline. JP drain in the right lower quadrant with serous drainage. Colostomy present in the left upper quadrant with evidence of gas and fluid but no stool. Midline wound with staples in place and wound packed between staples. No purulent drainage. There is an area of erythema in the middle of the wound medial to the ostomy. The erythema also extends lateral to the ostomy. These 2 separate areas of erythema are each approximately 3 cm in diameter and much improved from prior. Marked with a marking pen today. Nondistended and without peritoneal signs.  Lab Results:  CBC  Recent Labs  03/09/15 0538 03/10/15 0703  WBC 25.4* 19.8*  HGB 9.6* 9.8*  HCT 28.6* 28.8*  PLT 445* 526*   CMP     Component Value Date/Time   NA 138 03/10/2015 0703   K 3.2* 03/10/2015 0703   CL 101 03/10/2015 0703   CO2 31 03/10/2015 0703   GLUCOSE 117* 03/10/2015 0703   BUN 8 03/10/2015 0703   CREATININE 0.78 03/10/2015 0703   CREATININE 0.78 03/10/2015 0703   CALCIUM 7.5* 03/10/2015 0703   PROT 7.2 02/27/2015 2323   ALBUMIN 4.2 02/27/2015 2323   AST 22 02/27/2015 2323   ALT 14 02/27/2015 2323   ALKPHOS 68 02/27/2015 2323   BILITOT 1.0 02/27/2015 2323   GFRNONAA >60 03/10/2015 0703   GFRNONAA >60 03/10/2015 0703   GFRAA >60 03/10/2015 0703   GFRAA >60 03/10/2015 0703   PT/INR No results for input(s): LABPROT, INR in the last 72 hours.  Studies/Results: Dg Chest 1 View  03/10/2015  CLINICAL DATA:  Dyspnea EXAM: CHEST 1 VIEW COMPARISON:  03/09/2015 FINDINGS: Right chest tube is been placed. Right pleural effusion has improved. There is no pneumothorax. Small left pleural effusion is stable. Lungs are stable in appearance. IMPRESSION: Right chest tube placed with improved right pleural effusion. Electronically Signed   By: Marybelle Killings M.D.   On: 03/10/2015 09:18   Dg Chest 1 View  03/09/2015  CLINICAL DATA:  Dyspnea EXAM: CHEST 1 VIEW COMPARISON:  03/08/2015 FINDINGS: Unchanged hazy opacity in the right mid and lower chest compatible with pleural effusion. There is loculation changes along the lateral chest wall and lung scalloping may have been present on abdominal CT from 2 days ago. Small left pleural effusion, stable. There is associated bibasilar atelectasis by CT. Much of the heart is obscured. No evidence of new cardiomegaly. No pneumothorax. IMPRESSION: 1. Unchanged moderate to large right and  small left pleural effusions. The right effusion is partially loculated. 2. Extensive underlying atelectasis by recent abdominal CT. Electronically Signed   By: Monte Fantasia M.D.   On: 03/09/2015 07:16   Ct Image Guided Drainage By Percutaneous Catheter  03/09/2015  CLINICAL DATA:  Recurrent loculated right pleural effusion, shortness of breath EXAM: CT GUIDED DRAINAGE OF LOCULATED RIGHT PLEURAL EFFUSION ANESTHESIA/SEDATION: 2.0 Mg IV Versed 100 mcg IV Fentanyl Total Moderate  Sedation Time:  15 MINUTES minutes PROCEDURE: The procedure, risks, benefits, and alternatives were explained to the patient. Questions regarding the procedure were encouraged and answered. The patient understands and consents to the procedure. The right lateral chest in the mid axillary line was prepped with ChloraPrepin a sterile fashion, and a sterile drape was applied covering the operative field. A sterile gown and sterile gloves were used for the procedure. Local anesthesia was provided with 1% Lidocaine. Previous imaging reviewed. Patient positioned supine. Noncontrast localization CT performed. The loculated right pleural effusion was localized. Under sterile conditions and local anesthesia, an 18 gauge 10 cm access needle was advanced from a lateral approach in the mid axillary line. Needle position confirmed in the pleural space with CT. Syringe aspiration yielded clear pleural fluid. Guidewire inserted followed by a 10 [place] drain. Drain catheter position confirmed with repeat CT. Catheter connected to Pleur-Evac. Catheter was secured with a Prolene suture and a sterile dressing. No immediate complication. Patient tolerated the procedure well. COMPLICATIONS: None immediate FINDINGS: Imaging confirms percutaneous needle access of the right loculated pleural effusion for drain insertion IMPRESSION: Successful CT-guided 10 French right chest tube insertion for loculated pleural effusion. Electronically Signed   By: Jerilynn Mages.  Shick M.D.   On: 03/09/2015 16:21    Assessment/Plan: Ruptured diverticulitis: Patient continues to slowly recover from her export her laparotomy for diverticulitis. Ostomy continues to have gas output and liquid output but no stool. JP drain with serous drainage that is minimal. Removed the JP drain today at the bedside. Continue to encourage ambulation, incentive spirometer use, and await return of bowel function.  Superficial wound infection: Erythema continues to decrease.  Leukocytosis continues to decrease. (Down to 19.8 from a peak of 36) Remainder of erythema Mark today with marking pen. Should erythema not continue to retreat the staple on the midline wound that is involved with erythema will be removed at the bedside tomorrow. Currently on vancomycin and Zosyn. On day 10 of Zosyn. On day 5 of vancomycin. Plan to continue antibiotics until white count has normalized.  Pleural effusion: Appreciate pulmonology consultation. Radiology placed chest tube without air leak and large volume serous output. Patient breathing more comfortably and not tachycardic for the first time in many days. Continue tube to suction while in bed or chair. We will instruct nurse okay to remove obstruction for patient to ambulate. Patient currently on Lasix per pulmonology  Thrombocytosis: Platelet count continues to rise.(Up to 526 from 445 yesterday) we'll continue to monitor with daily labs. She continues to rise may require aspirin therapy and medicine consultation.  Prophylaxis: Patient on heparin and SCDs for DVT prophylaxis and Protonix for GI prophylaxis   Clayburn Pert, MD FACS General Surgeon  03/10/2015

## 2015-03-10 NOTE — Consult Note (Signed)
ANTIBIOTIC CONSULT NOTE - INITIAL  Pharmacy Consult for vancomycin Indication: wound infection  Allergies  Allergen Reactions  . Codeine Nausea Only  . Sulfa Antibiotics Rash   Patient Measurements: Height: 5' 4.5" (163.8 cm) Weight: 150 lb (68.04 kg) IBW/kg (Calculated) : 55.85 Adjusted Body Weight: 60kg  Vital Signs: Temp: 97.4 F (36.3 C) (12/03 0527) Temp Source: Oral (12/03 0527) BP: 109/52 mmHg (12/03 0527) Pulse Rate: 93 (12/03 0527) Intake/Output from previous day: 12/02 0701 - 12/03 0700 In: 1739.7 [P.O.:120; I.V.:1519.7; IV Piggyback:100] Out: 4370 [Urine:3950; Drains:20; Chest Tube:400] Intake/Output from this shift: Total I/O In: 440 [P.O.:440] Out: 800 [Urine:800]  Labs:  Recent Labs  03/08/15 0634 03/09/15 0538 03/10/15 0703  WBC 25.9* 25.4* 19.8*  HGB 9.9* 9.6* 9.8*  PLT 353 445* 526*  CREATININE 0.67 0.85 0.78  0.78   Estimated Creatinine Clearance: 71.7 mL/min (by C-G formula based on Cr of 0.78).  Recent Labs  03/09/15 0714  [facility] 15     Microbiology: Recent Results (from the past 720 hour(s))  Body fluid culture     Status: None (Preliminary result)   Collection Time: 03/07/15  9:35 AM  Result Value Ref Range Status   Specimen Description PLEURAL  Final   Special Requests NONE  Final   Gram Stain MANY WBC SEEN NO ORGANISMS SEEN   Final   Culture NO GROWTH 3 DAYS  Final   Report Status PENDING  Incomplete  Body fluid culture     Status: None (Preliminary result)   Collection Time: 03/09/15  4:42 PM  Result Value Ref Range Status   Specimen Description CYTO PLEU  Final   Special Requests NONE  Final   Gram Stain PENDING  Incomplete   Culture NO GROWTH < 24 HOURS  Final   Report Status PENDING  Incomplete   Assessment: Pharmacy is dosing vancomycin in this 59 year old female who is  status post exploratory laparotomy with Hartman's procedure for ruptured sigmoid diverticulitis.   Goal of Therapy:  Vancomycin trough  level 15-20 mcg/ml  Plan:  Measure antibiotic drug levels at steady state Follow up culture results Continue with current regimen of vancomycin 750 mg IV q 12 hours. Vancomycin trough ordered for 12/5   Ulice Dash D 03/10/2015,12:34 PM

## 2015-03-10 NOTE — Progress Notes (Signed)
* [facility]     Assessment and Plan:  S/p Ex-lap with colostomy due to perforated diverticulitis. , Complicated by volume overload and development of bilateral pleural effusions. Status post right-sided thoracentesis and right chest tube placement.  Pleural effusions--recurrent.  --Recurrence of right pleural effusion, discussed with radiology, the patient had another ultrasound of the right pleural space, which showed a slightly loculated pleural effusion. The interventional radiologists placed a small bore right-sided chest tube for drainage. -Minimize fluids, continue diuresis-stop date Place for 12/4 AM. -Status post right-sided thoracentesis and CT placement -Discussed the plan in depth with the patient and family at bedside, explained risks and benefits of the proposed chest tube placement, and possible follow-up with indwelling chest tube and follow-up in the office for removal.  Empyema. -Sterile empyema, cultures negative. -Once the chest tube isn't been placed. We will continue to monitor output, may require placement of TPA/dornase.  Leukocytosis. -Presumably this is from the patient's empyema, however, if her white count does not decrease after placement of the chest tube will need to consider other causes for this increase in her white blood cell count.  Atelectasis.  --Post op as well as compression atelectasis from effusion.  --Advance activity as tolerated.   Volume overload. -Improved with diuresis, will minimize IV fluids, continue diuresis for another 24 hours.  Acute respiratory failure secondary to above.   Thank  you for allowing [facility] to assist in the care of your patient. Our recommendations are noted above.  Please contact us if we can be of further service or any questions on this pateint, [phone] (please enter 7-digits)  Date: 03/10/2015  MRN# [id number] Natalie [name] 1955/07/16   Natalie [name]  Natalie [name] is a 59 y.o. old female seen in follow up for chief complaint of  Chief Complaint  Patient presents with  . Abdominal Pain     HPI:   The patient has no new complaints today, she feels that her breathing is much better from yesterday. She had chest tube placed on the right yesterday.    Significant events: U\S Thoracentesis 11/29 Right chest tube placement by IR 12/3>> Allergies:  Codeine and Sulfa antibiotics  Review of Systems: Gen:  Denies  fever, sweats. HEENT: Denies blurred vision. Cvc:  No dizziness, chest pain or heaviness Resp:   Denies cough or sputum porduction. Gi: Denies swallowing difficulty, stomach pain.  Gu:  Denies bladder incontinence, burning urine Ext:   No Joint pain, stiffness. Skin: No skin rash, easy bruising. Endoc:  No polyuria, polydipsia. Psych: No depression, insomnia. Other:  All other systems were reviewed and found to be negative other than what is mentioned in the HPI.   Physical Examination:   VS: BP 109/52 mmHg  Pulse 93  Temp(Src) 97.4 F (36.3 C) (Oral)  Resp 17  Ht 5' 4.5" (1.638 m)  Wt 150 lb (68.04 kg)  BMI 25.36 kg/m2  SpO2 91%  General Appearance: No distress  Neuro:without focal findings,  speech normal,  HEENT: PERRLA, EOM intact. Pulmonary: normal breath sounds, No wheezing. Decreased air entry on the right.  CardiovascularNormal S1,S2.  No m/r/g.   Abdomen: Benign, Soft, non-tender. Renal:  No costovertebral tenderness  GU:  Not performed at this time. Endoc: No evident thyromegaly, no signs of acromegaly. Skin:   warm, no rash. Extremities: normal, no cyanosis, clubbing.   LABORATORY PANEL:   CBC  Recent Labs Lab 03/10/15 0703  WBC 19.8*  HGB 9.8*  HCT 28.8*  PLT 526*   ------------------------------------------------------------------------------------------------------------------  Chemistries   Recent Labs Lab 03/10/15 0703  NA 138  K 3.2*  CL 101  CO2 31  GLUCOSE 117*  BUN 8    CREATININE 0.78  0.78  CALCIUM 7.5*  MG 1.6*   ------------------------------------------------------------------------------------------------------------------  Cardiac Enzymes No results for input(s): TROPONINI in the last 168 hours. ------------------------------------------------------------  RADIOLOGY:   No results found for this or any previous visit. Results for orders placed during the hospital encounter of 02/27/15  DG Chest 2 View   Narrative CLINICAL DATA:  Post right thoracentesis.  EXAM: CHEST  2 VIEW  COMPARISON:  03/05/2015  FINDINGS: Improved aeration at the right lung base compatible with decreased right pleural fluid. There appears to be residual pleural fluid or volume loss at the right chest base. Again noted is a small left pleural effusion. Upper lungs are clear. Heart is obscured by the basilar chest densities but minimally changed. Trachea is midline. Negative for a pneumothorax.  IMPRESSION: Decreased right pleural effusion without pneumothorax following thoracentesis. Volume loss and/or small residual right pleural effusion.  Persistent small left pleural effusion.   Electronically Signed   By: Markus Daft M.D.   On: 03/07/2015 11:06    ------------------------------------------------------------------------------------------------------------------  Pulmonary consult time - 35 mins   Vilinda Boehringer, MD [facility] Pager [phone] (please enter 7-digits) On Call Pager - [phone] (please enter 7-digits)

## 2015-03-11 LAB — CBC
HEMATOCRIT: 27.8 % — AB (ref 35.0–47.0)
HEMOGLOBIN: 9.4 g/dL — AB (ref 12.0–16.0)
MCH: 31.5 pg (ref 26.0–34.0)
MCHC: 34 g/dL (ref 32.0–36.0)
MCV: 92.7 fL (ref 80.0–100.0)
Platelets: 606 10*3/uL — ABNORMAL HIGH (ref 150–440)
RBC: 3 MIL/uL — ABNORMAL LOW (ref 3.80–5.20)
RDW: 13.5 % (ref 11.5–14.5)
WBC: 18 10*3/uL — ABNORMAL HIGH (ref 3.6–11.0)

## 2015-03-11 LAB — BASIC METABOLIC PANEL
Anion gap: 6 (ref 5–15)
BUN: 9 mg/dL (ref 6–20)
CALCIUM: 7.3 mg/dL — AB (ref 8.9–10.3)
CO2: 30 mmol/L (ref 22–32)
Chloride: 99 mmol/L — ABNORMAL LOW (ref 101–111)
Creatinine, Ser: 0.94 mg/dL (ref 0.44–1.00)
GLUCOSE: 116 mg/dL — AB (ref 65–99)
Potassium: 3 mmol/L — ABNORMAL LOW (ref 3.5–5.1)
Sodium: 135 mmol/L (ref 135–145)

## 2015-03-11 LAB — BODY FLUID CULTURE: CULTURE: NO GROWTH

## 2015-03-11 LAB — PHOSPHORUS: Phosphorus: 3 mg/dL (ref 2.5–4.6)

## 2015-03-11 LAB — MAGNESIUM: MAGNESIUM: 1.6 mg/dL — AB (ref 1.7–2.4)

## 2015-03-11 NOTE — Progress Notes (Signed)
10 Days Post-Op   Subjective:  No acute events overnight. Continued intermittent nausea. Otherwise states she is feeling better. Breathing easier. Continued to have gas and liquid for her ostomy but no stool yet.  Vital signs in last 24 hours: Temp:  [97.9 F (36.6 C)-98.3 F (36.8 C)] 98.3 F (36.8 C) (12/04 [id number]) Pulse Rate:  [93-107] 107 (12/04 0613) Resp:  [16-18] 16 (12/04 0613) BP: (94-124)/(46-64) 111/64 mmHg (12/04 0613) SpO2:  [93 %-99 %] 93 % (12/04 [id number]) Last BM Date:  (over 2 weeks ago)  Intake/Output from previous day: 12/03 0701 - 12/04 0700 In: 2065 [P.O.:1180; I.V.:807; IV Piggyback:78] Out: 3000 [Urine:2850; Chest Tube:150]  Physical exam: Gen.: Resting in bed in no acute distress Chest: Clear to auscultation with pigtail the right chest draining serosanguineous fluid Heart: Tachycardic GI: Abdomen is soft, tender to palpation in the midline, ostomy present in the left upper quadrant with air in the bag as well as fluid with the bag mildly leaking. Erythema that was lateral to the ostomy appears to have resolved. Midline incision with staples in place loosely. Area of the middle of the incision with erythema that is somewhat improved from yesterday's exam. Prior JP sites in the right lower quadrant without spreading erythema or continued drainage. Bowel sounds are present. Mild distention of the upper abdomen.  Lab Results:  CBC  Recent Labs  03/10/15 0703 03/11/15 0635  WBC 19.8* 18.0*  HGB 9.8* 9.4*  HCT 28.8* 27.8*  PLT 526* 606*   CMP     Component Value Date/Time   NA 135 03/11/2015 0635   K 3.0* 03/11/2015 0635   CL 99* 03/11/2015 0635   CO2 30 03/11/2015 0635   GLUCOSE 116* 03/11/2015 0635   BUN 9 03/11/2015 0635   CREATININE 0.94 03/11/2015 0635   CALCIUM 7.3* 03/11/2015 0635   PROT 7.2 02/27/2015 2323   ALBUMIN 4.2 02/27/2015 2323   AST 22 02/27/2015 2323   ALT 14 02/27/2015 2323   ALKPHOS 68 02/27/2015 2323   BILITOT 1.0 02/27/2015  2323   GFRNONAA >60 03/11/2015 0635   GFRAA >60 03/11/2015 0635   PT/INR No results for input(s): LABPROT, INR in the last 72 hours.  Studies/Results: Dg Chest 1 View  03/10/2015  CLINICAL DATA:  Dyspnea EXAM: CHEST 1 VIEW COMPARISON:  03/09/2015 FINDINGS: Right chest tube is been placed. Right pleural effusion has improved. There is no pneumothorax. Small left pleural effusion is stable. Lungs are stable in appearance. IMPRESSION: Right chest tube placed with improved right pleural effusion. Electronically Signed   By: Marybelle Killings M.D.   On: 03/10/2015 09:18   Ct Image Guided Drainage By Percutaneous Catheter  03/09/2015  CLINICAL DATA:  Recurrent loculated right pleural effusion, shortness of breath EXAM: CT GUIDED DRAINAGE OF LOCULATED RIGHT PLEURAL EFFUSION ANESTHESIA/SEDATION: 2.0 Mg IV Versed 100 mcg IV Fentanyl Total Moderate Sedation Time:  15 MINUTES minutes PROCEDURE: The procedure, risks, benefits, and alternatives were explained to the patient. Questions regarding the procedure were encouraged and answered. The patient understands and consents to the procedure. The right lateral chest in the mid axillary line was prepped with ChloraPrepin a sterile fashion, and a sterile drape was applied covering the operative field. A sterile gown and sterile gloves were used for the procedure. Local anesthesia was provided with 1% Lidocaine. Previous imaging reviewed. Patient positioned supine. Noncontrast localization CT performed. The loculated right pleural effusion was localized. Under sterile conditions and local anesthesia, an 18 gauge 10 cm access needle  was advanced from a lateral approach in the mid axillary line. Needle position confirmed in the pleural space with CT. Syringe aspiration yielded clear pleural fluid. Guidewire inserted followed by a 10 [place] drain. Drain catheter position confirmed with repeat CT. Catheter connected to Pleur-Evac. Catheter was secured with a Prolene suture and a  sterile dressing. No immediate complication. Patient tolerated the procedure well. COMPLICATIONS: None immediate FINDINGS: Imaging confirms percutaneous needle access of the right loculated pleural effusion for drain insertion IMPRESSION: Successful CT-guided 10 French right chest tube insertion for loculated pleural effusion. Electronically Signed   By: Jerilynn Mages.  Shick M.D.   On: 03/09/2015 16:21    Assessment/Plan: Ruptured diverticulitis: Patient continues to slowly improve from her laparotomy for diverticulitis. Ostomy continues to have gas output and liquid output but no stool. Continue to encourage ambulation, incentive spirometer use, and await return of bowel function.  Superficial wound infection: Erythema continues to decrease. Leukocytosis continues to decrease. (Down to 62 from a peak of 36) erythema has improved since yesterday's exam. Maximal point of erythema midline incision opened with removal of staple at bedside today. Currently on vancomycin and Zosyn. On day 11 of Zosyn. On day 6 of vancomycin. Plan to continue antibiotics until white count has normalized.  Pleural effusion: Appreciate pulmonology consultation. Radiology placed chest tube without air leak and large volume serous output. Patient breathing more comfortably. Continue tube to suction while in bed or chair. Patient currently on Lasix per pulmonology  Thrombocytosis: Platelet count continues to rise.(Up to 655from 526 yesterday) we'll continue to monitor with daily labs. If she continues to rise may require aspirin therapy and medicine consultation.  Prophylaxis: Patient on heparin and SCDs for DVT prophylaxis and Protonix for GI prophylaxis   Clayburn Pert, MD FACS General Surgeon  03/11/2015

## 2015-03-12 ENCOUNTER — Inpatient Hospital Stay: Payer: Managed Care, Other (non HMO)

## 2015-03-12 LAB — CBC
HEMATOCRIT: 28 % — AB (ref 35.0–47.0)
HEMOGLOBIN: 9.4 g/dL — AB (ref 12.0–16.0)
MCH: 31.5 pg (ref 26.0–34.0)
MCHC: 33.8 g/dL (ref 32.0–36.0)
MCV: 93.2 fL (ref 80.0–100.0)
Platelets: 753 10*3/uL — ABNORMAL HIGH (ref 150–440)
RBC: 3 MIL/uL — AB (ref 3.80–5.20)
RDW: 13.6 % (ref 11.5–14.5)
WBC: 17.2 10*3/uL — ABNORMAL HIGH (ref 3.6–11.0)

## 2015-03-12 LAB — BASIC METABOLIC PANEL
ANION GAP: 5 (ref 5–15)
BUN: 7 mg/dL (ref 6–20)
CHLORIDE: 101 mmol/L (ref 101–111)
CO2: 32 mmol/L (ref 22–32)
Calcium: 7.7 mg/dL — ABNORMAL LOW (ref 8.9–10.3)
Creatinine, Ser: 0.92 mg/dL (ref 0.44–1.00)
GFR calc Af Amer: >60 mL/min (ref >60)
GFR calc non Af Amer: >60 mL/min (ref >60)
Glucose, Bld: 113 mg/dL — ABNORMAL HIGH (ref 65–99)
POTASSIUM: 3.2 mmol/L — AB (ref 3.5–5.1)
Sodium: 138 mmol/L (ref 135–145)

## 2015-03-12 LAB — MAGNESIUM: Magnesium: 1.8 mg/dL (ref 1.7–2.4)

## 2015-03-12 LAB — PHOSPHORUS: Phosphorus: 3.4 mg/dL (ref 2.5–4.6)

## 2015-03-12 LAB — VANCOMYCIN, TROUGH: Vancomycin Tr: 18 ug/mL (ref 10–20)

## 2015-03-12 MED ORDER — BISACODYL 10 MG RE SUPP
10.0000 mg | Freq: Once | RECTAL | Status: DC
  Filled 2015-03-12: qty 1

## 2015-03-12 MED ORDER — POLYETHYLENE GLYCOL 3350 17 G PO PACK
17.0000 g | PACK | Freq: Every day | ORAL | Status: DC
  Administered 2015-03-12 – 2015-03-14 (×3): 17 g via ORAL
  Filled 2015-03-12 (×3): qty 1

## 2015-03-12 MED ORDER — POTASSIUM CHLORIDE 20 MEQ PO PACK
40.0000 meq | PACK | Freq: Two times a day (BID) | ORAL | Status: DC
  Administered 2015-03-12 – 2015-03-13 (×3): 40 meq via ORAL
  Filled 2015-03-12 (×4): qty 2

## 2015-03-12 NOTE — Consult Note (Signed)
ANTIBIOTIC CONSULT NOTE - follow up  Pharmacy Consult for vancomycin Indication: wound infection  Allergies  Allergen Reactions  . Codeine Nausea Only  . Sulfa Antibiotics Rash   Patient Measurements: Height: 5' 4.5" (163.8 cm) Weight: 150 lb (68.04 kg) IBW/kg (Calculated) : 55.85 Adjusted Body Weight: 60kg  Vital Signs: Temp: 97.7 F (36.5 C) (12/05 0451) Temp Source: Oral (12/05 0451) BP: 117/50 mmHg (12/05 0451) Pulse Rate: 98 (12/05 0451) Intake/Output from previous day: 12/04 0701 - 12/05 0700 In: 1889 [P.O.:450; I.V.:1280; IV Piggyback:159] Out: [id number] [Urine:3550; Chest Tube:130] Intake/Output from this shift:    Labs:  Recent Labs  03/10/15 0703 03/11/15 0635 03/12/15 0736  WBC 19.8* 18.0* 17.2*  HGB 9.8* 9.4* 9.4*  PLT 526* 606* 753*  CREATININE 0.78  0.78 0.94 0.92   Estimated Creatinine Clearance: 62.3 mL/min (by C-G formula based on Cr of 0.92).  Recent Labs  03/12/15 0737  [facility] 18     Microbiology: Recent Results (from the past 720 hour(s))  Body fluid culture     Status: None   Collection Time: 03/07/15  9:35 AM  Result Value Ref Range Status   Specimen Description PLEURAL  Final   Special Requests NONE  Final   Gram Stain MANY WBC SEEN NO ORGANISMS SEEN   Final   Culture NO GROWTH 4 DAYS  Final   Report Status 03/11/2015 FINAL  Final  Body fluid culture     Status: None (Preliminary result)   Collection Time: 03/09/15  4:42 PM  Result Value Ref Range Status   Specimen Description CYTO PLEU  Final   Special Requests NONE  Final   Gram Stain MANY WBC SEEN NO ORGANISMS SEEN   Final   Culture NO GROWTH 2 DAYS  Final   Report Status PENDING  Incomplete   Assessment: Pharmacy is dosing vancomycin in this 59 year old female who is  status post exploratory laparotomy with Hartman's procedure for ruptured sigmoid diverticulitis.   Vanc 11/29- Zosyn (no consult) 11/23-  Goal of Therapy:  Vancomycin trough level 15-20  mcg/ml  Plan:  Measure antibiotic drug levels at steady state Follow up culture results Continue with current regimen of vancomycin 750 mg IV q 12 hours.  12/5: Vancomycin trough = 18 mcg/ml. Will continue with current dose of Vancomycin 750mg  IV q12h. Vancomycin Day 7. (also on Zosyn day 13). Follow renal fxn. Will assess for next Vancomycin trough.  Chinita Greenland PharmD Clinical Pharmacist 03/12/2015 8:58 AM

## 2015-03-12 NOTE — Progress Notes (Signed)
* [facility]     Assessment and Plan:  S/p Ex-lap with colostomy due to perforated diverticulitis. , Complicated by volume overload and development of bilateral pleural effusions. Status post right-sided thoracentesis and right chest tube placement.  Pleural effusions--recurrent. s/p pig tail catheter placement After discussion with Dr. Andres Ege leaving catheter in place for next several days   Empyema. -Sterile empyema, cultures negative. Will repeat CT chest to assess lung fields   Date: 03/12/2015  MRN# [id number] Natalie [name] 1956-02-09   Natalie [name] is a 59 y.o. old female seen in follow up for chief complaint of  Chief Complaint  Patient presents with  . Abdominal Pain     HPI:   The patient has no new complaints today, she feels that her breathing is much better CXR this am shows increased fluid, will obtain Ct chest to assess furtehr after discussion with Dr. Genevive Bi   Significant events: U\S Thoracentesis 11/29 Right chest tube placement by IR 12/3>> Allergies:  Codeine and Sulfa antibiotics  Review of Systems: Gen:  Denies  fever, sweats. HEENT: Denies blurred vision. Cvc:  No dizziness, chest pain or heaviness Resp:   Denies cough or sputum porduction. Gi: Denies swallowing difficulty, stomach pain.  Gu:  Denies bladder incontinence, burning urine Ext:   No Joint pain, stiffness. Skin: No skin rash, easy bruising. Endoc:  No polyuria, polydipsia. Psych: No depression, insomnia. Other:  All other systems were reviewed and found to be negative other than what is mentioned in the HPI.   Physical Examination:   VS: BP 117/50 mmHg  Pulse 98  Temp(Src) 97.7 F (36.5 C) (Oral)  Resp 17  Ht 5' 4.5" (1.638 m)  Wt 150 lb (68.04 kg)  BMI 25.36 kg/m2  SpO2 92%  General Appearance: No distress  Neuro:without focal findings,  speech normal,  HEENT: PERRLA, EOM intact. Pulmonary: normal breath sounds, No wheezing.  Decreased air entry on the right.  CardiovascularNormal S1,S2.  No m/r/g.   Abdomen: Benign, Soft, non-tender. Renal:  No costovertebral tenderness  GU:  Not performed at this time. Endoc: No evident thyromegaly, no signs of acromegaly. Skin:   warm, no rash. Extremities: normal, no cyanosis, clubbing.   LABORATORY PANEL:   CBC  Recent Labs Lab 03/12/15 0736  WBC 17.2*  HGB 9.4*  HCT 28.0*  PLT 753*   ------------------------------------------------------------------------------------------------------------------  Chemistries   Recent Labs Lab 03/12/15 0736  NA 138  K 3.2*  CL 101  CO2 32  GLUCOSE 113*  BUN 7  CREATININE 0.92  CALCIUM 7.7*  MG 1.8   ------------------------------------------------------------------------------------------------------------------  Cardiac Enzymes No results for input(s): TROPONINI in the last 168 hours. ------------------------------------------------------------  RADIOLOGY:   No results found for this or any previous visit. Results for orders placed during the hospital encounter of 02/27/15  DG Chest 2 View   Narrative CLINICAL DATA:  Post right thoracentesis.  EXAM: CHEST  2 VIEW  COMPARISON:  03/05/2015  FINDINGS: Improved aeration at the right lung base compatible with decreased right pleural fluid. There appears to be residual pleural fluid or volume loss at the right chest base. Again noted is a small left pleural effusion. Upper lungs are clear. Heart is obscured by the basilar chest densities but minimally changed. Trachea is midline. Negative for a pneumothorax.  IMPRESSION: Decreased right pleural effusion without pneumothorax following thoracentesis. Volume loss and/or small residual right pleural effusion.  Persistent small left pleural effusion.   Electronically Signed   By: Quita Skye  Anselm Pancoast M.D.   On: 03/07/2015 11:06     ------------------------------------------------------------------------------------------------------------------ I have personally obtained a history, examined the patient, evaluated Pertinent laboratory and RadioGraphic/imaging results, and  formulated the assessment and plan    The Patient requires high complexity decision making for assessment and support, frequent evaluation and titration of therapies.  Patient  satisfied with Plan of action and management. All questions answered  Corrin Parker, M.D.  [facility]  Medical Director [place] Director [facility]io-P[facility] Department

## 2015-03-12 NOTE — Progress Notes (Signed)
Physical Therapy Treatment Patient Details Name: Natalie [name] MRN: [id number] DOB: March 20, 1956 Today's Date: 03/12/2015    History of Present Illness Natalie [name] is a pleasant 59 yo F with a history of multiple prior episodes of diverticulitis who presented to [facility] ED with 1 day of worsening abdominal pain.  Began acutely in the morning and worsened in the evening around 9.  + chills.  + nausea/vomiting with CT contrast.  No recent BM.  Has had a history of chronic constipation. Has had approximately 8 episodes of diverticulitis prior, approximately 2 per year x 4 years.  Pt underwent sigmoidectomy and colostomy due to perforated diverticulitis and is POD#4 at time of PT evaluation. Pt reports fully independent community ambulation prior to admission. Fully independent with ADLs/IADLs    PT Comments    Pt is getting up to walk with PT and in room to BR with minor help. Has a good response to tx, with no increased discomfort or severe SOB.  Did have to pace and so did some abd breathing training in her room.     Follow Up Recommendations  Home health PT     Equipment Recommendations  Rolling walker with 5" wheels    Recommendations for Other Services       Precautions / Restrictions Precautions Precautions: Fall Precaution Comments: telemetry Restrictions Weight Bearing Restrictions: No    Mobility  Bed Mobility               General bed mobility comments: up when PT entered  Transfers Overall transfer level: Needs assistance Equipment used: Rolling walker (2 wheeled);1 person hand held assist Transfers: Sit to/from Stand;Stand Pivot Transfers Sit to Stand: Min assist (from lower surfaces) Stand pivot transfers: Min guard       General transfer comment: has better control from higher chair but at commode is more limited   Ambulation/Gait Ambulation/Gait assistance: Min assist Ambulation Distance (Feet): 250 Feet Assistive device: Rolling walker (2 wheeled);1  person hand held assist Gait Pattern/deviations: Step-through pattern;Decreased dorsiflexion - right;Decreased dorsiflexion - left;Trunk flexed;Wide base of support;Shuffle Gait velocity: Decreased Gait velocity interpretation: Below normal speed for age/gender     Stairs            Wheelchair Mobility    Modified Rankin (Stroke Patients Only)       Balance Overall balance assessment: Needs assistance Sitting-balance support: Feet supported Sitting balance-Leahy Scale: Good     Standing balance support: Bilateral upper extremity supported Standing balance-Leahy Scale: Fair                      Cognition Arousal/Alertness: Awake/alert Behavior During Therapy: WFL for tasks assessed/performed Overall Cognitive Status: Within Functional Limits for tasks assessed                      Exercises General Exercises - Lower Extremity Ankle Circles/Pumps: AROM;Both;10 reps Long Arc Quad: Strengthening;Both;10 reps Heel Slides: Strengthening;Both;10 reps Hip ABduction/ADduction: Strengthening;Both;10 reps Hip Flexion/Marching: Strengthening;Both;10 reps    General Comments General comments (skin integrity, edema, etc.): Pt is getting up to walk with husband and with nursing for short trips. has chest tube and IV that make skillled assistance more desirable      Pertinent Vitals/Pain Pain Assessment: Faces Faces Pain Scale: Hurts little more Pain Location: ribcage Pain Descriptors / Indicators: Operative site guarding Pain Intervention(s): Monitored during session;Premedicated before session    Home Living  Prior Function            PT Goals (current goals can now be found in the care plan section) Acute Rehab PT Goals Patient Stated Goal: get up to walk more Progress towards PT goals: Progressing toward goals    Frequency  Min 2X/week    PT Plan Current plan remains appropriate    Co-evaluation              End of Session Equipment Utilized During Treatment: Gait belt;Oxygen Activity Tolerance: Patient limited by lethargy;Patient limited by fatigue Patient left: in chair;with call bell/phone within reach;with nursing/sitter in room     Time: 1500-1539 PT Time Calculation (min) (ACUTE ONLY): 39 min  Charges:  $Gait Training: 8-22 mins $Therapeutic Exercise: 8-22 mins                    G Codes:      Ramond Dial 03-18-2015, 4:40 PM   Mee Hives, PT MS Acute Rehab Dept. Number: [facility] [phone] and [place] [phone]

## 2015-03-12 NOTE — Care Management (Addendum)
received call from [place] at [facility] [phone]. Patient has been approved for [facility] with [facility] care. [phone].

## 2015-03-12 NOTE — Progress Notes (Signed)
59 yr old POD#11 from Hartman's procedure for Perforated diverticulitis complicated by superficial wound infection and pleural effusion.  Patient states she's had a pretty good day, got up and moved around and had better appetite today.  Some air and mucous from ostomy but no stool.    Filed Vitals:   03/12/15 0451 03/12/15 1246  BP: 117/50 98/50  Pulse: 98 102  Temp: 97.7 F (36.5 C) 97.8 F (36.6 C)  Resp: 17 18   PE:  Gen: NAD Res: crackles in bilateral basses Cardio: RRR Abd: soft, mild distension, midline incision loosely closed with staples, small area opened good granulation tissue, erythema almost completely resolve,  Ostomy site with some irritated skin in the inferior lateral portion due to some spillage, per patient,  Ostomy itself pink, patent with some edema still, but relatively dry membrane, fascia palpable and patent through stoma, no stool but some thick mucous.  Ext: 1+ edema, 2+ pulses, no tenderness  A/P:  Perforated diverticulitis: continue vanc and zosyn, wbc trending downward, started miralax daily and given po potassium for hypokalemia, she did have significant issues with chronic constipation and slow transit on Amitiza and Linzess previously, may need more eventually to get colon moving  Pleural effusion: improving some, CT scan performed today, continue chest tube to suction, appreciate pulmonary help, will see if Dr. Genevive Bi, thoracic surgeon, thinks any other therapy needed

## 2015-03-12 NOTE — Plan of Care (Signed)
Problem: Bowel/Gastric: Goal: Will not experience complications related to bowel motility Outcome: Not Progressing Colostomy in place-no output at this time     Problem: Respiratory: Goal: Respiratory status will improve Outcome: Not Progressing Chest tube in place

## 2015-03-12 NOTE — Consult Note (Signed)
WOC ostomy follow up Stoma type/location: LLQ Colostomy Stomal assessment/size: 1 3/4" slightly oval stoma with sloughing of stoma noted today.  Denuded peristomal skin present to right side stoma.  Tender to touch.  Was using barrier ring and 1 piece flat pouch.  Abdomen had been edematous.  No stool output at this time.  Skin breakdown may have been due to abdominal swelling.   Peristomal assessment: Breakdown from 2 to 6 o'clock.  Denuded and erythematous Treatment options for stomal/peristomal skin: Dusted peritstomal skin with powder.  Crusted with tap water.  Applied 2 piece pouching system today with no barrier ring.  Output only scant liquid and flatus noted.  Ostomy pouching: 2pc. 2 1/4" pouch with stomal powder. Crusted with tap water.  Education provided: Explained rationale for stoma powder and will monitor healing of the new breakdown.  Explained that abdominal edema may have caused some medical adhesive related skin injury.  Will check daily for healing.  Enrolled patient in [place] Start Discharge program: Yes [place] team will continue to follow.  Domenic Moras RN BSN [place] Pager [phone]

## 2015-03-12 NOTE — Progress Notes (Signed)
11 Days Post-Op  Subjective: Post Hartman's procedure for perforated diverticulitis. She also has a pigtail chest tube in place for pleural effusion. She states she's feeling fairly well today with minimal pain but has not had much ostomy output.  Objective: Vital signs in last 24 hours: Temp:  [97.7 F (36.5 C)-98.4 F (36.9 C)] 97.7 F (36.5 C) (12/05 0451) Pulse Rate:  [98-113] 98 (12/05 0451) Resp:  [17-18] 17 (12/05 0451) BP: (109-125)/(48-51) 117/50 mmHg (12/05 0451) SpO2:  [90 %-92 %] 92 % (12/05 0451) Last BM Date:  (over 2 weeks ago)  Intake/Output from previous day: 12/04 0701 - 12/05 0700 In: 1889 [P.O.:450; I.V.:1280; IV Piggyback:159] Out: [id number] [Urine:3550; Chest Tube:130] Intake/Output this shift:    Physical exam:  Comfortable.  Abdomen is soft wounds are healing well there is some erythema but no purulence and the wounds are shallow. Ostomy is putting out some serosanguineous fluid but no stool there is minimal gas in the bag. Her graft calves are nontender.  Lab Results: CBC   Recent Labs  03/11/15 0635 03/12/15 0736  WBC 18.0* 17.2*  HGB 9.4* 9.4*  HCT 27.8* 28.0*  PLT 606* 753*   BMET  Recent Labs  03/11/15 0635 03/12/15 0736  NA 135 138  K 3.0* 3.2*  CL 99* 101  CO2 30 32  GLUCOSE 116* 113*  BUN 9 7  CREATININE 0.94 0.92  CALCIUM 7.3* 7.7*   PT/INR No results for input(s): LABPROT, INR in the last 72 hours. ABG No results for input(s): PHART, HCO3 in the last 72 hours.  Invalid input(s): PCO2, PO2  Studies/Results: No results found.  Anti-infectives: Anti-infectives    Start     Dose/Rate Route Frequency Ordered Stop   03/07/15 0745  vancomycin (VANCOCIN) IVPB 750 mg/150 ml premix     750 mg 150 mL/hr over 60 Minutes Intravenous Every 12 hours 03/06/15 1844     03/06/15 1845  vancomycin (VANCOCIN) 1,500 mg in sodium chloride 0.9 % 500 mL IVPB     1,500 mg 250 mL/hr over 120 Minutes Intravenous  Once 03/06/15 1844 03/06/15  2311   03/06/15 1830  vancomycin (VANCOCIN) IVPB 1000 mg/200 mL premix  Status:  Discontinued     1,000 mg 200 mL/hr over 60 Minutes Intravenous Every 12 hours 03/06/15 1820 03/06/15 1843   02/28/15 1000  piperacillin-tazobactam (ZOSYN) IVPB 3.375 g  Status:  Discontinued     3.375 g 12.5 mL/hr over 240 Minutes Intravenous 3 times per day 02/28/15 0329 02/28/15 0746   02/28/15 0815  piperacillin-tazobactam (ZOSYN) IVPB 3.375 g     3.375 g 12.5 mL/hr over 240 Minutes Intravenous 3 times per day 02/28/15 0804     02/28/15 0800  piperacillin-tazobactam (ZOSYN) IVPB 3.375 g  Status:  Discontinued     3.375 g 12.5 mL/hr over 240 Minutes Intravenous 4 times per day 02/28/15 0746 02/28/15 0804   02/28/15 0215  piperacillin-tazobactam (ZOSYN) IVPB 3.375 g     3.375 g 12.5 mL/hr over 240 Minutes Intravenous  Once 02/28/15 0209 02/28/15 0254   02/28/15 0215  vancomycin (VANCOCIN) IVPB 1000 mg/200 mL premix     1,000 mg 200 mL/hr over 60 Minutes Intravenous  Once 02/28/15 0209 02/28/15 0344   02/28/15 0211  metroNIDAZOLE (FLAGYL) 5-0.79 MG/ML-% IVPB    Comments:  [name]: cabinet override      02/28/15 0211 02/28/15 1414   02/28/15 0211  ciprofloxacin (CIPRO) 400 MG/200ML IVPB    Comments:  [name]: cabinet  override      02/28/15 0211 02/28/15 1414      Assessment/Plan: s/p Procedure(s): EXPLORATORY LAPAROTOMY, SIGMOID COLECTOMY, COLOSTOMY   I have asked Dr. Faith Rogue who I spoke to personally to see the patient concerning planning of removal of the chest tube. I will institute some attempts at stimulating her bowels and then advance diet.  Florene Glen, MD, FACS  03/12/2015

## 2015-03-12 NOTE — Care Management (Signed)
Spoke with Natalie [name] at [facility]. [facility] is setup for Wednesday visit. Will inform [facility] if patient does not discharge by Wednesday.

## 2015-03-12 NOTE — Progress Notes (Signed)
Nutrition Follow-up      INTERVENTION:  Meals and snacks: Discussed small frequent, bland, low fiber foods with pt and husband at bedside.   Medical Nutrition Supplement Therapy: will add mightyshake BID for added nutrition and yogurt snack Nutrition diet education: Discussed new colostomy diet education with pt. Materials provided and pt verbalized understanding and expect good compliance   NUTRITION DIAGNOSIS:   Food and nutrition related knowledge deficit related to acute illness as evidenced by  (new colostomy).    GOAL:   Patient will meet greater than or equal to 90% of their needs    MONITOR:    (Energy intake, Digestive system)  REASON FOR ASSESSMENT:   LOS    ASSESSMENT:       Current Nutrition: eating bites of meals, overall intake has been down for the last 2 weeks.  Feels full quickly and has frequent nausea with food smells.  Husband reports taste is "off"  following surgery (?? Medication related)   Gastrointestinal Profile: Last BM: no colostomy output, + gas   Scheduled Medications:  . antiseptic oral rinse  7 mL Mouth Rinse BID  . bisacodyl  10 mg Rectal Once  . cyclobenzaprine  10 mg Oral TID  . fluticasone  2 spray Each Nare Daily  . heparin  5,000 Units Subcutaneous 3 times per day  . hydrocerin   Topical BID  . magnesium oxide  400 mg Oral Daily  . nystatin  5 mL Oral QID  . pantoprazole (PROTONIX) IV  40 mg Intravenous QHS  . piperacillin-tazobactam (ZOSYN)  IV  3.375 g Intravenous 3 times per day  . potassium chloride  40 mEq Oral Once  . vancomycin  750 mg Intravenous Q12H    Continuous Medications:  . dextrose 5 % and 0.45 % NaCl with KCl 20 mEq/L 50 mL/hr at 03/12/15 0322     Electrolyte/Renal Profile and Glucose Profile:   Recent Labs Lab 03/10/15 0703 03/11/15 0635 03/12/15 0736  NA 138 135 138  K 3.2* 3.0* 3.2*  CL 101 99* 101  CO2 31 30 32  BUN 8 9 7   CREATININE 0.78  0.78 0.94 0.92  CALCIUM 7.5* 7.3* 7.7*   MG 1.6* 1.6* 1.8  PHOS 3.5 3.0 3.4  GLUCOSE 117* 116* 113*       Weight Trend since Admission: Filed Weights   02/27/15 2338  Weight: 150 lb (68.04 kg)      Diet Order:  DIET SOFT Room service appropriate?: Yes; Fluid consistency:: Thin  Skin:   reviewed   Height:   Ht Readings from Last 1 Encounters:  02/27/15 5' 4.5" (1.638 m)    Weight:   Wt Readings from Last 1 Encounters:  02/27/15 150 lb (68.04 kg)    Ideal Body Weight:     BMI:  Body mass index is 25.36 kg/(m^2).  Estimated Nutritional Needs:   Kcal:  BEE 1235 kcals (IF 1.0-1.2, AF 1.3) [id number] kcals/d  Protein:  (1.0-1.2 g/kg) 68-82 g/d  Fluid:  (25-25ml/kg) 1700-2055ml/d  EDUCATION NEEDS:   Education needs addressed  MODERATE Care Level  Natalie [name] B. Zenia Resides, [place], [place] (pager)

## 2015-03-13 LAB — CBC WITH DIFFERENTIAL/PLATELET
Basophils Absolute: 0.1 10*3/uL (ref 0–0.1)
Basophils Relative: 1 %
Eosinophils Absolute: 0.2 10*3/uL (ref 0–0.7)
Eosinophils Relative: 2 %
HCT: 27.9 % — ABNORMAL LOW (ref 35.0–47.0)
HEMOGLOBIN: 9.3 g/dL — AB (ref 12.0–16.0)
LYMPHS ABS: 1.2 10*3/uL (ref 1.0–3.6)
LYMPHS PCT: 8 %
MCH: 31.8 pg (ref 26.0–34.0)
MCHC: 33.3 g/dL (ref 32.0–36.0)
MCV: 95.6 fL (ref 80.0–100.0)
MONOS PCT: 8 %
Monocytes Absolute: 1.2 10*3/uL — ABNORMAL HIGH (ref 0.2–0.9)
Neutro Abs: 12.9 10*3/uL — ABNORMAL HIGH (ref 1.4–6.5)
Neutrophils Relative %: 81 %
Platelets: 700 10*3/uL — ABNORMAL HIGH (ref 150–440)
RBC: 2.92 MIL/uL — AB (ref 3.80–5.20)
RDW: 13.8 % (ref 11.5–14.5)
WBC: 15.7 10*3/uL — AB (ref 3.6–11.0)

## 2015-03-13 LAB — BODY FLUID CULTURE: Culture: NO GROWTH

## 2015-03-13 LAB — BASIC METABOLIC PANEL
Anion gap: 5 (ref 5–15)
BUN: 9 mg/dL (ref 6–20)
CHLORIDE: 100 mmol/L — AB (ref 101–111)
CO2: 29 mmol/L (ref 22–32)
Calcium: 7.6 mg/dL — ABNORMAL LOW (ref 8.9–10.3)
Creatinine, Ser: 1.17 mg/dL — ABNORMAL HIGH (ref 0.44–1.00)
GFR calc Af Amer: 57 mL/min — ABNORMAL LOW (ref >60)
GFR calc non Af Amer: 50 mL/min — ABNORMAL LOW (ref >60)
GLUCOSE: 103 mg/dL — AB (ref 65–99)
POTASSIUM: 3.5 mmol/L (ref 3.5–5.1)
Sodium: 134 mmol/L — ABNORMAL LOW (ref 135–145)

## 2015-03-13 LAB — PH, BODY FLUID: PH, BODY FLUID: 8

## 2015-03-13 MED ORDER — VANCOMYCIN HCL IN DEXTROSE 750-5 MG/150ML-% IV SOLN
750.0000 mg | INTRAVENOUS | Status: DC
  Administered 2015-03-14: 750 mg via INTRAVENOUS
  Filled 2015-03-13 (×2): qty 150

## 2015-03-13 MED ORDER — BISACODYL 10 MG RE SUPP
10.0000 mg | Freq: Two times a day (BID) | RECTAL | Status: DC
  Administered 2015-03-13: 10 mg via RECTAL

## 2015-03-13 MED ORDER — ASPIRIN 81 MG PO CHEW
81.0000 mg | CHEWABLE_TABLET | Freq: Every day | ORAL | Status: DC
Start: 2015-03-13 — End: 2015-03-16
  Administered 2015-03-13 – 2015-03-16 (×4): 81 mg via ORAL
  Filled 2015-03-13 (×4): qty 1

## 2015-03-13 MED ORDER — BISACODYL 10 MG RE SUPP
10.0000 mg | Freq: Two times a day (BID) | RECTAL | Status: DC
  Administered 2015-03-13 – 2015-03-16 (×6): 10 mg via RECTAL
  Filled 2015-03-13 (×9): qty 1

## 2015-03-13 MED ORDER — PANTOPRAZOLE SODIUM 40 MG PO TBEC
40.0000 mg | DELAYED_RELEASE_TABLET | Freq: Every day | ORAL | Status: DC
  Administered 2015-03-13 – 2015-03-15 (×3): 40 mg via ORAL
  Filled 2015-03-13 (×3): qty 1

## 2015-03-13 NOTE — Care Management (Signed)
Spoke with patient who was sitting up in bedside chair eating breakfast. Patient stated no output from ostomy at this time some Flatus. She report she has been ambulating 2 times a day with staff.  Chest tube still to wall suction right pleural effusions. Discussed having sitter come to home after discharge will provide list.

## 2015-03-13 NOTE — Progress Notes (Signed)
12 Days Post-Op  Subjective: Continued postoperative ileus. No nausea or vomiting and she is tolerating a diet but has little if any ostomy output. There was no ostomy output following Dulcolax yesterday. Dr. Azalee Course has placed the patient on MiraLAX. She also has concerns about skin breakdown in her suprapubic area  Objective: Vital signs in last 24 hours: Temp:  [97.8 F (36.6 C)-98.6 F (37 C)] 98.1 F (36.7 C) (12/06 0521) Pulse Rate:  [102-116] 103 (12/06 0521) Resp:  [18-24] 24 (12/05 2126) BP: (98-123)/(48-51) 123/51 mmHg (12/06 0521) SpO2:  [87 %-97 %] 97 % (12/06 0521) Last BM Date:  (over 2 weeks ago)  Intake/Output from previous day: 12/05 0701 - 12/06 0700 In: [id number] [P.O.:360; I.V.:1035; IV Piggyback:100] Out: 2100 [Urine:2100] Intake/Output this shift: Total I/O In: 120 [P.O.:120] Out: 500 [Urine:500]  Physical exam:  No skin breakdown but rash in the suprapubic area. Her wound itself on the midline abdomen is showing less erythema and no purulence abdomen is otherwise soft nontender there is no ostomy output. She is nondistended. But obese. As are nontender  Lab Results: CBC   Recent Labs  03/12/15 0736 03/13/15 0539  WBC 17.2* 15.7*  HGB 9.4* 9.3*  HCT 28.0* 27.9*  PLT 753* 700*   BMET  Recent Labs  03/12/15 0736 03/13/15 0539  NA 138 134*  K 3.2* 3.5  CL 101 100*  CO2 32 29  GLUCOSE 113* 103*  BUN 7 9  CREATININE 0.92 1.17*  CALCIUM 7.7* 7.6*   PT/INR No results for input(s): LABPROT, INR in the last 72 hours. ABG No results for input(s): PHART, HCO3 in the last 72 hours.  Invalid input(s): PCO2, PO2  Studies/Results: Dg Chest 1 View  03/12/2015  CLINICAL DATA:  Persistent dyspnea following colonic resection, status post thoracentesis and right-sided chest tube placement EXAM: CHEST 1 VIEW COMPARISON:  Portable chest x-ray of March 10, 2015 FINDINGS: There remains a moderate size right pleural effusion. The small caliber right-sided  chest tube is unchanged in position. There is a radiodense marker or in the chest tube that lies outside the chest wall. There is a small left pleural effusion. The hemidiaphragms remain obscured. The visualized portions of the cardiac silhouette are normal. The pulmonary vascularity is not engorged. IMPRESSION: Persistent moderate-sized right sided pleural effusion and small left pleural effusion. Correlation clinically as to the adequacy of positioning of the small caliber right chest tube is needed. Electronically Signed   By: David  Martinique M.D.   On: 03/12/2015 08:55   Ct Chest Wo Contrast  03/12/2015  CLINICAL DATA:  Status post laparoscopic colostomy secondary to perforated diverticulitis. Complicated by volume overload subsequent effusions which required RIGHT-sided thoracentesis and chest tube placement. EXAM: CT CHEST WITHOUT CONTRAST TECHNIQUE: Multidetector CT imaging of the chest was performed following the standard protocol without IV contrast. COMPARISON:  CT 03/09/2015 FINDINGS: Mediastinum/Nodes: No axillary supraclavicular lymphadenopathy. No mediastinal hilar adenopathy on this noncontrast exam. No pericardial fluid. Esophagus normal Lungs/Pleura: There is a pigtail catheter within the RIGHT lower hemi thorax laterally. There is interval decrease in volume of partially loculated fluid at the RIGHT lung base. No pneumothorax. No pulmonary edema. There is atelectasis in the RIGHT lower lobe with air bronchograms which is similar to comparison exam. No IV contrast was administered. Smaller effusion at the LEFT lung patient is more simple without loculation. There is atelectasis at the lef tlung base additionally. Upper abdomen: Two subcapsular fluid collections along the RIGHT hepatic lobe. 1 inferior to  the RIGHT hepatic lobe measures 3.8 cm (image 61, series 2). A subcapsular fluid collection along the lateral aspect the RIGHT hepatic lobe measures 4. 2 cm (image 52, series 2). IncComparison  contrast CT of 03/06/2015 these collections appear increased in volume. The gallbladder appears normal. Limited view of the pancreas is unremarkable. Limited view of the kidneys and spleen is unremarkable. Adrenal glands appear normal. Musculoskeletal: No aggressive osseous lesion. IMPRESSION: 1. Interval reduction in the pleural fluid volume following small caliber chest tube placement. There is persistent loculated pleural fluid in the RIGHT hemi thorax with associated basilar atelectasis. 2. Smaller LEFT pleural effusion is less complex. 3. Two subcapsular fluid collections along the RIGHT hepatic lobe have increased since 03/06/2015. Electronically Signed   By: Suzy Bouchard M.D.   On: 03/12/2015 16:31    Anti-infectives: Anti-infectives    Start     Dose/Rate Route Frequency Ordered Stop   03/14/15 0300  vancomycin (VANCOCIN) IVPB 750 mg/150 ml premix     750 mg 150 mL/hr over 60 Minutes Intravenous Every 18 hours 03/13/15 0932     03/07/15 0745  vancomycin (VANCOCIN) IVPB 750 mg/150 ml premix  Status:  Discontinued     750 mg 150 mL/hr over 60 Minutes Intravenous Every 12 hours 03/06/15 1844 03/13/15 0932   03/06/15 1845  vancomycin (VANCOCIN) 1,500 mg in sodium chloride 0.9 % 500 mL IVPB     1,500 mg 250 mL/hr over 120 Minutes Intravenous  Once 03/06/15 1844 03/06/15 2311   03/06/15 1830  vancomycin (VANCOCIN) IVPB 1000 mg/200 mL premix  Status:  Discontinued     1,000 mg 200 mL/hr over 60 Minutes Intravenous Every 12 hours 03/06/15 1820 03/06/15 1843   02/28/15 1000  piperacillin-tazobactam (ZOSYN) IVPB 3.375 g  Status:  Discontinued     3.375 g 12.5 mL/hr over 240 Minutes Intravenous 3 times per day 02/28/15 0329 02/28/15 0746   02/28/15 0815  piperacillin-tazobactam (ZOSYN) IVPB 3.375 g     3.375 g 12.5 mL/hr over 240 Minutes Intravenous 3 times per day 02/28/15 0804     02/28/15 0800  piperacillin-tazobactam (ZOSYN) IVPB 3.375 g  Status:  Discontinued     3.375 g 12.5 mL/hr  over 240 Minutes Intravenous 4 times per day 02/28/15 0746 02/28/15 0804   02/28/15 0215  piperacillin-tazobactam (ZOSYN) IVPB 3.375 g     3.375 g 12.5 mL/hr over 240 Minutes Intravenous  Once 02/28/15 0209 02/28/15 0254   02/28/15 0215  vancomycin (VANCOCIN) IVPB 1000 mg/200 mL premix     1,000 mg 200 mL/hr over 60 Minutes Intravenous  Once 02/28/15 0209 02/28/15 0344   02/28/15 0211  metroNIDAZOLE (FLAGYL) 5-0.79 MG/ML-% IVPB    Comments:  [name]: cabinet override      02/28/15 0211 02/28/15 1414   02/28/15 0211  ciprofloxacin (CIPRO) 400 MG/200ML IVPB    Comments:  [name]: cabinet override      02/28/15 0211 02/28/15 1414      Assessment/Plan: s/p Procedure(s): EXPLORATORY LAPAROTOMY, SIGMOID COLECTOMY, COLOSTOMY   Potassium is normalized. We will place the patient on twice a day Dulcolax suppositories via the ostomy. Discussed with Dr. Ashok Croon, MD, FACS  03/13/2015

## 2015-03-13 NOTE — Progress Notes (Signed)
PT Cancellation Note  Patient Details Name: Natalie [name] MRN: [id number] DOB: 13-Jan-1956   Cancelled Treatment:    Reason Eval/Treat Not Completed: Patient declined, no reason specified (Patient reports recently walking around unit with nursing; declines repeat efforts at this time due to fatigue.  Did encourage additional ambulation trial with RN this PM; patient agreeable.  RN informed/aware.)  Reyes Ivan. Owens Shark, PT, DPT, NCS 03/13/2015, 3:48 PM [phone]

## 2015-03-13 NOTE — Plan of Care (Signed)
Chest tube canister changed out.

## 2015-03-13 NOTE — Consult Note (Addendum)
ANTIBIOTIC CONSULT NOTE - follow up  Pharmacy Consult for Vancomycin Day 8 Indication: wound infection/s/p perforated diverticulitis  (Zosyn Day 14-no consult)  Allergies  Allergen Reactions  . Codeine Nausea Only  . Sulfa Antibiotics Rash   Patient Measurements: Height: 5' 4.5" (163.8 cm) Weight: 150 lb (68.04 kg) IBW/kg (Calculated) : 55.85 Adjusted Body Weight: 60kg  Vital Signs: Temp: 98.1 F (36.7 C) (12/06 0521) Temp Source: Oral (12/06 0521) BP: 123/51 mmHg (12/06 0521) Pulse Rate: 103 (12/06 0521) Intake/Output from previous day: 12/05 0701 - 12/06 0700 In: [id number] [P.O.:360; I.V.:1035; IV Piggyback:100] Out: 2100 [Urine:2100] Intake/Output from this shift: Total I/O In: -  Out: 500 [Urine:500]  Labs:  Recent Labs  03/11/15 0635 03/12/15 0736 03/13/15 0539  WBC 18.0* 17.2* 15.7*  HGB 9.4* 9.4* 9.3*  PLT 606* 753* 700*  CREATININE 0.94 0.92 1.17*   Estimated Creatinine Clearance: 49 mL/min (by C-G formula based on Cr of 1.17).  Recent Labs  03/12/15 0737  [facility] 18     Microbiology: Recent Results (from the past 720 hour(s))  Body fluid culture     Status: None   Collection Time: 03/07/15  9:35 AM  Result Value Ref Range Status   Specimen Description PLEURAL  Final   Special Requests NONE  Final   Gram Stain MANY WBC SEEN NO ORGANISMS SEEN   Final   Culture NO GROWTH 4 DAYS  Final   Report Status 03/11/2015 FINAL  Final  Body fluid culture     Status: None (Preliminary result)   Collection Time: 03/09/15  4:42 PM  Result Value Ref Range Status   Specimen Description CYTO PLEU  Final   Special Requests NONE  Final   Gram Stain MANY WBC SEEN NO ORGANISMS SEEN   Final   Culture NO GROWTH 3 DAYS  Final   Report Status PENDING  Incomplete   Assessment: Pharmacy is dosing vancomycin in this 59 year old female who is  status post exploratory laparotomy with Hartman's procedure for ruptured sigmoid diverticulitis.   Vanc 11/29- Zosyn (no  consult) 11/23-  Goal of Therapy:  Vancomycin trough level 15-20 mcg/ml  Plan:  Measure antibiotic drug levels at steady state Follow up culture results Continue with current regimen of vancomycin 750 mg IV q 12 hours.  12/5: Vancomycin trough = 18 mcg/ml. Will continue with current dose of Vancomycin 750mg  IV q12h. Vancomycin Day 7. (also on Zosyn day 13). Follow renal fxn. Will assess for next Vancomycin trough.  12/6: Scr increased to 1.17. Will adjust Vancomycin to 750mg  IV q18h per new PK calculations: Ke 0.042 t1/2 16.5  Vd 42 L. Will order trough for 12/8 at 1430 prior to 3rd dose of new regimen. Follow Renal fxn. Patient also on Zosyn.  Chinita Greenland PharmD Clinical Pharmacist 03/13/2015 9:32 AM

## 2015-03-13 NOTE — Progress Notes (Signed)
PHARMACIST - PHYSICIAN COMMUNICATION DR:   Azalee Course CONCERNING: IV to Oral Route Change Policy  RECOMMENDATION: This patient is receiving Pantoprazole by the intravenous route.  Based on criteria approved by the Pharmacy and Therapeutics Committee, the intravenous medication(s) is/are being converted to the equivalent oral dose form(s).   DESCRIPTION: These criteria include:  The patient is eating (either orally or via tube) and/or has been taking other orally administered medications for a least 24 hours  The patient has no evidence of active gastrointestinal bleeding or impaired GI absorption (gastrectomy, short bowel, patient on TNA or NPO).  If you have questions about this conversion, please contact the Pharmacy Department  []   [phone] )  Natalie [name] [x]   [phone] )  Natalie [name] []   [phone] )  Natalie [name] []   [phone] )  [name] []   [phone] )  [place] PharmD Clinical Pharmacist 03/13/2015

## 2015-03-14 ENCOUNTER — Encounter: Payer: Managed Care, Other (non HMO) | Admitting: Primary Care

## 2015-03-14 LAB — BASIC METABOLIC PANEL
ANION GAP: 6 (ref 5–15)
BUN: 9 mg/dL (ref 6–20)
CALCIUM: 8.1 mg/dL — AB (ref 8.9–10.3)
CO2: 27 mmol/L (ref 22–32)
Chloride: 104 mmol/L (ref 101–111)
Creatinine, Ser: 1.05 mg/dL — ABNORMAL HIGH (ref 0.44–1.00)
GFR calc Af Amer: >60 mL/min (ref >60)
GFR, EST NON AFRICAN AMERICAN: 57 mL/min — AB (ref >60)
GLUCOSE: 108 mg/dL — AB (ref 65–99)
Potassium: 4.3 mmol/L (ref 3.5–5.1)
SODIUM: 137 mmol/L (ref 135–145)

## 2015-03-14 LAB — CBC WITH DIFFERENTIAL/PLATELET
BASOS ABS: 0.1 10*3/uL (ref 0–0.1)
BASOS PCT: 1 %
EOS PCT: 1 %
Eosinophils Absolute: 0.2 10*3/uL (ref 0–0.7)
HCT: 26.9 % — ABNORMAL LOW (ref 35.0–47.0)
Hemoglobin: 9.2 g/dL — ABNORMAL LOW (ref 12.0–16.0)
Lymphocytes Relative: 7 %
Lymphs Abs: 1.1 10*3/uL (ref 1.0–3.6)
MCH: 32.4 pg (ref 26.0–34.0)
MCHC: 34.2 g/dL (ref 32.0–36.0)
MCV: 94.9 fL (ref 80.0–100.0)
MONO ABS: 1.2 10*3/uL — AB (ref 0.2–0.9)
Monocytes Relative: 7 %
NEUTROS ABS: 13.9 10*3/uL — AB (ref 1.4–6.5)
Neutrophils Relative %: 84 %
Platelets: 783 10*3/uL — ABNORMAL HIGH (ref 150–440)
RBC: 2.84 MIL/uL — ABNORMAL LOW (ref 3.80–5.20)
RDW: 13.4 % (ref 11.5–14.5)
WBC: 16.5 10*3/uL — AB (ref 3.6–11.0)

## 2015-03-14 MED ORDER — POTASSIUM CHLORIDE CRYS ER 20 MEQ PO TBCR
20.0000 meq | EXTENDED_RELEASE_TABLET | Freq: Every day | ORAL | Status: DC
  Administered 2015-03-14 – 2015-03-16 (×3): 20 meq via ORAL
  Filled 2015-03-14 (×3): qty 1

## 2015-03-14 MED ORDER — POTASSIUM CHLORIDE 20 MEQ PO PACK: 20.0000 meq | PACK | Freq: Every day | ORAL | Status: DC

## 2015-03-14 MED ORDER — LACTULOSE ENEMA
300.0000 mL | Freq: Once | ORAL | Status: AC
  Administered 2015-03-15: 300 mL via RECTAL
  Filled 2015-03-14: qty 300

## 2015-03-14 MED ORDER — POLYETHYLENE GLYCOL 3350 17 G PO PACK
17.0000 g | PACK | Freq: Two times a day (BID) | ORAL | Status: DC
  Administered 2015-03-16: 17 g via ORAL
  Filled 2015-03-14 (×4): qty 1

## 2015-03-14 MED ORDER — MINERAL OIL PO OIL
TOPICAL_OIL | Freq: Every day | ORAL | Status: DC
  Filled 2015-03-14 (×3): qty 30

## 2015-03-14 MED ORDER — FLEET ENEMA 7-19 GM/118ML RE ENEM
1.0000 | ENEMA | Freq: Once | RECTAL | Status: AC
  Administered 2015-03-14: 1 via RECTAL

## 2015-03-14 MED ORDER — LACTULOSE 10 GM/15ML PO SOLN
30.0000 g | Freq: Every day | ORAL | Status: DC
  Administered 2015-03-15 – 2015-03-16 (×3): 30 g via ORAL
  Filled 2015-03-14 (×3): qty 60

## 2015-03-14 NOTE — Progress Notes (Signed)
13 Days Post-Op  Subjective: Ongoing ileus, minimal nausea no vomiting passing lots of gas into the bag but only serous fluid in the bag no stool. Eyes abdominal pain  Objective: Vital signs in last 24 hours: Temp:  [97.5 F (36.4 C)-99.1 F (37.3 C)] 99.1 F (37.3 C) (12/07 0451) Pulse Rate:  [98-102] 99 (12/07 0451) Resp:  [17-19] 19 (12/07 0451) BP: (108-119)/(37-69) 119/61 mmHg (12/07 0451) SpO2:  [93 %-96 %] 93 % (12/07 0451) Last BM Date:  (over 2 weeks ago)  Intake/Output from previous day: 12/06 0701 - 12/07 0700 In: 1854.2 [P.O.:360; I.V.:1494.2] Out: 2560 [Urine:2560] Intake/Output this shift: Total I/O In: -  Out: 400 [Urine:400]  Physical exam:  Wound is improving with minimal erythema and no purulence. No stool in bag ostomy viable Nontender calves Lab Results: CBC   Recent Labs  03/13/15 0539 03/14/15 0408  WBC 15.7* 16.5*  HGB 9.3* 9.2*  HCT 27.9* 26.9*  PLT 700* 783*   BMET  Recent Labs  03/13/15 0539 03/14/15 0408  NA 134* 137  K 3.5 4.3  CL 100* 104  CO2 29 27  GLUCOSE 103* 108*  BUN 9 9  CREATININE 1.17* 1.05*  CALCIUM 7.6* 8.1*   PT/INR No results for input(s): LABPROT, INR in the last 72 hours. ABG No results for input(s): PHART, HCO3 in the last 72 hours.  Invalid input(s): PCO2, PO2  Studies/Results: Ct Chest Wo Contrast  03/12/2015  CLINICAL DATA:  Status post laparoscopic colostomy secondary to perforated diverticulitis. Complicated by volume overload subsequent effusions which required RIGHT-sided thoracentesis and chest tube placement. EXAM: CT CHEST WITHOUT CONTRAST TECHNIQUE: Multidetector CT imaging of the chest was performed following the standard protocol without IV contrast. COMPARISON:  CT 03/09/2015 FINDINGS: Mediastinum/Nodes: No axillary supraclavicular lymphadenopathy. No mediastinal hilar adenopathy on this noncontrast exam. No pericardial fluid. Esophagus normal Lungs/Pleura: There is a pigtail catheter within  the RIGHT lower hemi thorax laterally. There is interval decrease in volume of partially loculated fluid at the RIGHT lung base. No pneumothorax. No pulmonary edema. There is atelectasis in the RIGHT lower lobe with air bronchograms which is similar to comparison exam. No IV contrast was administered. Smaller effusion at the LEFT lung patient is more simple without loculation. There is atelectasis at the lef tlung base additionally. Upper abdomen: Two subcapsular fluid collections along the RIGHT hepatic lobe. 1 inferior to the RIGHT hepatic lobe measures 3.8 cm (image 61, series 2). A subcapsular fluid collection along the lateral aspect the RIGHT hepatic lobe measures 4. 2 cm (image 52, series 2). IncComparison contrast CT of 03/06/2015 these collections appear increased in volume. The gallbladder appears normal. Limited view of the pancreas is unremarkable. Limited view of the kidneys and spleen is unremarkable. Adrenal glands appear normal. Musculoskeletal: No aggressive osseous lesion. IMPRESSION: 1. Interval reduction in the pleural fluid volume following small caliber chest tube placement. There is persistent loculated pleural fluid in the RIGHT hemi thorax with associated basilar atelectasis. 2. Smaller LEFT pleural effusion is less complex. 3. Two subcapsular fluid collections along the RIGHT hepatic lobe have increased since 03/06/2015. Electronically Signed   By: Suzy Bouchard M.D.   On: 03/12/2015 16:31    Anti-infectives: Anti-infectives    Start     Dose/Rate Route Frequency Ordered Stop   03/14/15 0300  vancomycin (VANCOCIN) IVPB 750 mg/150 ml premix     750 mg 150 mL/hr over 60 Minutes Intravenous Every 18 hours 03/13/15 0932     03/07/15 0745  vancomycin (VANCOCIN) IVPB 750 mg/150 ml premix  Status:  Discontinued     750 mg 150 mL/hr over 60 Minutes Intravenous Every 12 hours 03/06/15 1844 03/13/15 0932   03/06/15 1845  vancomycin (VANCOCIN) 1,500 mg in sodium chloride 0.9 % 500 mL  IVPB     1,500 mg 250 mL/hr over 120 Minutes Intravenous  Once 03/06/15 1844 03/06/15 2311   03/06/15 1830  vancomycin (VANCOCIN) IVPB 1000 mg/200 mL premix  Status:  Discontinued     1,000 mg 200 mL/hr over 60 Minutes Intravenous Every 12 hours 03/06/15 1820 03/06/15 1843   02/28/15 1000  piperacillin-tazobactam (ZOSYN) IVPB 3.375 g  Status:  Discontinued     3.375 g 12.5 mL/hr over 240 Minutes Intravenous 3 times per day 02/28/15 0329 02/28/15 0746   02/28/15 0815  piperacillin-tazobactam (ZOSYN) IVPB 3.375 g     3.375 g 12.5 mL/hr over 240 Minutes Intravenous 3 times per day 02/28/15 0804     02/28/15 0800  piperacillin-tazobactam (ZOSYN) IVPB 3.375 g  Status:  Discontinued     3.375 g 12.5 mL/hr over 240 Minutes Intravenous 4 times per day 02/28/15 0746 02/28/15 0804   02/28/15 0215  piperacillin-tazobactam (ZOSYN) IVPB 3.375 g     3.375 g 12.5 mL/hr over 240 Minutes Intravenous  Once 02/28/15 0209 02/28/15 0254   02/28/15 0215  vancomycin (VANCOCIN) IVPB 1000 mg/200 mL premix     1,000 mg 200 mL/hr over 60 Minutes Intravenous  Once 02/28/15 0209 02/28/15 0344   02/28/15 0211  metroNIDAZOLE (FLAGYL) 5-0.79 MG/ML-% IVPB    Comments:  Natalie [name]: cabinet override      02/28/15 0211 02/28/15 1414   02/28/15 0211  ciprofloxacin (CIPRO) 400 MG/200ML IVPB    Comments:  Natalie [name]: cabinet override      02/28/15 0211 02/28/15 1414      Assessment/Plan: s/p Procedure(s): EXPLORATORY LAPAROTOMY, SIGMOID COLECTOMY, COLOSTOMY   Potassium corrected but still no output patient is tolerating Dulcolax twice a day and MiraLAX by mouth may need an enema via the colostomy.  Natalie Glen, MD, FACS  03/14/2015

## 2015-03-14 NOTE — Progress Notes (Signed)
59 yr old POD#13 from Hartman's procedure for Perforated diverticulitis complicated by superficial wound infection and pleural effusion.  Patient doing better, had a good day today but still no stool from ostomy.   Filed Vitals:   03/14/15 1339 03/14/15 2108  BP: 103/54 118/74  Pulse: 104 96  Temp:  97.5 F (36.4 C)  Resp: 18 20   PE:  Gen: NAD Res: crackles in bilateral basses Cardio: RRR Abd: soft, mild distension, midline wound healing well, erythema almost completely resolved, ostomy patent, membrane more moist.    Ext: 1+ edema, 2+ pulses, no tenderness  A/P:  Slow transit prior to perforated diverticulitis, likely delaying stool output as well.  Increased miralax to twice daily, started mineral oil and lactulose as well.  Discussed with patient the importance of water intake to create stool.  Will give lactulose enema through ostomy tomorrow AM (patient states last enema 3/4s of fluid did not enter into ostomy).  Will discuss with Dr. Burt Knack, my associate as well.

## 2015-03-14 NOTE — Care Management (Signed)
Contacted James at [place] to inquire as to why patient has not received DME walker.  Jeneen Rinks stated that order from [facility] did not include correct contact information. Issue corrected and DME should be delivered this afternoon. Also contacted [facility] and informed that patient will not discharge today. More to follow.

## 2015-03-14 NOTE — Consult Note (Signed)
WOC ostomy follow up Stoma type/location: LLQ COlostomy Stomal assessment/size: 1 3/4" oval pink and patent.  Much mucosa and dusky covering has shed from the stoma.  Appearance is much improved today.  Peristomal assessment: Denuded breakdown from 2 to 6 is improving.  POuch removed and changed today.  Stoma powder to this area for protection and to promote moist healing.  Treatment options for stomal/peristomal skin: stoma powder Output Liquid only in pouch May receive enema today Ostomy pouching: 2pc. 2 1/4" flat pouch with powder. Education provided: Spouse at bedside.  Participated in pouch change.  Cut and measured stoma barrier.  APplied pouch.   Enrolled patient in [facility] Start Discharge program: Yes [place] team will follow. Domenic Moras RN BSN [place] Pager [phone]

## 2015-03-15 LAB — CBC WITH DIFFERENTIAL/PLATELET
BASOS PCT: 1 %
Basophils Absolute: 0.1 10*3/uL (ref 0–0.1)
EOS ABS: 0.2 10*3/uL (ref 0–0.7)
EOS PCT: 1 %
HCT: 27.8 % — ABNORMAL LOW (ref 35.0–47.0)
Hemoglobin: 9.1 g/dL — ABNORMAL LOW (ref 12.0–16.0)
Lymphocytes Relative: 7 %
Lymphs Abs: 1 10*3/uL (ref 1.0–3.6)
MCH: 31.3 pg (ref 26.0–34.0)
MCHC: 32.6 g/dL (ref 32.0–36.0)
MCV: 95.8 fL (ref 80.0–100.0)
MONO ABS: 1.2 10*3/uL — AB (ref 0.2–0.9)
MONOS PCT: 8 %
Neutro Abs: 12.8 10*3/uL — ABNORMAL HIGH (ref 1.4–6.5)
Neutrophils Relative %: 83 %
Platelets: 848 10*3/uL — ABNORMAL HIGH (ref 150–440)
RBC: 2.9 MIL/uL — ABNORMAL LOW (ref 3.80–5.20)
RDW: 14.1 % (ref 11.5–14.5)
WBC: 15.3 10*3/uL — ABNORMAL HIGH (ref 3.6–11.0)

## 2015-03-15 LAB — BASIC METABOLIC PANEL
Anion gap: 7 (ref 5–15)
BUN: 9 mg/dL (ref 6–20)
CALCIUM: 8 mg/dL — AB (ref 8.9–10.3)
CO2: 24 mmol/L (ref 22–32)
CREATININE: 1.08 mg/dL — AB (ref 0.44–1.00)
Chloride: 105 mmol/L (ref 101–111)
GFR calc non Af Amer: 55 mL/min — ABNORMAL LOW (ref >60)
GLUCOSE: 104 mg/dL — AB (ref 65–99)
Potassium: 4.4 mmol/L (ref 3.5–5.1)
SODIUM: 136 mmol/L (ref 135–145)

## 2015-03-15 NOTE — Progress Notes (Signed)
Physical Therapy Treatment Patient Details Name: Natalie [name] MRN: [id number] DOB: 10-Nov-1955 Today's Date: 03/15/2015    History of Present Illness Natalie [name] is a pleasant 59 yo F with a history of multiple prior episodes of diverticulitis who presented to [facility] ED with 1 day of worsening abdominal pain.  Began acutely in the morning and worsened in the evening around 9.  + chills.  + nausea/vomiting with CT contrast.  No recent BM.  Has had a history of chronic constipation. Has had approximately 8 episodes of diverticulitis prior, approximately 2 per year x 4 years.  Pt underwent sigmoidectomy and colostomy due to perforated diverticulitis and is POD#4 at time of PT evaluation. Pt reports fully independent community ambulation prior to admission. Fully independent with ADLs/IADLs    PT Comments    Good participation/effort; tolerating all activities on RA with sats >94%.  Progressing towards all PT goals without difficulty; continues to prefer RW for longer-distance gait due for energy conservation and overall safety.   Follow Up Recommendations  No PT follow up     Equipment Recommendations  Rolling walker with 5" wheels    Recommendations for Other Services       Precautions / Restrictions Precautions Precautions: Fall Precaution Comments: R chest tube Restrictions Weight Bearing Restrictions: No    Mobility  Bed Mobility               General bed mobility comments: seated in recliner beginning/end of session  Transfers Overall transfer level: Needs assistance Equipment used:  (performed with and without assist device) Transfers: Sit to/from Stand Sit to Stand: Modified independent (Device/Increase time)            Ambulation/Gait Ambulation/Gait assistance: Supervision;Modified independent (Device/Increase time) Ambulation Distance (Feet): 250 Feet Assistive device: Rolling walker (2 wheeled)       General Gait Details: reciprocal stepping  pattern with good step height/length; fair cadence and gait speed without buckling or LOB.  Sats maintained >92% on RA with exertion.  BORG 4/10   Stairs            Wheelchair Mobility    Modified Rankin (Stroke Patients Only)       Balance                                    Cognition Arousal/Alertness: Awake/alert Behavior During Therapy: WFL for tasks assessed/performed Overall Cognitive Status: Within Functional Limits for tasks assessed                      Exercises Other Exercises Other Exercises: Standing LE therex, 1x12, AROM for muscular strength/endurance: heel raises, mini squats hip flex/ext/abduct/adduct.  L HHA throughout for safety, esp with periods of modified SLS.    General Comments        Pertinent Vitals/Pain Pain Assessment: No/denies pain    Home Living                      Prior Function            PT Goals (current goals can now be found in the care plan section) Acute Rehab PT Goals Patient Stated Goal: get up to walk more PT Goal Formulation: With patient Time For Goal Achievement: 03/19/15 Potential to Achieve Goals: Good Progress towards PT goals: Progressing toward goals    Frequency  Min 2X/week    PT Plan  Current plan remains appropriate    Co-evaluation             End of Session   Activity Tolerance: Patient tolerated treatment well Patient left: in chair;with call bell/phone within reach;with family/visitor present (RN informed/aware of end of session for reconnection of CT to wall suction)     Time: [id number] PT Time Calculation (min) (ACUTE ONLY): 20 min  Charges:  $Gait Training: 8-22 mins                    G Codes:      Natalie [name], PT, DPT, NCS 03/15/2015, 1:06 PM [phone]

## 2015-03-16 ENCOUNTER — Inpatient Hospital Stay: Payer: Managed Care, Other (non HMO)

## 2015-03-16 LAB — CBC
HCT: 27.5 % — ABNORMAL LOW (ref 35.0–47.0)
Hemoglobin: 9.1 g/dL — ABNORMAL LOW (ref 12.0–16.0)
MCH: 31.6 pg (ref 26.0–34.0)
MCHC: 33.1 g/dL (ref 32.0–36.0)
MCV: 95.4 fL (ref 80.0–100.0)
PLATELETS: 802 10*3/uL — AB (ref 150–440)
RBC: 2.88 MIL/uL — ABNORMAL LOW (ref 3.80–5.20)
RDW: 13.7 % (ref 11.5–14.5)
WBC: 11.2 10*3/uL — AB (ref 3.6–11.0)

## 2015-03-16 MED ORDER — PANTOPRAZOLE SODIUM 40 MG PO TBEC: 40.0000 mg | DELAYED_RELEASE_TABLET | Freq: Every day | ORAL | Status: DC

## 2015-03-16 MED ORDER — OXYCODONE-ACETAMINOPHEN 7.5-325 MG PO TABS: 1.0000 | ORAL_TABLET | ORAL | Status: DC | PRN

## 2015-03-16 MED ORDER — AMOXICILLIN-POT CLAVULANATE 875-125 MG PO TABS: 1.0000 | ORAL_TABLET | Freq: Two times a day (BID) | ORAL | Status: DC

## 2015-03-16 MED ORDER — ASPIRIN 81 MG PO CHEW: 81.0000 mg | CHEWABLE_TABLET | Freq: Every day | ORAL | Status: DC

## 2015-03-16 MED ORDER — POLYETHYLENE GLYCOL 3350 17 G PO PACK: 17.0000 g | PACK | Freq: Two times a day (BID) | ORAL | Status: DC

## 2015-03-16 MED ORDER — MAGNESIUM OXIDE 400 (241.3 MG) MG PO TABS: 400.0000 mg | ORAL_TABLET | Freq: Every day | ORAL | Status: DC

## 2015-03-16 MED ORDER — LACTULOSE 10 GM/15ML PO SOLN: 30.0000 g | Freq: Every day | ORAL | Status: DC

## 2015-03-16 MED ORDER — CIPROFLOXACIN HCL 500 MG PO TABS: 500.0000 mg | ORAL_TABLET | Freq: Two times a day (BID) | ORAL | Status: DC

## 2015-03-16 MED ORDER — POTASSIUM CHLORIDE CRYS ER 20 MEQ PO TBCR: 20.0000 meq | EXTENDED_RELEASE_TABLET | Freq: Every day | ORAL | Status: DC

## 2015-03-16 MED ORDER — NYSTATIN 100000 UNIT/ML MT SUSP: 5.0000 mL | Freq: Four times a day (QID) | OROMUCOSAL | Status: DC

## 2015-03-16 NOTE — Progress Notes (Signed)
Pt stable. IV removed. Drsg changed and education on how to perform drsg done with family present and pt helping. Drsg supplies given. Ostomy care and education provided. Ostomy supplies given. D/c instructions given and education provided. Signed prescriptions given. Pt dressed and will be escorted out.

## 2015-03-16 NOTE — Consult Note (Signed)
WOC ostomy follow up Stoma type/location: LLQ Colostomy Stomal assessment/size: 1 3/4" oval  Pink moist  Peristomal assessment: Denuded skin, resolving.  Treatment options for stomal/peristomal skin: Stoma powder Output None in pouch.  Has had suppository and oral laxative Ostomy pouching: 2pc. 2 1/4" 2 piece pouch  Education provided: Spouse at bedside.  Given discharge pouches.  Has received secure start kit. HH to begin next week. Reviewed basics of pouch change and maintenance. Written materials provided. Enrolled patient in [place] Start Discharge program: Yes Will not follow at this time.  Please re-consult if needed.  Domenic Moras RN BSN [place] Pager [phone]

## 2015-03-16 NOTE — Care Management (Signed)
Spoke with [place] at [facility] [phone]. To inform of patient discharge today. Spoke with patient and husband who were ambulating in the hallway. Patient husband gave me his personal cell number to give to [facility]. [phone]. Gave this number to [place] at [facility].

## 2015-03-16 NOTE — Discharge Instructions (Signed)
Resume home medications and new medications as prescribed. Normal saline wet to dry dressing twice a day on midline abdominal wound Routine ostomy care Home health order to assist with dressing and ostomy care May shower Follow-up Dr. Azalee Course in 10 days

## 2015-03-16 NOTE — Progress Notes (Signed)
15 Days Post-Op  Subjective: Status post Hartman's procedure. Patient is feeling much better now that she had multiple bowel movements through the colostomy. She has no shortness of breath and is feeling overall much better.  Objective: Vital signs in last 24 hours: Temp:  [97.6 F (36.4 C)-98.4 F (36.9 C)] 98.2 F (36.8 C) (12/09 0432) Pulse Rate:  [103-121] 105 (12/09 0432) Resp:  [14-18] 18 (12/09 0432) BP: (117-132)/(39-52) 117/51 mmHg (12/09 0432) SpO2:  [92 %-97 %] 93 % (12/09 0432) Last BM Date: 03/16/15  Intake/Output from previous day: 12/08 0701 - 12/09 0700 In: 1850 [P.O.:480; I.V.:1170; IV Piggyback:200] Out: 3100 [Urine:2650; Stool:450] Intake/Output this shift: Total I/O In: 145 [I.V.:95; IV Piggyback:50] Out: 30 [Stool:30]  Physical exam:  Abdomen is soft and nontender wound is much improved as well ostomy is functional. Right pigtail chest tube catheter is in place  Lab Results: CBC   Recent Labs  03/15/15 0412 03/16/15 0425  WBC 15.3* 11.2*  HGB 9.1* 9.1*  HCT 27.8* 27.5*  PLT 848* 802*   BMET  Recent Labs  03/14/15 0408 03/15/15 0412  NA 137 136  K 4.3 4.4  CL 104 105  CO2 27 24  GLUCOSE 108* 104*  BUN 9 9  CREATININE 1.05* 1.08*  CALCIUM 8.1* 8.0*   PT/INR No results for input(s): LABPROT, INR in the last 72 hours. ABG No results for input(s): PHART, HCO3 in the last 72 hours.  Invalid input(s): PCO2, PO2  Studies/Results: Dg Chest Port 1 View  03/16/2015  CLINICAL DATA:  Right pleural effusion EXAM: PORTABLE CHEST 1 VIEW COMPARISON:  CT scan of the chest and chest x-ray of March 12, 2015 FINDINGS: There remains a moderate size right pleural effusion. The small caliber right-sided chest tube is unchanged in position. There is no pneumothorax. The left lung is well-expanded. There is a small left pleural effusion. The pulmonary vascularity is not engorged. The left heart border is normal. IMPRESSION: Allowing for slight  differences in positioning there has not been significant interval change in the appearance of the chest. There remains a moderate size right pleural effusion with small left pleural effusion. Known atelectatic right lower lobe is present from the earlier CT scan. Electronically Signed   By: David  Martinique M.D.   On: 03/16/2015 07:30    Anti-infectives: Anti-infectives    Start     Dose/Rate Route Frequency Ordered Stop   03/14/15 0300  vancomycin (VANCOCIN) IVPB 750 mg/150 ml premix  Status:  Discontinued     750 mg 150 mL/hr over 60 Minutes Intravenous Every 18 hours 03/13/15 0932 03/14/15 1408   03/07/15 0745  vancomycin (VANCOCIN) IVPB 750 mg/150 ml premix  Status:  Discontinued     750 mg 150 mL/hr over 60 Minutes Intravenous Every 12 hours 03/06/15 1844 03/13/15 0932   03/06/15 1845  vancomycin (VANCOCIN) 1,500 mg in sodium chloride 0.9 % 500 mL IVPB     1,500 mg 250 mL/hr over 120 Minutes Intravenous  Once 03/06/15 1844 03/06/15 2311   03/06/15 1830  vancomycin (VANCOCIN) IVPB 1000 mg/200 mL premix  Status:  Discontinued     1,000 mg 200 mL/hr over 60 Minutes Intravenous Every 12 hours 03/06/15 1820 03/06/15 1843   02/28/15 1000  piperacillin-tazobactam (ZOSYN) IVPB 3.375 g  Status:  Discontinued     3.375 g 12.5 mL/hr over 240 Minutes Intravenous 3 times per day 02/28/15 0329 02/28/15 0746   02/28/15 0815  piperacillin-tazobactam (ZOSYN) IVPB 3.375 g  3.375 g 12.5 mL/hr over 240 Minutes Intravenous 3 times per day 02/28/15 0804     02/28/15 0800  piperacillin-tazobactam (ZOSYN) IVPB 3.375 g  Status:  Discontinued     3.375 g 12.5 mL/hr over 240 Minutes Intravenous 4 times per day 02/28/15 0746 02/28/15 0804   02/28/15 0215  piperacillin-tazobactam (ZOSYN) IVPB 3.375 g     3.375 g 12.5 mL/hr over 240 Minutes Intravenous  Once 02/28/15 0209 02/28/15 0254   02/28/15 0215  vancomycin (VANCOCIN) IVPB 1000 mg/200 mL premix     1,000 mg 200 mL/hr over 60 Minutes Intravenous  Once  02/28/15 0209 02/28/15 0344   02/28/15 0211  metroNIDAZOLE (FLAGYL) 5-0.79 MG/ML-% IVPB    Comments:  [name]: cabinet override      02/28/15 0211 02/28/15 1414   02/28/15 0211  ciprofloxacin (CIPRO) 400 MG/200ML IVPB    Comments:  [name]: cabinet override      02/28/15 0211 02/28/15 1414      Assessment/Plan: s/p Procedure(s): EXPLORATORY LAPAROTOMY, SIGMOID COLECTOMY, COLOSTOMY   Chest x-ray and labs reviewed. Chest catheter is removed at this point and a dressing placed. Likely arrange for discharge later on today if she is feeling well she will go home on oral antibiotics.  Florene Glen, MD, FACS  03/16/2015

## 2015-03-16 NOTE — Progress Notes (Signed)
Nutrition Follow-up     INTERVENTION:  Meals and snacks: Cater to pt preferences, monitor intake.  Medical Nutrition Supplement Therapy: continue mightyshake for added nutrition. Discussed ensure/boost as option for outpatient until intake improved Nutrition diet education: Discussed medical nutrition therapy for new colostomy with pt and husband at bedside.  Materials provided on last visit and pt still has them.  Questions addressed, expect good compliance   NUTRITION DIAGNOSIS:   Food and nutrition related knowledge deficit related to acute illness as evidenced by  (new colostomy).    GOAL:   Patient will meet greater than or equal to 90% of their needs    MONITOR:    (Energy intake, Digestive system)  REASON FOR ASSESSMENT:   LOS    ASSESSMENT:       Current Nutrition: eating 25-75% of meals per I and O sheet.  Pt reports eating better and drinking some of mightyshake   Gastrointestinal Profile: Last BM: 12/9 output from colostomy   Scheduled Medications:  . antiseptic oral rinse  7 mL Mouth Rinse BID  . aspirin  81 mg Oral Daily  . bisacodyl  10 mg Rectal Once  . bisacodyl  10 mg Rectal BID  . cyclobenzaprine  10 mg Oral TID  . fluticasone  2 spray Each Nare Daily  . heparin  5,000 Units Subcutaneous 3 times per day  . hydrocerin   Topical BID  . lactulose  30 g Oral Daily  . magnesium oxide  400 mg Oral Daily  . mineral oil   Oral Daily  . nystatin  5 mL Oral QID  . pantoprazole  40 mg Oral QAC supper  . piperacillin-tazobactam (ZOSYN)  IV  3.375 g Intravenous 3 times per day  . polyethylene glycol  17 g Oral BID  . potassium chloride  20 mEq Oral Daily    Continuous Medications:  . dextrose 5 % and 0.45 % NaCl with KCl 20 mEq/L 50 mL/hr at 03/16/15 0759     Electrolyte/Renal Profile and Glucose Profile:   Recent Labs Lab 03/10/15 0703 03/11/15 0635 03/12/15 0736 03/13/15 0539 03/14/15 0408 03/15/15 0412  NA 138 135 138 134* 137 136   K 3.2* 3.0* 3.2* 3.5 4.3 4.4  CL 101 99* 101 100* 104 105  CO2 31 30 32 29 27 24   BUN 8 9 7 9 9 9   CREATININE 0.78  0.78 0.94 0.92 1.17* 1.05* 1.08*  CALCIUM 7.5* 7.3* 7.7* 7.6* 8.1* 8.0*  MG 1.6* 1.6* 1.8  --   --   --   PHOS 3.5 3.0 3.4  --   --   --   GLUCOSE 117* 116* 113* 103* 108* 104*      Weight Trend since Admission: Filed Weights   02/27/15 2338  Weight: 150 lb (68.04 kg)      Diet Order:  Diet regular Room service appropriate?: Yes; Fluid consistency:: Thin  Skin:   reviewed   Height:   Ht Readings from Last 1 Encounters:  02/27/15 5' 4.5" (1.638 m)    Weight:   Wt Readings from Last 1 Encounters:  02/27/15 150 lb (68.04 kg)    Ideal Body Weight:     BMI:  Body mass index is 25.36 kg/(m^2).  Estimated Nutritional Needs:   Kcal:  BEE 1235 kcals (IF 1.0-1.2, AF 1.3) [id number] kcals/d  Protein:  (1.0-1.2 g/kg) 68-82 g/d  Fluid:  (25-39ml/kg) 1700-2060ml/d  EDUCATION NEEDS:   Education needs addressed  MODERATE Care Level  Naisha Wisdom B.  Zenia Resides, [place], [place] (pager)

## 2015-03-20 ENCOUNTER — Emergency Department: Payer: Managed Care, Other (non HMO)

## 2015-03-20 ENCOUNTER — Telehealth: Payer: Self-pay

## 2015-03-20 ENCOUNTER — Emergency Department
Admission: EM | Admit: 2015-03-20 | Discharge: 2015-03-20 | Disposition: A | Discharge status: Home / Self Care | Payer: Managed Care, Other (non HMO) | Attending: Emergency Medicine | Admitting: Emergency Medicine

## 2015-03-20 ENCOUNTER — Encounter: Payer: Self-pay | Admitting: Medical Oncology

## 2015-03-20 DIAGNOSIS — R0602 Shortness of breath: Secondary | ICD-10-CM | POA: Diagnosis present

## 2015-03-20 DIAGNOSIS — R0789 Other chest pain: Secondary | ICD-10-CM | POA: Diagnosis not present

## 2015-03-20 DIAGNOSIS — R0600 Dyspnea, unspecified: Secondary | ICD-10-CM | POA: Diagnosis not present

## 2015-03-20 DIAGNOSIS — Z7982 Long term (current) use of aspirin: Secondary | ICD-10-CM | POA: Diagnosis not present

## 2015-03-20 DIAGNOSIS — R000 Tachycardia, unspecified: Secondary | ICD-10-CM | POA: Diagnosis not present

## 2015-03-20 DIAGNOSIS — R079 Chest pain, unspecified: Secondary | ICD-10-CM

## 2015-03-20 DIAGNOSIS — Z792 Long term (current) use of antibiotics: Secondary | ICD-10-CM | POA: Diagnosis not present

## 2015-03-20 DIAGNOSIS — Z79899 Other long term (current) drug therapy: Secondary | ICD-10-CM | POA: Insufficient documentation

## 2015-03-20 LAB — CBC
HEMATOCRIT: 31.6 % — AB (ref 35.0–47.0)
Hemoglobin: 10.7 g/dL — ABNORMAL LOW (ref 12.0–16.0)
MCH: 31.9 pg (ref 26.0–34.0)
MCHC: 33.7 g/dL (ref 32.0–36.0)
MCV: 94.5 fL (ref 80.0–100.0)
Platelets: 889 10*3/uL — ABNORMAL HIGH (ref 150–440)
RBC: 3.35 MIL/uL — ABNORMAL LOW (ref 3.80–5.20)
RDW: 13.7 % (ref 11.5–14.5)
WBC: 13.7 10*3/uL — ABNORMAL HIGH (ref 3.6–11.0)

## 2015-03-20 LAB — TROPONIN I: Troponin I: <0.03 ng/mL (ref <0.031)

## 2015-03-20 LAB — BASIC METABOLIC PANEL
Anion gap: 10 (ref 5–15)
BUN: 11 mg/dL (ref 6–20)
CHLORIDE: 97 mmol/L — AB (ref 101–111)
CO2: 27 mmol/L (ref 22–32)
CREATININE: 0.93 mg/dL (ref 0.44–1.00)
Calcium: 9.2 mg/dL (ref 8.9–10.3)
GFR calc non Af Amer: >60 mL/min (ref >60)
GLUCOSE: 121 mg/dL — AB (ref 65–99)
Potassium: 4.4 mmol/L (ref 3.5–5.1)
SODIUM: 134 mmol/L — AB (ref 135–145)

## 2015-03-20 MED ORDER — ONDANSETRON HCL 4 MG/2ML IJ SOLN
4.0000 mg | Freq: Once | INTRAMUSCULAR | Status: AC
  Administered 2015-03-20: 4 mg via INTRAVENOUS
  Filled 2015-03-20: qty 2

## 2015-03-20 MED ORDER — SODIUM CHLORIDE 0.9 % IV BOLUS (SEPSIS)
1000.0000 mL | Freq: Once | INTRAVENOUS | Status: AC
  Administered 2015-03-20: 1000 mL via INTRAVENOUS

## 2015-03-20 MED ORDER — NAPROXEN 500 MG PO TABS: 500.0000 mg | ORAL_TABLET | Freq: Two times a day (BID) | ORAL | Status: DC

## 2015-03-20 MED ORDER — IOHEXOL 350 MG/ML SOLN
100.0000 mL | Freq: Once | INTRAVENOUS | Status: AC | PRN
  Administered 2015-03-20: 75 mL via INTRAVENOUS

## 2015-03-20 NOTE — Telephone Encounter (Signed)
Patient had a Exploratory Laparotomy 03/01/2015 by Dr. Azalee Course.  Patient's husband Barbaraann Rondo) called stating that she was discharged from the hospital Friday from having colon resection and colostomy, while pt was in the hospital she had a pleural effusion and had to have chest tube and thoracentesis to rt side. Since being discharged Barbaraann Rondo reports increased difficulty breathing and pain to left side of shoulder. I told Barbaraann Rondo that I would need to speak to our surgeon on call and ask for his opinion. Barbaraann Rondo understood.  Spoke with Dr. Adonis Huguenin and explained what was going on with patient. He stated that since she had been at the hospital with some complications, he recommended for patient to go to the Emergency Room.  I then called and spoke to Barbaraann Rondo and told him that Dr. Adonis Huguenin recommended for him to take his wife to the Emergency Room. Barbaraann Rondo understood and stated that he would take her as soon as we hung up.

## 2015-03-20 NOTE — ED Notes (Signed)
Pt to triage with reports that she was discharged from the hospital Friday from having colon resection and colostomy, while pt was in the hospital she had a pleural effusion and had to have chest tube and thoracentesis to rt side. Since being discharged pt reports increased sob and pain to left side of ribs.

## 2015-03-20 NOTE — Discharge Instructions (Signed)

## 2015-03-20 NOTE — ED Provider Notes (Signed)
[facility] Emergency Department Provider Note  ____________________________________________  Time seen: 9:45 AM  I have reviewed the triage vital signs and the nursing notes.   HISTORY  Chief Complaint Shortness of Breath    HPI Natalie [name] is a 59 y.o. female who complains shortness of breath and left-sided chest pain that is a tightness and achiness and worse with breathing. Pain is constant, waxing and waning. Not exertional. No vomiting diaphoresis dizziness or radiation. No other aggravating or alleviating factors. She has a recent history of partial colectomy due to perforated diverticulitis with colostomy, as well as very recent hospitalization within the past week or 2 for a right-sided pleural effusion requiring thoracentesis and chest tube placement.  Patient has chronic low fluid intake and frequently feels dehydrated   Past Medical History  Diagnosis Date  . Blood in stool 2/20016  . Diverticulitis   . UTI (lower urinary tract infection)   . Chronic sinusitis      Patient Active Problem List   Diagnosis Date Noted  . Dyspnea   . Pleural effusion, right   . S/P thoracentesis   . Shortness of breath   . Perforated bowel ([place])   . Perforated viscus   . Sigmoid diverticulitis   . Diverticulitis 02/28/2015  . Chronic constipation 12/20/2014  . Diverticulosis 12/20/2014     Past Surgical History  Procedure Laterality Date  . Laparotomy N/A 03/01/2015    Procedure: EXPLORATORY LAPAROTOMY, SIGMOID COLECTOMY, COLOSTOMY;  Surgeon: Hubbard Robinson, MD;  Location: [facility] ORS;  Service: General;  Laterality: N/A;  . Colon surgery       Current Outpatient Rx  Name  Route  Sig  Dispense  Refill  . amoxicillin-clavulanate (AUGMENTIN) 875-125 MG tablet   Oral   Take 1 tablet by mouth 2 (two) times daily.   20 tablet   1   . aspirin 81 MG chewable tablet   Oral   Chew 1 tablet (81 mg total) by mouth daily.   30 tablet   1   .  ciprofloxacin (CIPRO) 500 MG tablet   Oral   Take 500 mg by mouth 2 (two) times daily.      0   . docusate calcium (SURFAK) 240 MG capsule   Oral   Take 240 mg by mouth daily as needed for mild constipation.         . fluticasone (FLONASE) 50 MCG/ACT nasal spray   Each Nare   Place 1 spray into both nostrils daily as needed for allergies or rhinitis.          Marland Kitchen lactulose (CHRONULAC) 10 GM/15ML solution   Oral   Take 45 mLs (30 g total) by mouth daily.   240 mL   0   . magnesium oxide (MAG-OX) 400 (241.3 MG) MG tablet   Oral   Take 1 tablet (400 mg total) by mouth daily.   30 tablet   1   . mineral oil liquid   Oral   Take 15 mLs by mouth as needed for moderate constipation. 1 Tbsp PRN         . nystatin (MYCOSTATIN) 100000 UNIT/ML suspension   Oral   Take 5 mLs (500,000 Units total) by mouth 4 (four) times daily.   60 mL   0   . oxyCODONE-acetaminophen (PERCOCET) 7.5-325 MG tablet   Oral   Take 1-2 tablets by mouth every 4 (four) hours as needed for moderate pain or severe pain.  30 tablet   0   . pantoprazole (PROTONIX) 40 MG tablet   Oral   Take 1 tablet (40 mg total) by mouth daily before supper.   30 tablet   1   . polyethylene glycol (MIRALAX / GLYCOLAX) packet   Oral   Take 17 g by mouth 2 (two) times daily.   14 each   0   . potassium chloride SA (K-DUR,KLOR-CON) 20 MEQ tablet   Oral   Take 1 tablet (20 mEq total) by mouth daily.   30 tablet   1   . naproxen (NAPROSYN) 500 MG tablet   Oral   Take 1 tablet (500 mg total) by mouth 2 (two) times daily with a meal.   20 tablet   0      Allergies Codeine and Sulfa antibiotics   Family History  Problem Relation Age of Onset  . Stroke Mother   . Arthritis Father   . Lung cancer Father   . Colon cancer Maternal Aunt   . Breast cancer Paternal Aunt     Social History Social History  Substance Use Topics  . Smoking status: Never Smoker   . Smokeless tobacco: None  . Alcohol  Use: No    Review of Systems  Constitutional:   No fever or chills. No weight changes Eyes:   No blurry vision or double vision.  ENT:   No sore throat. Cardiovascular:   Positive chest pain. Respiratory:   Positive dyspnea without cough. Gastrointestinal:   Negative for abdominal pain, vomiting and diarrhea.  No BRBPR or melena. Genitourinary:   Negative for dysuria, urinary retention, bloody urine, or difficulty urinating. Musculoskeletal:   Negative for back pain. No joint swelling or pain. Skin:   Negative for rash. Neurological:   Negative for headaches, focal weakness or numbness. Psychiatric:  No anxiety or depression.   Endocrine:  No hot/cold intolerance, changes in energy, or sleep difficulty.  10-point ROS otherwise negative.  ____________________________________________   PHYSICAL EXAM:  VITAL SIGNS: ED Triage Vitals  Enc Vitals Group     BP 03/20/15 0937 136/114 mmHg     Pulse Rate 03/20/15 0937 122     Resp 03/20/15 0937 24     Temp 03/20/15 0937 98.1 F (36.7 C)     Temp Source 03/20/15 0937 Oral     SpO2 03/20/15 0937 98 %     Weight 03/20/15 0937 150 lb (68.04 kg)     Height 03/20/15 0937 5\' 4"  (1.626 m)     Head Cir --      Peak Flow --      Pain Score 03/20/15 0938 6     Pain Loc --      Pain Edu? --      Excl. in [place]? --      Constitutional:   Alert and oriented. Well appearing and in no distress. Eyes:   No scleral icterus. No conjunctival pallor. PERRL. EOMI ENT   Head:   Normocephalic and atraumatic.   Nose:   No congestion/rhinnorhea. No septal hematoma   Mouth/Throat:   Dry mucous membranes, no pharyngeal erythema. No peritonsillar mass. No uvula shift.   Neck:   No stridor. No SubQ emphysema. No meningismus. Hematological/Lymphatic/Immunilogical:   No cervical lymphadenopathy. Cardiovascular:   Tachycardia heart rate 110. Normal and symmetric distal pulses are present in all extremities. No murmurs, rubs, or  gallops. Respiratory:   Normal respiratory effort without tachypnea nor retractions. Breath sounds are clear and equal  bilaterally. No wheezes/rales/rhonchi. Gastrointestinal:   Soft and nontender. No distention. There is no CVA tenderness.  No rebound, rigidity, or guarding. Genitourinary:   deferred Musculoskeletal:   Nontender with normal range of motion in all extremities. No joint effusions.  No lower extremity tenderness.  No edema. Chest wall nontender Neurologic:   Normal speech and language.  CN 2-10 normal. Motor grossly intact. No pronator drift.  Normal gait. No gross focal neurologic deficits are appreciated.  Skin:    Skin is warm, dry and intact. No rash noted.  No petechiae, purpura, or bullae. Psychiatric:   Mood and affect are normal. Speech and behavior are normal. Patient exhibits appropriate insight and judgment.  ____________________________________________    LABS (pertinent positives/negatives) (all labs ordered are listed, but only abnormal results are displayed) Labs Reviewed  BASIC METABOLIC PANEL - Abnormal; Notable for the following:    Sodium 134 (*)    Chloride 97 (*)    Glucose, Bld 121 (*)    All other components within normal limits  CBC - Abnormal; Notable for the following:    WBC 13.7 (*)    RBC 3.35 (*)    Hemoglobin 10.7 (*)    HCT 31.6 (*)    Platelets 889 (*)    All other components within normal limits  TROPONIN I   ____________________________________________   EKG  Interpreted by me Sinus tachycardia rate 119, normal axis intervals QRS and ST segments and T waves  ____________________________________________    RADIOLOGY  Chest x-ray interpreted by me, radiology report reviewed, stable small bilateral pleural effusions, high right hemidiaphragm.  CT angiogram chest shows no evidence of PE, no acute pathology.  ____________________________________________   PROCEDURES   ____________________________________________   INITIAL IMPRESSION / ASSESSMENT AND PLAN / ED COURSE  Pertinent labs & imaging results that were available during my care of the patient were reviewed by me and considered in my medical decision making (see chart for details).  Patient presents with chest pain shortness breath tachycardia at a setting of recent hospitalization and recent surgery. Significant risk for PE with the nature of her symptoms. Initial labs and chest x-ray are unrevealing, will proceed with CT evidence of the chest.  ----------------------------------------- 12:26 PM on 03/20/2015 -----------------------------------------  CT unremarkable. Heart rate improved to about 100 after 1 L IV fluids. Likely the tachycardia was fairly simple are due to dehydration. No evidence of PE or aortic injury. CT is actually reassuring that the effusions are less significant than they appear on x-ray. We'll discharge her home, continue NSAIDs and pain medication, gentle range of motion activity, and sinus problems are that she has at home, follow-up with primary care.     ____________________________________________   FINAL CLINICAL IMPRESSION(S) / ED DIAGNOSES  Final diagnoses:  Nonspecific chest pain      Carrie Mew, MD 03/20/15 1227

## 2015-03-20 NOTE — Telephone Encounter (Signed)
Post discharge call to patient made at this time. Patient is currently in Emergency Room for Chest Pain at this time.  Patient scheduled to follow-up on 12/20 with Dr. Adonis Huguenin.

## 2015-03-20 NOTE — ED Notes (Signed)
Patient transported to CT 

## 2015-03-22 ENCOUNTER — Encounter: Payer: Self-pay | Admitting: Primary Care

## 2015-03-23 ENCOUNTER — Telehealth: Payer: Self-pay | Admitting: Surgery

## 2015-03-23 NOTE — Telephone Encounter (Signed)
Returned phone call to patient at this time. Upon her going through what she has eaten in the last week, it is a small amount but she is holding down what she eats and drinks. She is drinking constantly but still feels parched per patient. She is not having regular bowel movements from colostomy.   Was given Mineral Oil, Miralax, and Lactulose at discharge from hospital. Has only used lactulose twice and this helps. She states that when she takes a dose of the lactulose, it feels the bag of the colostomy up twice and is soft stool. She feels bloated and has no appetite. Denies nausea and vomiting and fever. She has also stopped her Augmentin on Tuesday because of her upset stomach. She never started Cipro in the chart as she was told not to begin this by discharging nurse.  Spoke with Dr. Burt Knack at this time. He has ordered for patient to pick up Fleets enemas from pharmacy. Do 2 enemas today through colostomy and then 2 tomorrow as well. After this, she will need to take Miralax 17grams daily. He also would like patient to continue Augmentin that was given until it has been completed.  Returned phone call to patient, explained the orders given. She wrote them down and read them back over the phone. Encouraged to call back if no success with this regimen or if she begins Miralax daily and is still having difficulty with constipation.

## 2015-03-23 NOTE — Telephone Encounter (Signed)
Patient called not feeling well since surgery she had  a EXPLORATORY LAPAROTOMY, SIGMOID COLECTOMY, COLOSTOMY  with [place]  11/24. Very bloated. Not able to eat much or drink. She just isn't feeling right

## 2015-03-26 ENCOUNTER — Telehealth: Payer: Self-pay | Admitting: General Surgery

## 2015-03-26 NOTE — Telephone Encounter (Signed)
Mark (Physical Therapist) called me back and I gave him a verbal order to start physical therapy on patient. I clarified that it would be 2 times a week for 3 weeks. He said yes. I then told him that Dr. Adonis Huguenin agreed. I told him that if he needed anything else to please give [place] a call.

## 2015-03-26 NOTE — Telephone Encounter (Signed)
Mark P.T from [facility] left a voice messge Friday. He would like verbal orders for therpy 2x's week/3 weeks. Dr. Reginal Lutes patient. Please call. [phone]

## 2015-03-26 NOTE — Telephone Encounter (Signed)
Called Mark from [facility] and left him a voicemail to return my call.  I asked Dr. Adonis Huguenin if it was okay for patient to have Physical Therapy and he stated that he agreed.

## 2015-03-26 NOTE — Telephone Encounter (Signed)
Called Natalie [name] again and had to leave a voicemail to return my call.

## 2015-03-27 ENCOUNTER — Encounter: Payer: Self-pay | Admitting: General Surgery

## 2015-03-27 ENCOUNTER — Encounter: Payer: Self-pay | Admitting: Emergency Medicine

## 2015-03-27 ENCOUNTER — Ambulatory Visit (INDEPENDENT_AMBULATORY_CARE_PROVIDER_SITE_OTHER): Payer: Managed Care, Other (non HMO) | Admitting: General Surgery

## 2015-03-27 ENCOUNTER — Observation Stay
Admission: EM | Admit: 2015-03-27 | Discharge: 2015-03-30 | Disposition: A | Discharge status: Home Health | Payer: Managed Care, Other (non HMO) | Attending: Surgery | Admitting: Surgery

## 2015-03-27 ENCOUNTER — Emergency Department: Payer: Managed Care, Other (non HMO)

## 2015-03-27 VITALS — BP 126/67 | HR 109 | Temp 98.1°F | Resp 40 | Ht 64.0 in | Wt 141.0 lb

## 2015-03-27 DIAGNOSIS — J90 Pleural effusion, not elsewhere classified: Secondary | ICD-10-CM | POA: Diagnosis present

## 2015-03-27 DIAGNOSIS — Z800 Family history of malignant neoplasm of digestive organs: Secondary | ICD-10-CM | POA: Insufficient documentation

## 2015-03-27 DIAGNOSIS — Z885 Allergy status to narcotic agent status: Secondary | ICD-10-CM | POA: Diagnosis not present

## 2015-03-27 DIAGNOSIS — R110 Nausea: Principal | ICD-10-CM | POA: Insufficient documentation

## 2015-03-27 DIAGNOSIS — R0602 Shortness of breath: Secondary | ICD-10-CM | POA: Diagnosis not present

## 2015-03-27 DIAGNOSIS — Z7982 Long term (current) use of aspirin: Secondary | ICD-10-CM | POA: Insufficient documentation

## 2015-03-27 DIAGNOSIS — J329 Chronic sinusitis, unspecified: Secondary | ICD-10-CM | POA: Diagnosis not present

## 2015-03-27 DIAGNOSIS — Z9889 Other specified postprocedural states: Secondary | ICD-10-CM | POA: Diagnosis not present

## 2015-03-27 DIAGNOSIS — Z823 Family history of stroke: Secondary | ICD-10-CM | POA: Insufficient documentation

## 2015-03-27 DIAGNOSIS — Z8261 Family history of arthritis: Secondary | ICD-10-CM | POA: Insufficient documentation

## 2015-03-27 DIAGNOSIS — Z933 Colostomy status: Secondary | ICD-10-CM | POA: Insufficient documentation

## 2015-03-27 DIAGNOSIS — R109 Unspecified abdominal pain: Secondary | ICD-10-CM | POA: Diagnosis not present

## 2015-03-27 DIAGNOSIS — Z881 Allergy status to other antibiotic agents status: Secondary | ICD-10-CM | POA: Diagnosis not present

## 2015-03-27 DIAGNOSIS — J9811 Atelectasis: Secondary | ICD-10-CM | POA: Diagnosis not present

## 2015-03-27 DIAGNOSIS — T814XXD Infection following a procedure, subsequent encounter: Secondary | ICD-10-CM

## 2015-03-27 DIAGNOSIS — Z803 Family history of malignant neoplasm of breast: Secondary | ICD-10-CM | POA: Diagnosis not present

## 2015-03-27 DIAGNOSIS — Z882 Allergy status to sulfonamides status: Secondary | ICD-10-CM | POA: Diagnosis not present

## 2015-03-27 DIAGNOSIS — Z4889 Encounter for other specified surgical aftercare: Secondary | ICD-10-CM

## 2015-03-27 DIAGNOSIS — K5909 Other constipation: Secondary | ICD-10-CM | POA: Diagnosis not present

## 2015-03-27 DIAGNOSIS — R0600 Dyspnea, unspecified: Secondary | ICD-10-CM

## 2015-03-27 DIAGNOSIS — K5900 Constipation, unspecified: Secondary | ICD-10-CM | POA: Diagnosis not present

## 2015-03-27 DIAGNOSIS — Z801 Family history of malignant neoplasm of trachea, bronchus and lung: Secondary | ICD-10-CM | POA: Diagnosis not present

## 2015-03-27 DIAGNOSIS — K5732 Diverticulitis of large intestine without perforation or abscess without bleeding: Secondary | ICD-10-CM | POA: Insufficient documentation

## 2015-03-27 DIAGNOSIS — IMO0001 Reserved for inherently not codable concepts without codable children: Secondary | ICD-10-CM

## 2015-03-27 LAB — BASIC METABOLIC PANEL
ANION GAP: 10 (ref 5–15)
BUN: 13 mg/dL (ref 6–20)
CO2: 25 mmol/L (ref 22–32)
Calcium: 9.4 mg/dL (ref 8.9–10.3)
Chloride: 100 mmol/L — ABNORMAL LOW (ref 101–111)
Creatinine, Ser: 0.8 mg/dL (ref 0.44–1.00)
GFR calc Af Amer: >60 mL/min (ref >60)
GLUCOSE: 119 mg/dL — AB (ref 65–99)
POTASSIUM: 4.4 mmol/L (ref 3.5–5.1)
SODIUM: 135 mmol/L (ref 135–145)

## 2015-03-27 LAB — CBC
HCT: 34.5 % — ABNORMAL LOW (ref 35.0–47.0)
HEMOGLOBIN: 11.5 g/dL — AB (ref 12.0–16.0)
MCH: 31.4 pg (ref 26.0–34.0)
MCHC: 33.4 g/dL (ref 32.0–36.0)
MCV: 94.1 fL (ref 80.0–100.0)
Platelets: 635 10*3/uL — ABNORMAL HIGH (ref 150–440)
RBC: 3.67 MIL/uL — AB (ref 3.80–5.20)
RDW: 13.2 % (ref 11.5–14.5)
WBC: 9.1 10*3/uL (ref 3.6–11.0)

## 2015-03-27 LAB — TROPONIN I

## 2015-03-27 MED ORDER — ONDANSETRON HCL 4 MG/2ML IJ SOLN
4.0000 mg | Freq: Once | INTRAMUSCULAR | Status: AC
  Administered 2015-03-27: 4 mg via INTRAVENOUS
  Filled 2015-03-27: qty 2

## 2015-03-27 MED ORDER — ONDANSETRON HCL 4 MG PO TABS: 4.0000 mg | ORAL_TABLET | Freq: Four times a day (QID) | ORAL | Status: DC | PRN

## 2015-03-27 MED ORDER — SODIUM CHLORIDE 0.9 % IV SOLN
Freq: Once | INTRAVENOUS | Status: AC
  Administered 2015-03-27: 13:00:00 via INTRAVENOUS

## 2015-03-27 MED ORDER — IOHEXOL 240 MG/ML SOLN
25.0000 mL | INTRAMUSCULAR | Status: AC
  Administered 2015-03-27: 50 mL via ORAL
  Administered 2015-03-27: 25 mL via ORAL

## 2015-03-27 MED ORDER — PIPERACILLIN-TAZOBACTAM 3.375 G IVPB
3.3750 g | Freq: Once | INTRAVENOUS | Status: AC
  Administered 2015-03-27: 3.375 g via INTRAVENOUS
  Filled 2015-03-27: qty 50

## 2015-03-27 MED ORDER — LACTATED RINGERS IV SOLN
INTRAVENOUS | Status: DC
  Administered 2015-03-27 – 2015-03-30 (×8): via INTRAVENOUS

## 2015-03-27 MED ORDER — HEPARIN SODIUM (PORCINE) 5000 UNIT/ML IJ SOLN
5000.0000 [IU] | Freq: Three times a day (TID) | INTRAMUSCULAR | Status: DC
  Administered 2015-03-27 – 2015-03-30 (×8): 5000 [IU] via SUBCUTANEOUS
  Filled 2015-03-27 (×8): qty 1

## 2015-03-27 MED ORDER — ONDANSETRON HCL 4 MG/2ML IJ SOLN
4.0000 mg | Freq: Once | INTRAMUSCULAR | Status: AC
  Administered 2015-03-27: 4 mg via INTRAVENOUS

## 2015-03-27 MED ORDER — IOHEXOL 300 MG/ML  SOLN
85.0000 mL | Freq: Once | INTRAMUSCULAR | Status: AC | PRN
  Administered 2015-03-27: 85 mL via INTRAVENOUS

## 2015-03-27 MED ORDER — ONDANSETRON HCL 4 MG/2ML IJ SOLN
INTRAMUSCULAR | Status: AC
  Administered 2015-03-27: 4 mg via INTRAVENOUS
  Filled 2015-03-27: qty 2

## 2015-03-27 MED ORDER — POLYETHYLENE GLYCOL 3350 17 GM/SCOOP PO POWD
1.0000 | Freq: Once | ORAL | Status: AC
  Administered 2015-03-27: 255 g via ORAL
  Filled 2015-03-27 (×2): qty 255

## 2015-03-27 MED ORDER — MORPHINE SULFATE (PF) 2 MG/ML IV SOLN: 2.0000 mg | INTRAVENOUS | Status: DC | PRN

## 2015-03-27 MED ORDER — ONDANSETRON HCL 4 MG/2ML IJ SOLN
4.0000 mg | Freq: Four times a day (QID) | INTRAMUSCULAR | Status: DC | PRN
  Administered 2015-03-28: 4 mg via INTRAVENOUS
  Filled 2015-03-27: qty 2

## 2015-03-27 NOTE — ED Notes (Signed)
Pt to ed with c/o sob and difficulty breathing.  Pt reports she was seen at surgeons office this am post colon resection and colostomy placement and was found to have low o2 sats.

## 2015-03-27 NOTE — ED Notes (Signed)
Patient reported nausea. MD notified. Zofran given as ordered.

## 2015-03-27 NOTE — Patient Instructions (Signed)
We are sending you to the ER at this time.

## 2015-03-27 NOTE — ED Provider Notes (Addendum)
[facility] Emergency Department Provider Note     Time seen: ----------------------------------------- 1:01 PM on 03/27/2015 -----------------------------------------    I have reviewed the triage vital signs and the nursing notes.   HISTORY  Chief Complaint Shortness of Breath    HPI Natalie [name] is a 59 y.o. female brought to the ER for difficulty breathing and shortness of breath. Patient was seen at the surgeon's office this morning status post colon resection and colostomy placement. She was found have low oxygen saturations.Reportedly saturations were as low as 86% while ambulating, she denies fevers, chills, chest pain but does have shortness of breath. Recently had a pleural effusion which was drained. She has CT angiogram 7 days ago which did not show a PE.   Past Medical History  Diagnosis Date  . Blood in stool 2/20016  . Diverticulitis   . UTI (lower urinary tract infection)   . Chronic sinusitis   . Colostomy in place [facility])     Patient Active Problem List   Diagnosis Date Noted  . Aftercare following surgery 03/27/2015  . Dyspnea   . Pleural effusion, right   . S/P thoracentesis   . Shortness of breath   . Perforated bowel ([place])   . Perforated viscus   . Sigmoid diverticulitis   . Diverticulitis 02/28/2015  . Chronic constipation 12/20/2014  . Diverticulosis 12/20/2014  . Recurrent cystitis 06/21/2014    Past Surgical History  Procedure Laterality Date  . Laparotomy N/A 03/01/2015    Procedure: EXPLORATORY LAPAROTOMY, SIGMOID COLECTOMY, COLOSTOMY;  Surgeon: Hubbard Robinson, MD;  Location: [facility] ORS;  Service: General;  Laterality: N/A;  . Colon surgery      Allergies Codeine; Levofloxacin; and Sulfa antibiotics  Social History Social History  Substance Use Topics  . Smoking status: Never Smoker   . Smokeless tobacco: Never Used  . Alcohol Use: No    Review of Systems Constitutional: Negative for  fever. Eyes: Negative for visual changes. ENT: Negative for sore throat. Cardiovascular: Negative for chest pain. Respiratory: Positive for shortness of breath Gastrointestinal: Negative for abdominal pain, positive for decreased ostomy output Genitourinary: Negative for dysuria. Musculoskeletal: Negative for back pain. Skin: Negative for rash. Neurological: Negative for headaches, focal weakness or numbness.  10-point ROS otherwise negative.  ____________________________________________   PHYSICAL EXAM:  VITAL SIGNS: ED Triage Vitals  Enc Vitals Group     BP 03/27/15 1138 125/63 mmHg     Pulse Rate 03/27/15 1138 105     Resp 03/27/15 1138 24     Temp 03/27/15 1138 97.8 F (36.6 C)     Temp Source 03/27/15 1138 Oral     SpO2 03/27/15 1138 98 %     Weight 03/27/15 1138 142 lb (64.411 kg)     Height 03/27/15 1138 5\' 4"  (1.626 m)     Head Cir --      Peak Flow --      Pain Score 03/27/15 1139 3     Pain Loc --      Pain Edu? --      Excl. in [place]? --     Constitutional: Alert and oriented. Well appearing and in no distress. Eyes: Conjunctivae are normal. PERRL. Normal extraocular movements. ENT   Head: Normocephalic and atraumatic.   Nose: No congestion/rhinnorhea.   Mouth/Throat: Mucous membranes are moist.   Neck: No stridor. Cardiovascular: Normal rate, regular rhythm. Normal and symmetric distal pulses are present in all extremities. No murmurs, rubs, or gallops. Respiratory:  Mild tachypnea with Breath sounds clear and equal bilaterally. No wheezes/rales/rhonchi. Gastrointestinal: Soft and nontender. No distention. No abdominal bruits. Incision sites look good, nontender abdomen. Musculoskeletal: Nontender with normal range of motion in all extremities. No joint effusions.  No lower extremity tenderness nor edema. Neurologic:  Normal speech and language. No gross focal neurologic deficits are appreciated. Speech is normal. No gait instability. Skin:  Skin  is warm, dry and intact. No rash noted. Psychiatric: Mood and affect are normal. Speech and behavior are normal. Patient exhibits appropriate insight and judgment. ____________________________________________  EKG: Interpreted by me. Sinus tachycardia with a rate of 102 bpm, normal PR interval, normal QRS with, normal QT interval  ____________________________________________  ED COURSE:  Pertinent labs & imaging results that were available during my care of the patient were reviewed by me and considered in my medical decision making (see chart for details). Patient is in no acute distress however does appear dyspneic as before. We'll check basic labs and chest x-ray. ____________________________________________    LABS (pertinent positives/negatives)  Labs Reviewed  BASIC METABOLIC PANEL - Abnormal; Notable for the following:    Chloride 100 (*)    Glucose, Bld 119 (*)    All other components within normal limits  CBC - Abnormal; Notable for the following:    RBC 3.67 (*)    Hemoglobin 11.5 (*)    HCT 34.5 (*)    Platelets 635 (*)    All other components within normal limits  TROPONIN I    RADIOLOGY  Chest x-ray IMPRESSION: No change in small bilateral pleural effusions and basilar Atelectasis.  IMPRESSION: 1. There is trace right pleural effusion right base posterior atelectasis or infiltrate. Small left pleural effusion with left base posterior atelectasis. There is trace anti perihepatic ascites. 2. At least 2 perihepatic/sub- diaphragmatic and 1 posterior subhepatic collections are noted. There is mild peritoneal enhancement. Findings are suspicious for loculated ascites or abscesses. 3. There is small elongated fluid collection deep along the midline lower abdominal wall scar between rectus muscles measures 4 cm cranial caudally by 1.3 cm AP diameter. Small abscess along the scar cannot be excluded. Clinical correlation is necessary. 4. Stable postsurgical  changes post sigmoid colon resection. A colostomy is noted in left lower quadrant. There is no definite evidence of distal colonic obstruction. No small bowel obstruction. 5. No hydronephrosis or hydroureter  ____________________________________________  FINAL ASSESSMENT AND PLAN  Dyspnea, postoperative fluid collections, pleural effusions  Plan: Patient with labs and imaging as dictated above. Patient is no acute distress, will discuss with general surgery on-call. It is uncertain if these are abscesses or not. She is not febrile and does not have leukocytosis. Cases been discussed with a nurse working with Dr. Felton Clinton who will come evaluate in the ER.  Earleen Newport, MD   Earleen Newport, MD 03/27/15 1529  Earleen Newport, MD 03/27/15 [phone]

## 2015-03-27 NOTE — Progress Notes (Signed)
Outpatient Surgical Follow Up  03/27/2015  Natalie [name] is an 59 y.o. female.   Chief Complaint  Patient presents with  . Diverticulitis    HPI: 59 year old female returns to clinic 1 month status post export her laparotomy with resection of perforated sigmoid diverticulitis and creation of end colostomy. She has been home for the last week and a half with intermittent abdominal pain, shortness of breath, dyspnea, nausea, decreased ostomy output. She's had progressive worsening of her shortness of breath and dyspnea on exertion. She's had a difficult time staying hydrated due to nausea. She has been unable to sleep due to discomfort when she lays back and increased difficulty breathing. She has been unable to take the prescribed oral antibiotics secondary to nausea and has had increasing malaise and subjective fevers. Patient had an emergency room workup of dyspnea 1 week ago for potential pulmonary embolism which was negative. She called clinic last week due to decreased ostomy output and was prescribed laxatives and enemas which yielded minimal results. She is very tired and frustrated but no she does not feel well and does not feel safe to go home.  Past Medical History  Diagnosis Date  . Blood in stool 2/20016  . Diverticulitis   . UTI (lower urinary tract infection)   . Chronic sinusitis     Past Surgical History  Procedure Laterality Date  . Laparotomy N/A 03/01/2015    Procedure: EXPLORATORY LAPAROTOMY, SIGMOID COLECTOMY, COLOSTOMY;  Surgeon: Hubbard Robinson, MD;  Location: [facility] ORS;  Service: General;  Laterality: N/A;  . Colon surgery      Family History  Problem Relation Age of Onset  . Stroke Mother   . Arthritis Father   . Lung cancer Father   . Colon cancer Maternal Aunt   . Breast cancer Paternal Aunt     Social History:  reports that she has never smoked. She has never used smokeless tobacco. She reports that she does not drink alcohol or use illicit  drugs.  Allergies:  Allergies  Allergen Reactions  . Codeine Nausea Only  . Levofloxacin Other (See Comments)    insomnia  . Sulfa Antibiotics Rash    Medications reviewed.    ROS A multipoint review of systems was completed. All pertinent positives and negatives within the history of present illness and the remainder were negative.   BP 126/67 mmHg  Pulse 109  Temp(Src) 98.1 F (36.7 C) (Oral)  Resp 40  Ht 5\' 4"  (1.626 m)  Wt 63.957 kg (141 lb)  BMI 24.19 kg/m2  SpO2 97%  Physical Exam  Gen.: In obvious discomfort and with increased work of breathing HEENT: Head is normocephalic and a traumatic, hearing is intact to voice, eyes are equal and reactive, mucous membranes appear dry. Neck: No palpable lymphadenopathy in the neck is supple Chest: Coarse breath sounds throughout with use of accessory muscles and intermittent tachypnea Heart: Tachycardic Abdomen: Mildly distended but soft. Some tenderness to deep palpation in the upper abdomen. Ostomy present in the left upper quadrant with pasty stool adhered to the ostomy itself. Once the stool was removed able to digitally examine the ostomy to the level below the fascia. No evidence of obstruction no continued palpable stool. Midline wound well approximated without spreading erythema. Staples removed without difficulty and replaced with Steri-Strips. Single area of granulation tissue at the most dependent portion of the incision treated with silver nitrate. Extremities: Moves all extremities well with 1+ pitting edema but palpable pulses in all extremities.  Neuro: Patient appears down question of being depressed due to prolonged illness.   No results found for this or any previous visit (from the past 48 hour(s)). No results found.  Assessment/Plan:  1. Aftercare following surgery Patient is 1 month status post end colostomy secondary to perforated diverticulitis. Her incision sites are healing well. Staples removed and  replaced with Steri-Strips. Minimal granulation tissue treated with silver nitrate. We'll need continued wound care. She was unable to complete her antibiotics prescribed for her perforated diverticulitis.  2. Shortness of breath Patient with worsening shortness of breath and dyspnea. She had a pleural effusion that was treated during her hospital stay and noted on her recent ER visit. This is getting worse per the patient and my examination. Discussed that I'm afraid that this may be secondary to a abscess either within her chest or below her diaphragm but along her diaphragm limiting her ability to breathe. We will send her to the emergency department for further workup of this as she may need medical admission for further treatment. Patient desaturated on ambulation in the clinic indicating that she either has pulmonary deconditioning or a new need for oxygen.  3. Chronic constipation Patient with known chronic constipation prior to surgery. Has had minimal success with prescribed therapy. She is also been very noncompliant. She will need continued prokinetics and continued oral and ostomy stimulation. This will be a very difficult problem to treat in the setting of an end colostomy.     Clayburn Pert, MD FACS General Surgeon  03/27/2015,11:10 AM

## 2015-03-27 NOTE — ED Notes (Signed)
States she is eating only a small amt of food d/t nausea. Also thinks her colostomy isn't moving well

## 2015-03-27 NOTE — H&P (Signed)
Natalie [name] is an 59 y.o. female.    Chief Complaint: Nausea  HPI: This patient is chief complaint is nausea she had a single emesis last Thursday or Friday but has not had any ostomy output to any appreciable amount in the last week or so. She is postop Hartman's procedure for chronic constipation with perforation and she had chronic constipation after the Hartman's procedure is well this required lactulose enemas in the hospital. She is well known to me. Today she describes poor appetite no taste for food poor oral intake and no ostomy output of any appreciable amount she denies fevers or chills. Of note she was sent home on oral antibiotics she cannot take Cipro and was given Augmentin which she failed to take and has not been on any antibiotics for at least 10 days. A CT scan and obtained in the emergency room suggested small 1 or 2 cm fluid collections but with a normal white blood cell count and no fever and not being on any antibiotics I would not recommend starting antibiotics at this point. This would change showed a white blood cell count become elevated or she have a fever.  Past Medical History  Diagnosis Date  . Blood in stool 2/20016  . Diverticulitis   . UTI (lower urinary tract infection)   . Chronic sinusitis   . Colostomy in place [facility])     Past Surgical History  Procedure Laterality Date  . Laparotomy N/A 03/01/2015    Procedure: EXPLORATORY LAPAROTOMY, SIGMOID COLECTOMY, COLOSTOMY;  Surgeon: Hubbard Robinson, MD;  Location: [facility] ORS;  Service: General;  Laterality: N/A;  . Colon surgery      Family History  Problem Relation Age of Onset  . Stroke Mother   . Arthritis Father   . Lung cancer Father   . Colon cancer Maternal Aunt   . Breast cancer Paternal Aunt    Social History:  reports that she has never smoked. She has never used smokeless tobacco. She reports that she does not drink alcohol or use illicit drugs.  Allergies:  Allergies  Allergen  Reactions  . Codeine Nausea Only  . Levofloxacin Other (See Comments)    insomnia  . Sulfa Antibiotics Rash     (Not in a hospital admission)   Review of Systems  Constitutional: Positive for malaise/fatigue. Negative for fever, chills and diaphoresis.  HENT: Negative.   Eyes: Negative.   Respiratory: Positive for shortness of breath. Negative for cough, hemoptysis, sputum production and wheezing.   Cardiovascular: Negative.   Gastrointestinal: Positive for nausea, abdominal pain and constipation. Negative for heartburn, vomiting, diarrhea, blood in stool and melena.  Genitourinary: Negative.   Musculoskeletal: Negative.   Skin: Negative.   Neurological: Positive for weakness.  Endo/Heme/Allergies: Negative.   Psychiatric/Behavioral: Negative.      Physical Exam:  BP 131/57 mmHg  Pulse 99  Temp(Src) 97.8 F (36.6 C) (Oral)  Resp 16  Ht '5\' 4"'$  (1.626 m)  Wt 142 lb (64.411 kg)  BMI 24.36 kg/m2  SpO2 100%  Physical Exam  Constitutional: She is oriented to person, place, and time and well-developed, well-nourished, and in no distress. No distress.  There is quite comfortable  HENT:  Head: Normocephalic and atraumatic.  Eyes: Pupils are equal, round, and reactive to light. Right eye exhibits no discharge. Left eye exhibits no discharge. No scleral icterus.  Neck: Normal range of motion.  Cardiovascular: Normal rate, regular rhythm and normal heart sounds.   Pulmonary/Chest: Effort normal and  breath sounds normal. No respiratory distress. She has no wheezes. She has no rales.  Abdominal: Soft. She exhibits distension. There is no tenderness. There is no rebound and no guarding.  Ostomy left side, no stool in bag Midline wound healing well  Musculoskeletal: Normal range of motion. She exhibits no edema.  Lymphadenopathy:    She has no cervical adenopathy.  Neurological: She is alert and oriented to person, place, and time.  Skin: Skin is warm and dry. She is not  diaphoretic. No erythema.  Psychiatric: Affect normal.        Results for orders placed or performed during the hospital encounter of 03/27/15 (from the past 48 hour(s))  Basic metabolic panel     Status: Abnormal   Collection Time: 03/27/15 11:42 AM  Result Value Ref Range   Sodium 135 135 - 145 mmol/L   Potassium 4.4 3.5 - 5.1 mmol/L   Chloride 100 (L) 101 - 111 mmol/L   CO2 25 22 - 32 mmol/L   Glucose, Bld 119 (H) 65 - 99 mg/dL   BUN 13 6 - 20 mg/dL   Creatinine, Ser 0.80 0.44 - 1.00 mg/dL   Calcium 9.4 8.9 - 10.3 mg/dL   GFR calc non Af Amer >60 >60 mL/min   GFR calc Af Amer >60 >60 mL/min    Comment: (NOTE) The eGFR has been calculated using the CKD EPI equation. This calculation has not been validated in all clinical situations. eGFR's persistently <60 mL/min signify possible Chronic Kidney Disease.    Anion gap 10 5 - 15  Troponin I     Status: None   Collection Time: 03/27/15 11:42 AM  Result Value Ref Range   Troponin I <0.03 <0.031 ng/mL    Comment:        NO INDICATION OF MYOCARDIAL INJURY.   CBC     Status: Abnormal   Collection Time: 03/27/15 11:42 AM  Result Value Ref Range   WBC 9.1 3.6 - 11.0 K/uL   RBC 3.67 (L) 3.80 - 5.20 MIL/uL   Hemoglobin 11.5 (L) 12.0 - 16.0 g/dL   HCT 34.5 (L) 35.0 - 47.0 %   MCV 94.1 80.0 - 100.0 fL   MCH 31.4 26.0 - 34.0 pg   MCHC 33.4 32.0 - 36.0 g/dL   RDW 13.2 11.5 - 14.5 %   Platelets 635 (H) 150 - 440 K/uL   Dg Chest 2 View  03/27/2015  CLINICAL DATA:  Shortness of breath, dyspnea and nausea for the past week and a half with intermittent abdominal pain. Subsequent encounter. EXAM: CHEST  2 VIEW COMPARISON:  CT chest and PA and lateral chest 03/20/2015. FINDINGS: Elevation of the right hemidiaphragm relative to the left is unchanged. Small bilateral pleural effusions and basilar atelectasis persist. No pneumothorax is identified. Heart size is normal. IMPRESSION: No change in small bilateral pleural effusions and  basilar atelectasis. Electronically Signed   By: Inge Rise M.D.   On: 03/27/2015 12:23   Ct Abdomen Pelvis W Contrast  03/27/2015  CLINICAL DATA:  One month post laparotomy and resection of perforated sigmoid diverticulitis with colostomy, intermittent abdominal pain and shortness of Breath, decreased ostomy output EXAM: CT ABDOMEN AND PELVIS WITH CONTRAST TECHNIQUE: Multidetector CT imaging of the abdomen and pelvis was performed using the standard protocol following bolus administration of intravenous contrast. CONTRAST:  83m OMNIPAQUE IOHEXOL 300 MG/ML  SOLN COMPARISON:  03/06/2015 FINDINGS: There is a small left pleural effusion. Tiny right pleural effusion. Streaky atelectasis or  infiltrate right base posteriorly. Sagittal images of the spine shows mild degenerative changes lumbar spine. There is Schmorl's node deformity upper endplate of L3 vertebral body. Trace anterior perihepatic ascites. There is a small lentiform collection right perihepatic subdiaphragmatic axial image 23 with mild wall enhancement measures 3.2 by 1.3 cm suspicious for abscess. Second loculated perihepatic collection axial image 27 measures 1.9 cm. There is a loculated collection just anterior to upper pole of the right kidney close proximity with liver capsule measures 3.8 cm. These are suspicious for loculated ascites or abscesses. Tiny cyst in left hepatic lobe is stable. The pancreas, spleen and adrenal glands are unremarkable. Kidneys are symmetrical in size and enhancement. A cyst in mid to upper pole of the left kidney anteriorly measures 9 mm stable in size in appearance from prior exam. There is no small bowel obstruction. Again noted status post sigmoid resection and colostomy in left lower quadrant. Some colonic stool noted in distal transverse colon. The descending colon is empty partially collapsed. No oral contrast extravasation is noted. There is small amount of fluid deep midline anterior wall along the  midline scar just above the fascia between the rectus muscles measures about 4 cm cranial caudally by 1.4 cm AP diameter. Although this may be postsurgical in nature. Small abscess along the scar cannot be excluded. Clinical correlation is necessary. No pelvic abscess is noted.  There is no small bowel obstruction. There is moderate distended gallbladder without evidence of calcified gallstones. No aortic aneurysm. IMPRESSION: 1. There is trace right pleural effusion right base posterior atelectasis or infiltrate. Small left pleural effusion with left base posterior atelectasis. There is trace anti perihepatic ascites. 2. At least 2 perihepatic/sub- diaphragmatic and 1 posterior subhepatic collections are noted. There is mild peritoneal enhancement. Findings are suspicious for loculated ascites or abscesses. 3. There is small elongated fluid collection deep along the midline lower abdominal wall scar between rectus muscles measures 4 cm cranial caudally by 1.3 cm AP diameter. Small abscess along the scar cannot be excluded. Clinical correlation is necessary. 4. Stable postsurgical changes post sigmoid colon resection. A colostomy is noted in left lower quadrant. There is no definite evidence of distal colonic obstruction. No small bowel obstruction. 5. No hydronephrosis or hydroureter Electronically Signed   By: Lahoma Crocker M.D.   On: 03/27/2015 15:23     Assessment/Plan  CT scan personally reviewed. I suspect that these small fluid collections are not abscess that she has normal white blood cell count and is afebrile. He has been off antibiotics for at least 7-10 days. As she did not take them at home. As prescribed. Therefore I will not restart antibiotics at this point unless something changes. I believe that she is having poor ostomy output because of the chronic constipation that caused her problem in the first place. I will bring her in the hospital hydrate and give or MiraLAX to try to empty out her  colon. She will require observation only.  Florene Glen, MD, FACS

## 2015-03-28 ENCOUNTER — Observation Stay: Payer: Managed Care, Other (non HMO)

## 2015-03-28 LAB — BASIC METABOLIC PANEL
ANION GAP: 7 (ref 5–15)
BUN: 8 mg/dL (ref 6–20)
CALCIUM: 8.6 mg/dL — AB (ref 8.9–10.3)
CO2: 24 mmol/L (ref 22–32)
CREATININE: 0.66 mg/dL (ref 0.44–1.00)
Chloride: 106 mmol/L (ref 101–111)
Glucose, Bld: 110 mg/dL — ABNORMAL HIGH (ref 65–99)
Potassium: 3.5 mmol/L (ref 3.5–5.1)
SODIUM: 137 mmol/L (ref 135–145)

## 2015-03-28 LAB — CBC
HCT: 29.8 % — ABNORMAL LOW (ref 35.0–47.0)
HEMOGLOBIN: 10.1 g/dL — AB (ref 12.0–16.0)
MCH: 32.1 pg (ref 26.0–34.0)
MCHC: 33.9 g/dL (ref 32.0–36.0)
MCV: 94.7 fL (ref 80.0–100.0)
PLATELETS: 494 10*3/uL — AB (ref 150–440)
RBC: 3.14 MIL/uL — AB (ref 3.80–5.20)
RDW: 13.2 % (ref 11.5–14.5)
WBC: 8.1 10*3/uL (ref 3.6–11.0)

## 2015-03-28 MED ORDER — METOCLOPRAMIDE HCL 5 MG/ML IJ SOLN
5.0000 mg | Freq: Four times a day (QID) | INTRAMUSCULAR | Status: DC
  Administered 2015-03-28 – 2015-03-29 (×3): 5 mg via INTRAVENOUS
  Filled 2015-03-28 (×3): qty 2

## 2015-03-28 NOTE — Progress Notes (Signed)
Surgery  Patient is feeling better. She denies any shortness of breath. She is ambulatory in the hallways.  She is tolerating a bowel prep ordered last night.  Filed Vitals:   03/27/15 1930 03/27/15 2018 03/27/15 2102 03/28/15 0445  BP: 131/57 130/63 125/62 120/64  Pulse: 99 93 95 101  Temp:   98 F (36.7 C) 98.2 F (36.8 C)  TempSrc:   Oral Oral  Resp: 16 19 18 16   Height:      Weight:      SpO2: 100% 97% 97% 95%    On physical examination her ostomy is functional. There is some liquid stool within the bag. She reports having 300 cc out. Her midline wound is intact. There is some serous drainage from the midline lower portion.  Impression constipation.  Plan continue GoLYTELY bowel prep. Advance diet as tolerated around this. She will need an outpatient bowel regiment at discharge. Dry dressing to wound.

## 2015-03-28 NOTE — Progress Notes (Signed)
Initial Nutrition Assessment  DOCUMENTATION CODES:   Severe malnutrition in context of acute illness/injury  INTERVENTION:  Meals and snacks: Cater to pt preferences Medical Nutrition Supplement Therapy: recommend once diet progressed ensure enlive/mighyshake/carnation instant breakfast TID between meals to provide additional kcals and protein Nutrition diet education: Discussed small frequent meals, high calorie, protein options as well as nutrition ideas for new colostomy.  Pt still has packet from last admission. Pt verbalized understanding and expect good compliance   NUTRITION DIAGNOSIS:   Inadequate oral intake related to altered GI function as evidenced by per patient/family report.    GOAL:   Patient will meet greater than or equal to 90% of their needs    MONITOR:    (Energy intake, Digestive system, )  REASON FOR ASSESSMENT:   Malnutrition Screening Tool    ASSESSMENT:      Pt admitted with constipation, new colostomy recently  Past Medical History  Diagnosis Date  . Blood in stool 2/20016  . Diverticulitis   . UTI (lower urinary tract infection)   . Chronic sinusitis   . Colostomy in place [facility])     Current Nutrition: drinking gatorade and taking miralax  Food/Nutrition-Related History:poor po intake since surgery (2 weeks). This RD familiar with pt recent hospitalization.  Limited intake during admission and since being at home, not meeting nutritional needs   Scheduled Medications:  . heparin  5,000 Units Subcutaneous 3 times per day    Continuous Medications:  . lactated ringers 150 mL/hr at 03/28/15 1020     Electrolyte/Renal Profile and Glucose Profile:   Recent Labs Lab 03/27/15 1142 03/28/15 0413  NA 135 137  K 4.4 3.5  CL 100* 106  CO2 25 24  BUN 13 8  CREATININE 0.80 0.66  CALCIUM 9.4 8.6*  GLUCOSE 119* 110*    Gastrointestinal Profile: Last BM: 12/21    Nutrition-Focused Physical Exam Findings:  Unable to complete  Nutrition-Focused physical exam at this time.     Weight Change: 8% wt loss in 3 months    Diet Order:  Diet clear liquid Room service appropriate?: Yes; Fluid consistency:: Thin  Skin:   reviewed    Height:   Ht Readings from Last 1 Encounters:  03/27/15 5\' 4"  (1.626 m)    Weight:   Wt Readings from Last 1 Encounters:  03/27/15 142 lb (64.411 kg)    Ideal Body Weight:     BMI:  Body mass index is 24.36 kg/(m^2).  Estimated Nutritional Needs:   Kcal:  BEE 1235 kcals (IF 1.0-1.2, AF 1.3) [id number] kcals/d.   Protein:  (1.0-1.2 g/kg) 68-82 g/d  Fluid:  (25-38ml/kg) 1700-2042ml/d  EDUCATION NEEDS:   Education needs addressed  HIGH Care Level  Iracema Lanagan B. Zenia Resides, [place], [place] (pager) Weekend/On-Call pager [phone])

## 2015-03-28 NOTE — Consult Note (Signed)
WOC ostomy follow up Stoma type/location: LLQ Colostomy Education provided:  Patient was discharged 2 weeks ago after rupture from chronic constipation.  Has had sluggish stools since discharge, she reports.  She is concerned about the pasty thick consistency of her stools.  Has had some issues with her [facility] service and the supply company. We discussed interventions that the MD had recommended to treat the constipation and she indicates that she will try and do these things.  Medications, increased fluids, etc.  Reports she has had no appetite and foods do not taste good at this time.  Was given information for a support group that meets monthly and my contact information provided as well.  Due to her supply issue, I will send her home with 5 pouches, 5 barriers and 5 barrier rings.  Enrolled patient in [facility] Start Discharge program: Yes previous admission Will not follow at this time.  Please re-consult if needed.  Domenic Moras RN BSN [place] Pager [phone]

## 2015-03-29 MED ORDER — LACTULOSE 10 GM/15ML PO SOLN
30.0000 g | Freq: Two times a day (BID) | ORAL | Status: DC
  Administered 2015-03-29 (×2): 30 g via ORAL
  Filled 2015-03-29 (×2): qty 60

## 2015-03-29 MED ORDER — METOCLOPRAMIDE HCL 5 MG/ML IJ SOLN
5.0000 mg | Freq: Four times a day (QID) | INTRAMUSCULAR | Status: DC | PRN
  Administered 2015-03-29 – 2015-03-30 (×2): 5 mg via INTRAVENOUS
  Filled 2015-03-29 (×3): qty 2

## 2015-03-29 MED ORDER — POLYETHYLENE GLYCOL 3350 17 G PO PACK
17.0000 g | PACK | Freq: Every day | ORAL | Status: DC
  Administered 2015-03-29: 17 g via ORAL
  Filled 2015-03-29: qty 1

## 2015-03-29 NOTE — Care Management (Signed)
Anticipate discharge with next 24-48 will need resumption of Home Health orders from attending. Open with [facility] is Natalie [name] [phone] NCM please fax orders to [phone] so that hh may be resumed.

## 2015-03-29 NOTE — Progress Notes (Signed)
Surgery  She is feeling better.  She states the intravenous Reglan help with her nausea. She is hungry. She has been up and about.  Filed Vitals:   03/28/15 0445 03/28/15 1453 03/28/15 2108 03/29/15 0534  BP: 120/64 127/64 124/52 133/61  Pulse: 101 97 89 93  Temp: 98.2 F (36.8 C) 98.3 F (36.8 C) 98.1 F (36.7 C) 98.4 F (36.9 C)  TempSrc: Oral Oral Oral Oral  Resp: 16 16 18 17   Height:      Weight:      SpO2: 95% 96% 97% 94%    I/O last 3 completed shifts: In: [id number] [P.O.:420; I.V.:4917] Out: [id number] [Urine:4000; Stool:775] Total I/O In: 435 [P.O.:100; I.V.:335] Out: 400 [Urine:400]   On examination there is stool within the ostomy. Her wound is unchanged.  Impression chronic constipation in the setting of recent expiratory laparotomy and end colostomy. She does have a chronic constipation problem which needs to be addressed perhaps with the assistance of gastroenterology prior to the reversal of the ostomy. She is in agreement. She will also need an outpatient colonoscopy. This can all be set up with our associates, Dr.Wohl.  I will start her back on MiraLAX as well as lactulose. I anticipated discharge in the next 24-48 hours. I will give her the Reglan IV as needed. He may benefit from oral Reglan short-term upon discharge.

## 2015-03-30 MED ORDER — LACTULOSE 10 GM/15ML PO SOLN: 20.0000 g | Freq: Every day | ORAL | Status: DC

## 2015-03-30 MED ORDER — POLYETHYLENE GLYCOL 3350 17 G PO PACK: 17.0000 g | PACK | Freq: Every day | ORAL | Status: DC

## 2015-03-30 MED ORDER — ONDANSETRON HCL 4 MG PO TABS: 4.0000 mg | ORAL_TABLET | Freq: Four times a day (QID) | ORAL | Status: DC | PRN

## 2015-03-30 MED ORDER — METOCLOPRAMIDE HCL 5 MG PO TABS: 5.0000 mg | ORAL_TABLET | Freq: Three times a day (TID) | ORAL | Status: DC | PRN

## 2015-03-30 NOTE — Discharge Summary (Signed)
Physician Discharge Summary  Patient ID: Natalie [name] MRN: [id number] DOB/AGE: 61-Apr-1957 61 y.o.  Admit date: 03/27/2015 Discharge date: 03/30/2015  Admission Diagnoses: Nausea and constipation Perforated diverticulitis status post Hartman's procedure  Discharge Diagnoses:  Same  Discharged Condition: Improving  Hospital Course:  The patient was admitted and placed on a bowel prep. She tolerated this well. Her diet was able to be advanced. She did have some refractory nausea that responded to intravenous Reglan. She had minor wound issues however nothing needed to be done to the wound. We did start functioning. We counseled her regarding attenuation of lactulose and MiraLAX as an outpatient. She'll need follow-up with gastroenterology for evaluation of chronic constipation prior to ostomy reversal. The patient was in agreement. Outpatient follow-ups were made with her surgeon and GI medicine.  Significant Diagnostic Studies: CAT scan abdomen and pelvis  Discharge Exam: Blood pressure 126/65, pulse 85, temperature 97.8 F (36.6 C), temperature source Oral, resp. rate 17, height 5\' 4"  (1.626 m), weight 142 lb (64.411 kg), SpO2 96 %. Abdomen was soft. Wound is healing nicely. There are several small open areas which were covered with gauze area and ostomy was functioning.  Disposition: 01-Home or Self Care     Medication List    ASK your doctor about these medications        amoxicillin-clavulanate 875-125 MG tablet  Commonly known as:  AUGMENTIN  Take 1 tablet by mouth 2 (two) times daily.     aspirin 81 MG chewable tablet  Chew 1 tablet (81 mg total) by mouth daily.     fluticasone 50 MCG/ACT nasal spray  Commonly known as:  FLONASE  Place 1 spray into both nostrils daily as needed for allergies or rhinitis. Reported on 03/27/2015     lactulose 10 GM/15ML solution  Commonly known as:  CHRONULAC  Take 45 mLs (30 g total) by mouth daily.     magnesium oxide 400  (241.3 MG) MG tablet  Commonly known as:  MAG-OX  Take 1 tablet (400 mg total) by mouth daily.     oxyCODONE-acetaminophen 7.5-325 MG tablet  Commonly known as:  PERCOCET  Take 1-2 tablets by mouth 4 (four) times daily as needed.     pantoprazole 40 MG tablet  Commonly known as:  PROTONIX  Take 1 tablet (40 mg total) by mouth daily before supper.     polyethylene glycol packet  Commonly known as:  MIRALAX / GLYCOLAX  Take 17 g by mouth 2 (two) times daily.     potassium chloride SA 20 MEQ tablet  Commonly known as:  K-DUR,KLOR-CON  Take 1 tablet (20 mEq total) by mouth daily.           Follow-up Information    Follow up with Hubbard Robinson, MD In 1 week.   Specialty:  Surgery   Why:  For wound re-check   Contact information:   [address] [place] [address] [phone]       Signed: Sherri Rad 03/30/2015, 1:13 PM

## 2015-03-30 NOTE — Progress Notes (Signed)
Patient ID: Natalie [name], female   DOB: 12-06-1955, 59 y.o.   MRN: [id number]   Feels better Mild nausea but no emesis Multiple trips to empty ostomy last night reglan is helpful.  Exam unchanged Stable and improved for discharge  Plan daily miralax and once daily lactulose,  Needs follow-up with [facility] and GI medicine.

## 2015-03-30 NOTE — Care Management Note (Signed)
Case Management Note  Patient Details  Name: Natalie [name] MRN: [id number] Date of Birth: 29-Mar-1956  Subjective/Objective:       Referral to resume home health orders faxed to [facility].              Action/Plan:   Expected Discharge Date:                  Expected Discharge Plan:     In-House Referral:     Discharge planning Services     Post Acute Care Choice:    Choice offered to:     DME Arranged:    DME Agency:     HH Arranged:    [place] Agency:     Status of Service:     Medicare Important Message Given:    Date Medicare IM Given:    Medicare IM give by:    Date Additional Medicare IM Given:    Additional Medicare Important Message give by:     If discussed at [place] of Stay Meetings, dates discussed:    Additional Comments:  Carley Strickling A, RN 03/30/2015, 2:05 PM

## 2015-04-02 NOTE — Discharge Summary (Signed)
Physician Discharge Summary  Patient ID: Natalie [name] MRN: [id number] DOB/AGE: 1955/07/18 59 y.o.  Admit date: 02/27/2015 Discharge date: 03/16/2015  Admission Diagnoses:Perforated Diverticulitis; Chronic Constipation, Slow Colonic transit  Discharge Diagnoses:  Active Problems:   Diverticulitis   Perforated viscus   Sigmoid diverticulitis   Perforated bowel (HCC)   Dyspnea   Pleural effusion, right   S/P thoracentesis   Shortness of breath   Discharged Condition: good  Hospital Course: 59 yr old female with perforated diverticulitis was taken to OR for Ex Lap and Hartman's procedure.  She had frank peritonitis and feculent drainage in abdomen, JP drains were left at time of operation.  Her hospital course was complicated by recurrent right pleural effusions which required chest tube drainage.  She slowly improved and began working with PT to increase ambulation with walker.  She was weaned off of O2 and chest tubes removed.  She was tolerating a regular diet and after lactulose, miralax and mineral oil PO regimen plus lactulose enema via ostomy on POD#14 she began having ostomy function.     Consults: pulmonary/intensive care  Treatments: antibiotics: Zosyn and surgery: Ex-Lap, Hartman's  Discharge Exam: Blood pressure 111/61, pulse 99, temperature 98.2 F (36.8 C), temperature source Oral, resp. rate 16, height 5' 4.5" (1.638 m), weight 150 lb (68.04 kg), SpO2 95 %. General appearance: alert, cooperative and no distress GI: soft appropriately tender, mildine incision clean and dry, erytehma resolved, ostomy pink and patent with air and stool in bag Extremities: 1+ pitting edema, 2+ pulses.   Disposition: 06-Home-Health Care Svc     Medication List    STOP taking these medications        amoxicillin-clavulanate 875-125 MG tablet  Commonly known as:  AUGMENTIN     docusate calcium 240 MG capsule  Commonly known as:  SURFAK     mineral oil liquid      TAKE  these medications        aspirin 81 MG chewable tablet  Chew 1 tablet (81 mg total) by mouth daily.     fluticasone 50 MCG/ACT nasal spray  Commonly known as:  FLONASE  Place 1 spray into both nostrils daily as needed for allergies or rhinitis. Reported on 03/27/2015     polyethylene glycol packet  Commonly known as:  MIRALAX / GLYCOLAX  Take 17 g by mouth 2 (two) times daily.           Follow-up Information    Follow up with Clayburn Pert, MD. Go on 03/27/2015.   Specialty:  General Surgery   Why:  Tuesday at 10:00am for wound re-check   Contact information:   [address] [place] [address] [phone]       Signed: Hubbard Robinson 04/02/2015, 5:22 PM

## 2015-04-11 ENCOUNTER — Encounter: Payer: Self-pay | Admitting: Surgery

## 2015-04-11 ENCOUNTER — Ambulatory Visit (INDEPENDENT_AMBULATORY_CARE_PROVIDER_SITE_OTHER): Payer: Managed Care, Other (non HMO) | Admitting: Surgery

## 2015-04-11 VITALS — BP 114/74 | HR 87 | Temp 97.8°F | Ht 64.0 in | Wt 138.0 lb

## 2015-04-11 DIAGNOSIS — K5900 Constipation, unspecified: Secondary | ICD-10-CM

## 2015-04-11 DIAGNOSIS — K5909 Other constipation: Secondary | ICD-10-CM

## 2015-04-11 DIAGNOSIS — K631 Perforation of intestine (nontraumatic): Secondary | ICD-10-CM

## 2015-04-11 DIAGNOSIS — K5732 Diverticulitis of large intestine without perforation or abscess without bleeding: Secondary | ICD-10-CM

## 2015-04-11 NOTE — Patient Instructions (Addendum)
Please drink more water daily to help you with your constipation.  Take Miralax everyday to help you stay regulated. Remember to keep yourself active. You will need to walk more to help you with your constipation.

## 2015-04-11 NOTE — Progress Notes (Signed)
60 yr old female s/p Ex Lap with Hartman's procedure for perforated diverticulitis.  Her hospital stay was complicated by pleural effusions.  She states occasionally still having some SOB and exhaustion.  She has been on Abx since Monday for UTI.  She states still some nausea and having formed stools from ostomy area.    Filed Vitals:   04/11/15 1111  BP: 114/74  Pulse: 87  Temp: 97.8 F (36.6 C)   PE:  GEn: NAD Res: CTAB/L  Cardio: RRR Abd: soft, appropriately tender, incision site healing well, middle with a stitch showing, and in inferior portion with some hypergranulation tissue,  Ostomy pink and patent  A/P:  Chronic constipation:  Continues to be an issue, stool too formed for ostomy, increase water intake and take lactulose throughout this week along with the miralax to keep soft, mushy stools, should improve her nausea as well  Pleural Effusion: last Cxr showed improvement, she sounds good today and SOB improving, encouraged to continue IS at home.   Will have her return in 3 weeks for wound check and to see how constipation improving.

## 2015-04-13 ENCOUNTER — Ambulatory Visit: Payer: Managed Care, Other (non HMO) | Admitting: Surgery

## 2015-04-24 ENCOUNTER — Telehealth: Payer: Self-pay | Admitting: Surgery

## 2015-04-24 NOTE — Telephone Encounter (Signed)
Patient called and would like to talk to a nurse regarding her colostomy. Colostomy is thickened the bowel movement in it and drinking a lot of water and eating better. Question regarding Linzess. If she doesn't answer please call her cell [phone]. She may have to pick up her grand daughter at school.

## 2015-04-24 NOTE — Telephone Encounter (Signed)
Patient called in due to decrease output from Colostomy and increased gas pains. She is drinking 8-9 glasses of water as Dr. Azalee Course has been suggesting and she is still having trouble with colostomy output. She is confused about what medications she should be taking and how much. She states that she is currently taking Miralax 17g once daily only when she has increased abdominal discomfort and bloating.   I informed her that she should be taking her Miralax 17g, twice daily, then if she continues to have problems then we will try the [place] as Dr. Azalee Course had spoken with her about at her appointment. I asked patient to call back in approximately 2 weeks to give me an update on how she is doing with this combination of medications to see what if anything we need to do to help with bowel movements. She is very thankful for this information and will call with an update in 2 weeks.

## 2015-05-01 ENCOUNTER — Other Ambulatory Visit: Payer: Self-pay | Admitting: Surgery

## 2015-05-01 MED ORDER — POLYETHYLENE GLYCOL 3350 17 G PO PACK: 17.0000 g | PACK | Freq: Two times a day (BID) | ORAL | Status: DC

## 2015-05-01 NOTE — Telephone Encounter (Signed)
Patient would like to know if she can have a prescription for Miralax called in to her pharmacy. She states it is cheaper for her to get prescription Miralax. Please call and advise. Thanks.

## 2015-05-01 NOTE — Telephone Encounter (Signed)
Returned phone call to patient at this time. She states that increasing Miralax to twice daily has been very helpful and this seems to be the fix for her constipation. Verified pharmacy. Explained that this would be sent in right away. Encouraged to call with any further questions. Overall, states that she is doing well.

## 2015-05-16 ENCOUNTER — Encounter: Payer: Self-pay | Admitting: Surgery

## 2015-05-16 ENCOUNTER — Ambulatory Visit (INDEPENDENT_AMBULATORY_CARE_PROVIDER_SITE_OTHER): Payer: Managed Care, Other (non HMO) | Admitting: Surgery

## 2015-05-16 VITALS — BP 113/73 | HR 79 | Temp 97.9°F | Wt 139.0 lb

## 2015-05-16 DIAGNOSIS — K5909 Other constipation: Secondary | ICD-10-CM

## 2015-05-16 DIAGNOSIS — R69 Illness, unspecified: Secondary | ICD-10-CM

## 2015-05-16 DIAGNOSIS — K5900 Constipation, unspecified: Secondary | ICD-10-CM | POA: Diagnosis not present

## 2015-05-16 DIAGNOSIS — K5732 Diverticulitis of large intestine without perforation or abscess without bleeding: Secondary | ICD-10-CM | POA: Diagnosis not present

## 2015-05-16 DIAGNOSIS — R198 Other specified symptoms and signs involving the digestive system and abdomen: Secondary | ICD-10-CM

## 2015-05-16 NOTE — Progress Notes (Signed)
Subjective:     Patient ID: Natalie [name], female   DOB: 1955/04/24, 60 y.o.   MRN: [id number]  HPI  60 year old female status post perforated sigmoid diverticulitis with Hartman's procedure at that time. Patient also has a history of significant chronic constipation prior to this as well. Patient states that she still has some fatigue and is at about 75% of normal. She does state that she gets winded a little bit easier whenever on her walks and she did prior to the surgery but states this is getting better. Patient states that she is drinking a lot of water but that she only has one stool through her ostomy per day. She states that it is not solid but it is thicker stools. She is using the MiraLAX twice a day.  Filed Vitals:   05/16/15 0916  BP: 113/73  Pulse: 79  Temp: 97.9 F (36.6 C)     Review of Systems  Constitutional: Positive for activity change and fatigue. Negative for fever, chills, appetite change and unexpected weight change.  HENT: Positive for congestion and rhinorrhea. Negative for facial swelling, postnasal drip and sore throat.   Respiratory: Negative for cough, chest tightness and shortness of breath.   Cardiovascular: Negative for chest pain, palpitations and leg swelling.  Gastrointestinal: Positive for constipation. Negative for nausea, vomiting, abdominal pain, diarrhea and blood in stool.  Genitourinary: Negative for dysuria, urgency, frequency and difficulty urinating.  Musculoskeletal: Negative for back pain.  Skin: Negative for color change, pallor and rash.  Neurological: Positive for dizziness. Negative for weakness.  Hematological: Negative for adenopathy. Does not bruise/bleed easily.  Psychiatric/Behavioral: Negative for agitation. The patient is not nervous/anxious.        Objective:   Physical Exam  Constitutional: She is oriented to person, place, and time. She appears well-developed and well-nourished. No distress.  Cardiovascular: Normal rate,  regular rhythm, normal heart sounds and intact distal pulses.   Pulmonary/Chest: Effort normal and breath sounds normal. No respiratory distress.  Abdominal: Soft. Bowel sounds are normal. She exhibits no distension. There is no tenderness.  Ostomy pink and patent with thick stool in bag   Musculoskeletal: Normal range of motion. She exhibits no edema.  Neurological: She is alert and oriented to person, place, and time. No cranial nerve deficit.  Skin: Skin is warm and dry. No rash noted. No erythema. No pallor.  Psychiatric: She has a normal mood and affect. Her behavior is normal. Judgment and thought content normal.  Vitals reviewed.      Assessment:     60 yr old female 80months s/p Hartman's for perf sigmoid and chronic constipation    Plan:     Discussed with her that she is where I would expect her to be from a fatigue standpoint.  Also discussed with her that she should be able to return to work at least for half days if she didn't feel like she could do Full days.   Chronic Constipation:  Discussed increased water intake, increasing miralax to TID and using lactulose, as well as probiotics to keep soft easy to pass stool about 2-3 times per day.  I will have her f/u with my partner Dr. Allen Norris to help with constipation, she had been on Linzess in the past. She will also need coloscopy through ostomy and per rectum prior to her takedown.   We will have her RTC in about 2-3 months to discuss colostomy takedown at that time.

## 2015-05-21 ENCOUNTER — Other Ambulatory Visit (INDEPENDENT_AMBULATORY_CARE_PROVIDER_SITE_OTHER): Payer: Managed Care, Other (non HMO)

## 2015-05-21 DIAGNOSIS — Z0000 Encounter for general adult medical examination without abnormal findings: Secondary | ICD-10-CM | POA: Diagnosis not present

## 2015-05-21 LAB — COMPREHENSIVE METABOLIC PANEL
ALT: 20 U/L (ref 0–35)
AST: 22 U/L (ref 0–37)
Albumin: 4.3 g/dL (ref 3.5–5.2)
Alkaline Phosphatase: 72 U/L (ref 39–117)
BUN: 14 mg/dL (ref 6–23)
CALCIUM: 10 mg/dL (ref 8.4–10.5)
CO2: 29 meq/L (ref 19–32)
Chloride: 108 mEq/L (ref 96–112)
Creatinine, Ser: 0.76 mg/dL (ref 0.40–1.20)
GFR: 82.19 mL/min (ref >60.00)
GLUCOSE: 96 mg/dL (ref 70–99)
POTASSIUM: 4.3 meq/L (ref 3.5–5.1)
SODIUM: 143 meq/L (ref 135–145)
Total Bilirubin: 0.5 mg/dL (ref 0.2–1.2)
Total Protein: 7.8 g/dL (ref 6.0–8.3)

## 2015-05-21 LAB — CBC
HCT: 38.5 % (ref 36.0–46.0)
Hemoglobin: 12.8 g/dL (ref 12.0–15.0)
MCHC: 33.3 g/dL (ref 30.0–36.0)
MCV: 94.1 fl (ref 78.0–100.0)
Platelets: 307 10*3/uL (ref 150.0–400.0)
RBC: 4.09 Mil/uL (ref 3.87–5.11)
RDW: 13.7 % (ref 11.5–15.5)
WBC: 4.6 10*3/uL (ref 4.0–10.5)

## 2015-05-21 LAB — LIPID PANEL
CHOL/HDL RATIO: 4
Cholesterol: 208 mg/dL — ABNORMAL HIGH (ref 0–200)
HDL: 59.4 mg/dL (ref >39.00)
LDL Cholesterol: 131 mg/dL — ABNORMAL HIGH (ref 0–99)
NONHDL: 148.52
TRIGLYCERIDES: 87 mg/dL (ref 0.0–149.0)
VLDL: 17.4 mg/dL (ref 0.0–40.0)

## 2015-05-21 LAB — VITAMIN D 25 HYDROXY (VIT D DEFICIENCY, FRACTURES): VITD: 33.43 ng/mL (ref 30.00–100.00)

## 2015-05-24 ENCOUNTER — Encounter: Payer: Self-pay | Admitting: Primary Care

## 2015-05-24 ENCOUNTER — Ambulatory Visit (INDEPENDENT_AMBULATORY_CARE_PROVIDER_SITE_OTHER): Payer: Managed Care, Other (non HMO) | Admitting: Primary Care

## 2015-05-24 VITALS — BP 110/72 | HR 71 | Temp 97.5°F | Ht 64.0 in | Wt 138.8 lb

## 2015-05-24 DIAGNOSIS — Z0000 Encounter for general adult medical examination without abnormal findings: Secondary | ICD-10-CM | POA: Insufficient documentation

## 2015-05-24 DIAGNOSIS — K631 Perforation of intestine (nontraumatic): Secondary | ICD-10-CM | POA: Diagnosis not present

## 2015-05-24 DIAGNOSIS — Z1239 Encounter for other screening for malignant neoplasm of breast: Secondary | ICD-10-CM | POA: Diagnosis not present

## 2015-05-24 DIAGNOSIS — Z23 Encounter for immunization: Secondary | ICD-10-CM | POA: Diagnosis not present

## 2015-05-24 HISTORY — DX: Encounter for general adult medical examination without abnormal findings: Z00.00

## 2015-05-24 MED ORDER — ZOSTER VACCINE LIVE 19400 UNT/0.65ML ~~LOC~~ SOLR: 0.6500 mL | Freq: Once | SUBCUTANEOUS | Status: DC

## 2015-05-24 NOTE — Assessment & Plan Note (Signed)
Hospitalization in fall/winter 2016. Now with colostomy bag. Following with GI. Hospital notes reviewed. Stable today. Colostomy looks great.

## 2015-05-24 NOTE — Progress Notes (Signed)
Subjective:    Patient ID: Natalie [name], female    DOB: [birth year], 60 y.o.   MRN: [id number]  HPI  Natalie [name] is a 60 year old female who presents today for complete physical.  Immunizations: -Tetanus: Completed in 2011 -Influenza: Completed in October 2016 -Shingles: Never completed.   Diet: Endorses a fair diet. She recently had a colostomy that was placed in December 2016. She follows with Octavia Heir.  Breakfast: Cereal with milk Lunch: Half sandwich, chips, fruit Dinner: Meat, vegetables Snacks: Yogurt Desserts: Ice cream Beverages: Water  Exercise: She is walking several days weekly and is following with a physical therapist. Eye exam: Completed in Fall 2016 Dental exam: Completes annually Colonoscopy: Completed in 2009, will be going for future colonoscopy soon. Dexa: Never completed, declines  Pap Smear: Completed 3 years ago.  Mammogram: Completed in 2015   Review of Systems  HENT: Negative for rhinorrhea.   Respiratory: Negative for cough and shortness of breath.   Cardiovascular: Negative for chest pain.  Gastrointestinal: Positive for constipation. Negative for nausea, vomiting and abdominal pain.       Current colostomy  Genitourinary: Negative for difficulty urinating.  Skin:       Mild irritation around colostomy without evidence of infection.  Allergic/Immunologic: Positive for environmental allergies.  Neurological: Negative for numbness and headaches.       Mild dizziness  Psychiatric/Behavioral:       Denies concerns for anxiety or depression.       Past Medical History  Diagnosis Date  . Blood in stool 2/20016  . Diverticulitis   . UTI (lower urinary tract infection)   . Chronic sinusitis   . Colostomy in place [facility])     Social History   Social History  . Marital Status: Married    Spouse Name: N/A  . Number of Children: N/A  . Years of Education: N/A   Occupational History  . Not on file.   Social History Main  Topics  . Smoking status: Never Smoker   . Smokeless tobacco: Never Used  . Alcohol Use: No  . Drug Use: No  . Sexual Activity: Not on file   Other Topics Concern  . Not on file   Social History Narrative   Married.   Works for [employer] in [place].   Is a Equities trader.   Enjoys spending time with family, reading, walking. She is raising her granddaughter.       Past Surgical History  Procedure Laterality Date  . Laparotomy N/A 03/01/2015    Procedure: EXPLORATORY LAPAROTOMY, SIGMOID COLECTOMY, COLOSTOMY;  Surgeon: Hubbard Robinson, MD;  Location: [facility] ORS;  Service: General;  Laterality: N/A;  . Colon surgery      Family History  Problem Relation Age of Onset  . Stroke Mother   . Arthritis Father   . Lung cancer Father   . Colon cancer Maternal Aunt   . Breast cancer Paternal Aunt     Allergies  Allergen Reactions  . Codeine Nausea Only  . Levofloxacin Other (See Comments)    insomnia  . Sulfa Antibiotics Rash    Current Outpatient Prescriptions on File Prior to Visit  Medication Sig Dispense Refill  . aspirin 81 MG chewable tablet Chew 1 tablet (81 mg total) by mouth daily. 30 tablet 1  . fluticasone (FLONASE) 50 MCG/ACT nasal spray Place 1 spray into both nostrils daily as needed for allergies or rhinitis. Reported on 03/27/2015    . polyethylene  glycol (MIRALAX / GLYCOLAX) packet Take 17 g by mouth 2 (two) times daily. 60 each 2  . lactulose (CHRONULAC) 10 GM/15ML solution Take 30 mLs (20 g total) by mouth daily. (Patient not taking: Reported on 05/24/2015) 240 mL 5  . metoCLOPramide (REGLAN) 5 MG tablet Take 1 tablet (5 mg total) by mouth every 8 (eight) hours as needed for nausea or refractory nausea / vomiting. (Patient not taking: Reported on 05/24/2015) 20 tablet 1   No current facility-administered medications on file prior to visit.    BP 110/72 mmHg  Pulse 71  Temp(Src) 97.5 F (36.4 C) (Oral)  Ht 5\' 4"  (1.626 m)  Wt 138 lb  12.8 oz (62.959 kg)  BMI 23.81 kg/m2  SpO2 99%    Objective:   Physical Exam  Constitutional: She is oriented to person, place, and time. She appears well-nourished.  HENT:  Right Ear: Tympanic membrane and ear canal normal.  Left Ear: Tympanic membrane and ear canal normal.  Nose: Nose normal.  Mouth/Throat: Oropharynx is clear and moist.  Eyes: Conjunctivae and EOM are normal. Pupils are equal, round, and reactive to light.  Neck: Neck supple. No thyromegaly present.  Cardiovascular: Normal rate and regular rhythm.   No murmur heard. Pulmonary/Chest: Effort normal and breath sounds normal. She has no rales.  Abdominal: Soft. Bowel sounds are normal. There is no tenderness.  Left colostomy bag in place. No obvious signs of breakdown or infection. Stool present.  Musculoskeletal: Normal range of motion.  Lymphadenopathy:    She has no cervical adenopathy.  Neurological: She is alert and oriented to person, place, and time. She has normal reflexes. No cranial nerve deficit.  Skin: Skin is warm and dry. No rash noted.  Psychiatric: She has a normal mood and affect.          Assessment & Plan:

## 2015-05-24 NOTE — Progress Notes (Signed)
Pre visit review using our clinic review tool, if applicable. No additional management support is needed unless otherwise documented below in the visit note. 

## 2015-05-24 NOTE — Patient Instructions (Addendum)
Call your insurance to verify administration of the shingles vaccination.  Call and schedule your mammogram through [facility] as discussed.  Start vitamin D 1000 unit capsules once daily for low vitamin D levels.  Please notify me when you're ready for your bone density testing.  Follow up in 1 year for repeat physical or sooner if needed.  It was a pleasure to see you today!

## 2015-05-24 NOTE — Assessment & Plan Note (Signed)
Tdap and Flu UTD. Due for shingles, RX provided. Due for mammogram, order placed. Due for pap and bone density testing which she would like to defer until next year due to ongoing GI maintenance.  Exam unremarkable. Labs with slight elevation in lipids. Will continue to closely monitor. Discussed the importance of a healthy diet and regular exercise in order for weight loss and to reduce risk of other medical diseases.  Follow up in 1 year for repeat physical.

## 2015-05-31 ENCOUNTER — Ambulatory Visit (INDEPENDENT_AMBULATORY_CARE_PROVIDER_SITE_OTHER): Payer: Managed Care, Other (non HMO) | Admitting: Gastroenterology

## 2015-05-31 ENCOUNTER — Encounter: Payer: Self-pay | Admitting: Gastroenterology

## 2015-05-31 VITALS — BP 119/63 | HR 79 | Temp 98.1°F | Ht 64.0 in | Wt 140.0 lb

## 2015-05-31 DIAGNOSIS — K5901 Slow transit constipation: Secondary | ICD-10-CM | POA: Diagnosis not present

## 2015-05-31 DIAGNOSIS — IMO0002 Reserved for concepts with insufficient information to code with codable children: Secondary | ICD-10-CM

## 2015-05-31 NOTE — Progress Notes (Signed)
Gastroenterology Consultation  Referring Provider:     Pleas Koch, NP Primary Care Physician:  Sheral Flow, NP Primary Gastroenterologist:  Dr. Allen Norris     Reason for Consultation:     Constipation        HPI:   Natalie [name] is a 60 y.o. y/o female referred for consultation & management of constipation by Dr. Sheral Flow, NP.  This patient comes today with a history of diverticulitis with a colon perforation and a resection. The patient was left with a colostomy and a Hartmann pouch. The patient states that she has been having soft stools in large amounts coming out of the ostomy usually once a week and then has very little stool after that. The patient had a motility test with Sitzmarks markers in the past and was told that she had a slow moving colon. She has not had a colonoscopy since 2009. The patient denies any abdominal pain nausea vomiting fevers or chills. She also denies any unexplained weight loss. The patient is in need of a colonoscopy before she has reversal of colostomy. She'll need this test done through the rectum and through the ostomy. She also has problems with cleaning the ostomy due to her consistency of her stools.  Past Medical History  Diagnosis Date  . Blood in stool 2/20016  . Diverticulitis   . UTI (lower urinary tract infection)   . Chronic sinusitis   . Colostomy in place [facility])     Past Surgical History  Procedure Laterality Date  . Laparotomy N/A 03/01/2015    Procedure: EXPLORATORY LAPAROTOMY, SIGMOID COLECTOMY, COLOSTOMY;  Surgeon: Hubbard Robinson, MD;  Location: [facility] ORS;  Service: General;  Laterality: N/A;  . Colon surgery      Prior to Admission medications   Medication Sig Start Date End Date Taking? Authorizing Provider  aspirin 81 MG chewable tablet Chew 1 tablet (81 mg total) by mouth daily. 03/16/15  Yes Florene Glen, MD  cholecalciferol (VITAMIN D) 1000 units tablet Take 1,000 Units by mouth daily.    Yes Historical Provider, MD  fluticasone (FLONASE) 50 MCG/ACT nasal spray Place 1 spray into both nostrils daily as needed for allergies or rhinitis. Reported on 03/27/2015   Yes Historical Provider, MD  polyethylene glycol (MIRALAX / GLYCOLAX) packet Take 17 g by mouth 2 (two) times daily. 05/01/15  Yes Hubbard Robinson, MD  lactulose (CHRONULAC) 10 GM/15ML solution Take 30 mLs (20 g total) by mouth daily. Patient not taking: Reported on 05/24/2015 03/30/15   Sherri Rad, MD  metoCLOPramide (REGLAN) 5 MG tablet Take 1 tablet (5 mg total) by mouth every 8 (eight) hours as needed for nausea or refractory nausea / vomiting. Patient not taking: Reported on 05/24/2015 03/30/15   Sherri Rad, MD  zoster vaccine live, PF, (ZOSTAVAX) 91478 UNT/0.65ML injection Inject 19,400 Units into the skin once. Patient not taking: Reported on 05/31/2015 05/24/15   Pleas Koch, NP    Family History  Problem Relation Age of Onset  . Stroke Mother   . Arthritis Father   . Lung cancer Father   . Colon cancer Maternal Aunt   . Breast cancer Paternal Aunt      Social History  Substance Use Topics  . Smoking status: Never Smoker   . Smokeless tobacco: Never Used  . Alcohol Use: No    Allergies as of 05/31/2015 - Review Complete 05/31/2015  Allergen Reaction Noted  . Codeine Nausea Only 12/20/2014  . Levofloxacin  Other (See Comments) 03/22/2015  . Sulfa antibiotics Rash 12/20/2014    Review of Systems:    All systems reviewed and negative except where noted in HPI.   Physical Exam:  BP 119/63 mmHg  Pulse 79  Temp(Src) 98.1 F (36.7 C) (Oral)  Ht 5\' 4"  (1.626 m)  Wt 140 lb (63.504 kg)  BMI 24.02 kg/m2 No LMP recorded. Patient is postmenopausal. Psych:  Alert and cooperative. Normal mood and affect. General:   Alert,  Well-developed, well-nourished, pleasant and cooperative in NAD Head:  Normocephalic and atraumatic. Eyes:  Sclera clear, no icterus.   Conjunctiva pink. Ears:  Normal auditory  acuity. Nose:  No deformity, discharge, or lesions. Mouth:  No deformity or lesions,oropharynx pink & moist. Neck:  Supple; no masses or thyromegaly. Lungs:  Respirations even and unlabored.  Clear throughout to auscultation.   No wheezes, crackles, or rhonchi. No acute distress. Heart:  Regular rate and rhythm; no murmurs, clicks, rubs, or gallops. Abdomen:  Normal bowel sounds.  No bruits.  Soft, non-tender and non-distended without masses, hepatosplenomegaly or hernias noted.  No guarding or rebound tenderness.  Negative Carnett sign.   Rectal:  Deferred.  Msk:  Symmetrical without gross deformities.  Good, equal movement & strength bilaterally. Pulses:  Normal pulses noted. Extremities:  No clubbing or edema.  No cyanosis. Neurologic:  Alert and oriented x3;  grossly normal neurologically. Skin:  Intact without significant lesions or rashes.  No jaundice. Lymph Nodes:  No significant cervical adenopathy. Psych:  Alert and cooperative. Normal mood and affect.  Imaging Studies: No results found.  Assessment and Plan:   Natalie [name] is a 60 y.o. y/o female who has a history of a sigmoid colectomy due to perforation with a colostomy. The patient will need a colonoscopy through the ostomy and the rectum. This should be done prior to the patient having reversal of her colostomy. The patient also will be started on Linzess 290 mg a day. The patient has been told to do this for 4 days and if she does not have good results then to add in either prune juice once a day or fiber. She will then try that and then step up to adding other things to help her move her bowels including a tablespoon of milk of magnesia. The patient will contact me if she has profuse diarrhea or any other symptoms from our plan. The patient has been explained the plan and agrees with it.   Note: This dictation was prepared with Dragon dictation along with smaller phrase technology. Any transcriptional errors that result  from this process are unintentional.

## 2015-06-01 ENCOUNTER — Other Ambulatory Visit: Payer: Self-pay

## 2015-06-08 ENCOUNTER — Telehealth: Payer: Self-pay | Admitting: Surgery

## 2015-06-08 NOTE — Telephone Encounter (Signed)
Returned phone call to patient at this time. She is uncomfortable feeling right under where Colostomy has been sutured to skin and incisions seem more sensitive. She states that this is worse with motion. Colostomy output is normal and has not decreased at all. She denies nausea, vomiting, and abdominal pain.  I informed patient that nerves when awaking can cause this sensation this far out from surgery. Patient describes this as "weird" not painful. Asked for patient to call back if this becomes worse or more constant.   We will follow-up in early may with patient. She will call in to schedule appointment in Mid-April.

## 2015-06-08 NOTE — Telephone Encounter (Signed)
Patient stated she is having some pain under her colostomy and would like to talk to you about it. No fever.

## 2015-06-20 ENCOUNTER — Telehealth: Payer: Self-pay | Admitting: Surgery

## 2015-06-20 NOTE — Telephone Encounter (Signed)
I put ostomy orders from [facility] to the patients chart that needs a signature, under media

## 2015-06-20 NOTE — Telephone Encounter (Signed)
Filled out and faxed.

## 2015-06-26 ENCOUNTER — Ambulatory Visit: Payer: Self-pay | Admitting: Gastroenterology

## 2015-07-02 ENCOUNTER — Telehealth: Payer: Self-pay | Admitting: Surgery

## 2015-07-02 NOTE — Telephone Encounter (Signed)
Natalie [name], patient would like to speak with you about scheduling her surgery to reverse her colostomy in May. She is wondering if Dr Heath Lark will be in the OR around the May 26th time frame? I did explain to her that she would need an office visit for an updated H&P around the time of surgery.

## 2015-07-03 NOTE — Telephone Encounter (Signed)
I have called patient back and an appointment has been made for her to see Dr Azalee Course on 08/08/15 @ 10:00am in [place] to discuss colostomy reversal.

## 2015-07-12 ENCOUNTER — Telehealth: Payer: Self-pay | Admitting: Gastroenterology

## 2015-07-12 NOTE — Telephone Encounter (Signed)
Patient would like to speak with you, she needs clarification about how to take her Suprep before her colonoscopy. Please call and advise.

## 2015-07-13 NOTE — Telephone Encounter (Signed)
Returned pt's call and answered questions regarding her prep instructions and suprep.

## 2015-07-16 ENCOUNTER — Telehealth: Payer: Self-pay

## 2015-07-16 NOTE — Telephone Encounter (Signed)
Patient called to let us know that there will be a fax arriving from [facility] for new supplies. I assured patient that this would be filled out and faxed back as soon as this has been received in our office. Encouraged patient to call back if anything further is needed prior to her 08/08/15 follow-up appointment with Dr. Azalee Course.

## 2015-07-17 NOTE — Telephone Encounter (Signed)
Signed and faxed back to [employer] at this time. Order to be scanned to chart.

## 2015-07-25 NOTE — Discharge Instructions (Signed)

## 2015-07-27 ENCOUNTER — Ambulatory Visit: Payer: Managed Care, Other (non HMO) | Admitting: Anesthesiology

## 2015-07-27 ENCOUNTER — Ambulatory Visit
Admission: RE | Admit: 2015-07-27 | Discharge: 2015-07-27 | Disposition: A | Discharge status: Home / Self Care | Payer: Managed Care, Other (non HMO) | Source: Ambulatory Visit | Attending: Gastroenterology | Admitting: Gastroenterology

## 2015-07-27 ENCOUNTER — Encounter
Admission: RE | Disposition: A | Discharge status: Home / Self Care | Payer: Self-pay | Source: Ambulatory Visit | Attending: Gastroenterology

## 2015-07-27 DIAGNOSIS — Z7982 Long term (current) use of aspirin: Secondary | ICD-10-CM | POA: Insufficient documentation

## 2015-07-27 DIAGNOSIS — Z7951 Long term (current) use of inhaled steroids: Secondary | ICD-10-CM | POA: Diagnosis not present

## 2015-07-27 DIAGNOSIS — Z803 Family history of malignant neoplasm of breast: Secondary | ICD-10-CM | POA: Diagnosis not present

## 2015-07-27 DIAGNOSIS — K635 Polyp of colon: Secondary | ICD-10-CM | POA: Insufficient documentation

## 2015-07-27 DIAGNOSIS — Z01818 Encounter for other preprocedural examination: Secondary | ICD-10-CM

## 2015-07-27 DIAGNOSIS — Z8261 Family history of arthritis: Secondary | ICD-10-CM | POA: Diagnosis not present

## 2015-07-27 DIAGNOSIS — Z801 Family history of malignant neoplasm of trachea, bronchus and lung: Secondary | ICD-10-CM | POA: Insufficient documentation

## 2015-07-27 DIAGNOSIS — K5900 Constipation, unspecified: Secondary | ICD-10-CM | POA: Diagnosis present

## 2015-07-27 DIAGNOSIS — Z79899 Other long term (current) drug therapy: Secondary | ICD-10-CM | POA: Diagnosis not present

## 2015-07-27 DIAGNOSIS — Z882 Allergy status to sulfonamides status: Secondary | ICD-10-CM | POA: Diagnosis not present

## 2015-07-27 DIAGNOSIS — D125 Benign neoplasm of sigmoid colon: Secondary | ICD-10-CM | POA: Diagnosis not present

## 2015-07-27 DIAGNOSIS — Z885 Allergy status to narcotic agent status: Secondary | ICD-10-CM | POA: Insufficient documentation

## 2015-07-27 DIAGNOSIS — Z823 Family history of stroke: Secondary | ICD-10-CM | POA: Insufficient documentation

## 2015-07-27 DIAGNOSIS — Z800 Family history of malignant neoplasm of digestive organs: Secondary | ICD-10-CM | POA: Insufficient documentation

## 2015-07-27 DIAGNOSIS — Z881 Allergy status to other antibiotic agents status: Secondary | ICD-10-CM | POA: Insufficient documentation

## 2015-07-27 DIAGNOSIS — Z933 Colostomy status: Secondary | ICD-10-CM | POA: Insufficient documentation

## 2015-07-27 HISTORY — PX: COLONOSCOPY WITH PROPOFOL: SHX5780

## 2015-07-27 SURGERY — COLONOSCOPY WITH PROPOFOL
Anesthesia: Monitor Anesthesia Care | Wound class: Contaminated

## 2015-07-27 MED ORDER — LACTATED RINGERS IV SOLN
INTRAVENOUS | Status: DC
  Administered 2015-07-27: 09:00:00 via INTRAVENOUS

## 2015-07-27 MED ORDER — GLYCOPYRROLATE 0.2 MG/ML IJ SOLN
INTRAMUSCULAR | Status: DC | PRN
  Administered 2015-07-27: 0.2 mg via INTRAVENOUS

## 2015-07-27 MED ORDER — SODIUM CHLORIDE 0.9 % IV SOLN: INTRAVENOUS | Status: DC

## 2015-07-27 MED ORDER — PROPOFOL 10 MG/ML IV BOLUS
INTRAVENOUS | Status: DC | PRN
  Administered 2015-07-27: 100 mg via INTRAVENOUS
  Administered 2015-07-27 (×3): 40 mg via INTRAVENOUS
  Administered 2015-07-27: 20 mg via INTRAVENOUS

## 2015-07-27 MED ORDER — LIDOCAINE HCL (CARDIAC) 20 MG/ML IV SOLN
INTRAVENOUS | Status: DC | PRN
  Administered 2015-07-27: 20 mg via INTRAVENOUS

## 2015-07-27 SURGICAL SUPPLY — 22 items
CANISTER SUCT 1200ML W/VALVE (MISCELLANEOUS) ×3 IMPLANT
CLIP HMST 235XBRD CATH ROT (MISCELLANEOUS) IMPLANT
CLIP RESOLUTION 360 11X235 (MISCELLANEOUS)
FCP ESCP3.2XJMB 240X2.8X (MISCELLANEOUS)
FORCEPS BIOP RAD 4 LRG CAP 4 (CUTTING FORCEPS) ×3 IMPLANT
FORCEPS BIOP RJ4 240 W/NDL (MISCELLANEOUS)
FORCEPS ESCP3.2XJMB 240X2.8X (MISCELLANEOUS) IMPLANT
GOWN CVR UNV OPN BCK APRN NK (MISCELLANEOUS) ×2 IMPLANT
GOWN ISOL THUMB LOOP REG UNIV (MISCELLANEOUS) ×4
INJECTOR VARIJECT VIN23 (MISCELLANEOUS) IMPLANT
KIT DEFENDO VALVE AND CONN (KITS) IMPLANT
KIT ENDO PROCEDURE OLY (KITS) ×3 IMPLANT
MARKER SPOT ENDO TATTOO 5ML (MISCELLANEOUS) IMPLANT
PAD GROUND ADULT SPLIT (MISCELLANEOUS) IMPLANT
PROBE APC STR FIRE (PROBE) IMPLANT
SNARE SHORT THROW 13M SML OVAL (MISCELLANEOUS) IMPLANT
SNARE SHORT THROW 30M LRG OVAL (MISCELLANEOUS) IMPLANT
SNARE SNG USE RND 15MM (INSTRUMENTS) IMPLANT
SPOT EX ENDOSCOPIC TATTOO (MISCELLANEOUS)
TRAP ETRAP POLY (MISCELLANEOUS) IMPLANT
VARIJECT INJECTOR VIN23 (MISCELLANEOUS)
WATER STERILE IRR 250ML POUR (IV SOLUTION) ×3 IMPLANT

## 2015-07-27 NOTE — Anesthesia Postprocedure Evaluation (Signed)
Anesthesia Post Note  Patient: Natalie [name]  Procedure(s) Performed: Procedure(s) (LRB): COLONOSCOPY WITH PROPOFOL (N/A)  Patient location during evaluation: PACU Anesthesia Type: MAC Level of consciousness: awake and alert and oriented Pain management: satisfactory to patient Vital Signs Assessment: post-procedure vital signs reviewed and stable Respiratory status: spontaneous breathing, nonlabored ventilation and respiratory function stable Cardiovascular status: blood pressure returned to baseline and stable Postop Assessment: Adequate PO intake and No signs of nausea or vomiting Anesthetic complications: no    Raliegh Ip

## 2015-07-27 NOTE — Anesthesia Procedure Notes (Signed)
Procedure Name: MAC Performed by: Laycee Fitzsimmons Pre-anesthesia Checklist: Patient identified, Emergency Drugs available, Suction available, Patient being monitored and Timeout performed Patient Re-evaluated:Patient Re-evaluated prior to inductionOxygen Delivery Method: Nasal cannula       

## 2015-07-27 NOTE — H&P (Signed)
[facility]  [address] [place], [place] [address] Phone: [phone] Fax : [phone]  Primary Care Physician:  Sheral Flow, NP Primary Gastroenterologist:  Dr. Allen Norris  Pre-Procedure History & Physical: HPI:  Natalie [name] is a 60 y.o. female is here for an colonoscopy.   Past Medical History  Diagnosis Date  . Blood in stool 2/20016  . Diverticulitis   . UTI (lower urinary tract infection)   . Chronic sinusitis   . Colostomy in place [facility])     Past Surgical History  Procedure Laterality Date  . Laparotomy N/A 03/01/2015    Procedure: EXPLORATORY LAPAROTOMY, SIGMOID COLECTOMY, COLOSTOMY;  Surgeon: Hubbard Robinson, MD;  Location: [facility] ORS;  Service: General;  Laterality: N/A;  . Colon surgery      Prior to Admission medications   Medication Sig Start Date End Date Taking? Authorizing Provider  cholecalciferol (VITAMIN D) 1000 units tablet Take 1,000 Units by mouth daily.   Yes Historical Provider, MD  fluticasone (FLONASE) 50 MCG/ACT nasal spray Place 1 spray into both nostrils daily as needed for allergies or rhinitis. Reported on 03/27/2015   Yes Historical Provider, MD  aspirin 81 MG chewable tablet Chew 1 tablet (81 mg total) by mouth daily. Patient not taking: Reported on 07/19/2015 03/16/15   Florene Glen, MD  lactulose (CHRONULAC) 10 GM/15ML solution Take 30 mLs (20 g total) by mouth daily. Patient not taking: Reported on 05/24/2015 03/30/15   Sherri Rad, MD  metoCLOPramide (REGLAN) 5 MG tablet Take 1 tablet (5 mg total) by mouth every 8 (eight) hours as needed for nausea or refractory nausea / vomiting. Patient not taking: Reported on 05/24/2015 03/30/15   Sherri Rad, MD  polyethylene glycol [facility] Floria Raveling) packet Take 17 g by mouth 2 (two) times daily. 05/01/15   Hubbard Robinson, MD  zoster vaccine live, PF, (ZOSTAVAX) 60454 UNT/0.65ML injection Inject 19,400 Units into the skin once. Patient not taking: Reported on  05/31/2015 05/24/15   Pleas Koch, NP    Allergies as of 06/01/2015 - Review Complete 05/31/2015  Allergen Reaction Noted  . Codeine Nausea Only 12/20/2014  . Levofloxacin Other (See Comments) 03/22/2015  . Sulfa antibiotics Rash 12/20/2014    Family History  Problem Relation Age of Onset  . Stroke Mother   . Arthritis Father   . Lung cancer Father   . Colon cancer Maternal Aunt   . Breast cancer Paternal Aunt     Social History   Social History  . Marital Status: Married    Spouse Name: N/A  . Number of Children: N/A  . Years of Education: N/A   Occupational History  . Not on file.   Social History Main Topics  . Smoking status: Never Smoker   . Smokeless tobacco: Never Used  . Alcohol Use: No  . Drug Use: No  . Sexual Activity: Not on file   Other Topics Concern  . Not on file   Social History Narrative   Married.   Works for [employer] in [place].   Is a Equities trader.   Enjoys spending time with family, reading, walking. She is raising her granddaughter.       Review of Systems: See HPI, otherwise negative ROS  Physical Exam: BP 103/73 mmHg  Pulse 73  Temp(Src) 97.7 F (36.5 C) (Temporal)  Resp 16  Ht 5\' 4"  (1.626 m)  Wt 136 lb (61.689 kg)  BMI 23.33 kg/m2  SpO2 100% General:  Alert,  pleasant and cooperative in NAD Head:  Normocephalic and atraumatic. Neck:  Supple; no masses or thyromegaly. Lungs:  Clear throughout to auscultation.    Heart:  Regular rate and rhythm. Abdomen:  Soft, nontender and nondistended. Normal bowel sounds, without guarding, and without rebound.   Neurologic:  Alert and  oriented x4;  grossly normal neurologically.  Impression/Plan: [name] is here for an colonoscopy to be performed for constipation  Risks, benefits, limitations, and alternatives regarding  colonoscopy have been reviewed with the patient.  Questions have been answered.  All parties agreeable.   Ollen Bowl, MD   07/27/2015, 8:47 AM

## 2015-07-27 NOTE — Op Note (Signed)
[facility] Patient Name: Natalie [name] Procedure Date: 07/27/2015 9:32 AM MRN: [id number] Account #: [id number] Date of Birth: Jul 08, 1955 Admit Type: Outpatient Age: 60 Room: [facility] OR ROOM 01 Gender: Female Note Status: Finalized Procedure:            Colonoscopy Indications:          Preoperative assessment, Constipation Providers:            Lucilla Lame, MD Referring MD:         Pleas Koch (Referring MD) Medicines:            Propofol per Anesthesia Complications:        No immediate complications. Procedure:            Pre-Anesthesia Assessment:                       - Prior to the procedure, a History and Physical was                        performed, and patient medications and allergies were                        reviewed. The patient's tolerance of previous                        anesthesia was also reviewed. The risks and benefits of                        the procedure and the sedation options and risks were                        discussed with the patient. All questions were                        answered, and informed consent was obtained. Prior                        Anticoagulants: The patient has taken no previous                        anticoagulant or antiplatelet agents. ASA Grade                        Assessment: II - A patient with mild systemic disease.                        After reviewing the risks and benefits, the patient was                        deemed in satisfactory condition to undergo the                        procedure.                       After obtaining informed consent, the colonoscope was                        passed under direct vision. Throughout the procedure,  the patient's blood pressure, pulse, and oxygen                        saturations were monitored continuously. The was                        introduced through the descending colostomy and      advanced to the the cecum, identified by appendiceal                        orifice and ileocecal valve. The colonoscopy was                        performed without difficulty. The patient tolerated the                        procedure well. The quality of the bowel preparation                        was excellent. Findings:      The perianal and digital rectal examinations were normal.      A 4 mm polyp was found in the sigmoid colon. The polyp was sessile. The       polyp was removed with a cold biopsy forceps. Resection and retrieval       were complete.      The scope was advanced from the stoma and the rectum. The polyp was seen       when the scopw was inserted from the rectum. Impression:           - One 4 mm polyp in the sigmoid colon, removed with a                        cold biopsy forceps. Resected and retrieved.                       - The scope was advanced from the stoma and the rectum.                        The polyp was seen when the scopw was inserted from the                        rectum. Recommendation:       - Await pathology results.                       - Repeat colonoscopy in 5 years if polyp adenoma and 10                        years if hyperplastic Procedure Code(s):    --- Professional ---                       [phone], Colonoscopy through stoma; with biopsy, single                        or multiple Diagnosis Code(s):    --- Professional ---                       [id number], Encounter for other preprocedural examination  K59.00, Constipation, unspecified                       D12.5, Benign neoplasm of sigmoid colon CPT copyright 2016 American Medical Association. All rights reserved. The codes documented in this report are preliminary and upon coder review may  be revised to meet current compliance requirements. Lucilla Lame, MD 07/27/2015 10:05:08 AM This report has been signed electronically. Number of Addenda: 0 Note Initiated On:  07/27/2015 9:32 AM Scope Withdrawal Time: 0 hours 3 minutes 46 seconds  Total Procedure Duration: 0 hours 13 minutes 49 seconds       [facility]

## 2015-07-27 NOTE — Transfer of Care (Signed)
Immediate Anesthesia Transfer of Care Note  Patient: Natalie [name]  Procedure(s) Performed: Procedure(s): COLONOSCOPY WITH PROPOFOL (N/A)  Patient Location: PACU  Anesthesia Type: MAC  Level of Consciousness: awake, alert  and patient cooperative  Airway and Oxygen Therapy: Patient Spontanous Breathing and Patient connected to supplemental oxygen  Post-op Assessment: Post-op Vital signs reviewed, Patient's Cardiovascular Status Stable, Respiratory Function Stable, Patent Airway and No signs of Nausea or vomiting  Post-op Vital Signs: Reviewed and stable  Complications: No apparent anesthesia complications

## 2015-07-27 NOTE — Anesthesia Preprocedure Evaluation (Signed)
Anesthesia Evaluation  Patient identified by MRN, date of birth, ID band  Reviewed: Allergy & Precautions, H&P , NPO status , Patient's Chart, lab work & pertinent test results  Airway Mallampati: II  TM Distance: >3 FB Neck ROM: full    Dental no notable dental hx.    Pulmonary    Pulmonary exam normal       Cardiovascular Rhythm:regular Rate:Normal     Neuro/Psych    GI/Hepatic   Endo/Other    Renal/GU      Musculoskeletal   Abdominal   Peds  Hematology   Anesthesia Other Findings   Reproductive/Obstetrics                             Anesthesia Physical Anesthesia Plan  ASA: II  Anesthesia Plan: MAC   Post-op Pain Management:    Induction:   Airway Management Planned:   Additional Equipment:   Intra-op Plan:   Post-operative Plan:   Informed Consent: I have reviewed the patients History and Physical, chart, labs and discussed the procedure including the risks, benefits and alternatives for the proposed anesthesia with the patient or authorized representative who has indicated his/her understanding and acceptance.     Plan Discussed with: CRNA  Anesthesia Plan Comments:         Anesthesia Quick Evaluation  

## 2015-07-30 ENCOUNTER — Encounter: Payer: Self-pay | Admitting: Gastroenterology

## 2015-07-31 ENCOUNTER — Encounter: Payer: Self-pay | Admitting: Gastroenterology

## 2015-08-02 ENCOUNTER — Encounter: Payer: Self-pay | Admitting: Gastroenterology

## 2015-08-03 ENCOUNTER — Other Ambulatory Visit: Payer: Self-pay

## 2015-08-06 ENCOUNTER — Other Ambulatory Visit: Payer: Self-pay

## 2015-08-08 ENCOUNTER — Encounter: Payer: Self-pay | Admitting: Surgery

## 2015-08-08 ENCOUNTER — Ambulatory Visit (INDEPENDENT_AMBULATORY_CARE_PROVIDER_SITE_OTHER): Payer: Managed Care, Other (non HMO) | Admitting: Surgery

## 2015-08-08 VITALS — BP 142/97 | HR 70 | Temp 97.6°F | Ht 64.0 in | Wt 142.0 lb

## 2015-08-08 DIAGNOSIS — K5909 Other constipation: Secondary | ICD-10-CM

## 2015-08-08 DIAGNOSIS — K5720 Diverticulitis of large intestine with perforation and abscess without bleeding: Secondary | ICD-10-CM | POA: Diagnosis not present

## 2015-08-08 DIAGNOSIS — R69 Illness, unspecified: Secondary | ICD-10-CM

## 2015-08-08 DIAGNOSIS — R198 Other specified symptoms and signs involving the digestive system and abdomen: Secondary | ICD-10-CM

## 2015-08-08 DIAGNOSIS — K5900 Constipation, unspecified: Secondary | ICD-10-CM | POA: Diagnosis not present

## 2015-08-08 MED ORDER — NEOMYCIN SULFATE 500 MG PO TABS: 1000.0000 mg | ORAL_TABLET | Freq: Three times a day (TID) | ORAL | Status: DC

## 2015-08-08 MED ORDER — FLEET ENEMA 7-19 GM/118ML RE ENEM: 1.0000 | ENEMA | Freq: Once | RECTAL | Status: DC

## 2015-08-08 MED ORDER — ERYTHROMYCIN BASE 500 MG PO TABS: 1000.0000 mg | ORAL_TABLET | Freq: Three times a day (TID) | ORAL | Status: DC

## 2015-08-08 MED ORDER — POLYETHYLENE GLYCOL 3350 17 GM/SCOOP PO POWD: 1.0000 | Freq: Once | ORAL | Status: DC

## 2015-08-08 MED ORDER — BISACODYL 5 MG PO TBEC: 20.0000 mg | DELAYED_RELEASE_TABLET | Freq: Once | ORAL | Status: DC

## 2015-08-08 NOTE — Progress Notes (Signed)
Subjective:     Patient ID: Natalie [name], female   DOB: 06-04-1955, 60 y.o.   MRN: [id number]  HPI  60 yr old female Well-known to the surgery service after she had a perforated diverticulitis with contamination and required exploratory laparotomy colostomy and had recurrent abscesses including empyema had to be drained. Patient is now a few months out from this and is doing much better strength has returned. Patient's appetite has improved. Patient also has a history of chronic constipation which is improving as well. She has been seeing Dr.Wohl for this and has a regimen as well although he does recommend taking more of the colon out of the time of her reversal.  She did recently have a colonoscopy which showed 1 small hyperplastic polyp but otherwise clear.  Past Medical History  Diagnosis Date  . Blood in stool 2/20016  . Diverticulitis   . UTI (lower urinary tract infection)   . Chronic sinusitis   . Colostomy in place [facility])    Past Surgical History  Procedure Laterality Date  . Laparotomy N/A 03/01/2015    Procedure: EXPLORATORY LAPAROTOMY, SIGMOID COLECTOMY, COLOSTOMY;  Surgeon: Hubbard Robinson, MD;  Location: [facility] ORS;  Service: General;  Laterality: N/A;  . Colon surgery    . Colonoscopy with propofol N/A 07/27/2015    Procedure: COLONOSCOPY WITH PROPOFOL;  Surgeon: Lucilla Lame, MD;  Location: [place];  Service: Endoscopy;  Laterality: N/A;   Family History  Problem Relation Age of Onset  . Stroke Mother   . Arthritis Father   . Lung cancer Father   . Colon cancer Maternal Aunt   . Breast cancer Paternal Aunt    Social History   Social History  . Marital Status: Married    Spouse Name: N/A  . Number of Children: N/A  . Years of Education: N/A   Social History Main Topics  . Smoking status: Never Smoker   . Smokeless tobacco: Never Used  . Alcohol Use: No  . Drug Use: No  . Sexual Activity: Not Asked   Other Topics Concern  . None   Social  History Narrative   Married.   Works for [employer] in [place].   Is a Equities trader.   Enjoys spending time with family, reading, walking. She is raising her granddaughter.       Current outpatient prescriptions:  .  cholecalciferol (VITAMIN D) 1000 units tablet, Take 1,000 Units by mouth daily., Disp: , Rfl:  .  fluticasone (FLONASE) 50 MCG/ACT nasal spray, Place 1 spray into both nostrils daily as needed for allergies or rhinitis. Reported on 03/27/2015, Disp: , Rfl:  .  bisacodyl (DULCOLAX) 5 MG EC tablet, Take 4 tablets (20 mg total) by mouth once., Disp: 4 tablet, Rfl: 0 .  erythromycin base (E-MYCIN) 500 MG tablet, Take 2 tablets (1,000 mg total) by mouth 3 (three) times daily., Disp: 6 tablet, Rfl: 0 .  neomycin (MYCIFRADIN) 500 MG tablet, Take 2 tablets (1,000 mg total) by mouth 3 (three) times daily., Disp: 6 tablet, Rfl: 0 .  polyethylene glycol powder (GLYCOLAX/MIRALAX) powder, Take 255 g by mouth once., Disp: 255 g, Rfl: 0 .  sodium phosphate (FLEET) 7-19 GM/118ML ENEM, Place 133 mLs (1 enema total) rectally once., Disp: 1 Bottle, Rfl: 0 Allergies  Allergen Reactions  . Codeine Nausea Only  . Levofloxacin Other (See Comments)    insomnia  . Sulfa Antibiotics Rash       Review of Systems  Constitutional:  Negative for fever, activity change, appetite change and fatigue.  HENT: Negative for congestion and sore throat.   Respiratory: Negative for cough, shortness of breath and wheezing.   Cardiovascular: Negative for chest pain, palpitations and leg swelling.  Gastrointestinal: Negative for nausea, vomiting, abdominal pain, diarrhea, constipation, blood in stool and abdominal distention.  Genitourinary: Negative for dysuria, hematuria and difficulty urinating.  Musculoskeletal: Negative for back pain and joint swelling.  Skin: Negative for color change, pallor, rash and wound.  Neurological: Negative for weakness and numbness.  Hematological: Negative  for adenopathy. Does not bruise/bleed easily.  Psychiatric/Behavioral: Negative for agitation. The patient is not nervous/anxious.   All other systems reviewed and are negative.      Filed Vitals:   08/08/15 1035  BP: 142/97  Pulse: 70  Temp: 97.6 F (36.4 C)    Objective:   Physical Exam  Constitutional: She is oriented to person, place, and time. She appears well-developed and well-nourished. No distress.  HENT:  Head: Normocephalic and atraumatic.  Right Ear: External ear normal.  Left Ear: External ear normal.  Nose: Nose normal.  Mouth/Throat: Oropharynx is clear and moist. No oropharyngeal exudate.  Eyes: Conjunctivae and EOM are normal. Pupils are equal, round, and reactive to light. No scleral icterus.  Neck: Normal range of motion. Neck supple. No tracheal deviation present.  Cardiovascular: Normal rate, regular rhythm, normal heart sounds and intact distal pulses.  Exam reveals no gallop and no friction rub.   No murmur heard. Pulmonary/Chest: Effort normal and breath sounds normal. No respiratory distress. She has no wheezes. She has no rales.  Abdominal: Soft. Bowel sounds are normal. She exhibits no distension. There is no tenderness. There is no rebound and no guarding.  Midline wound well healed, soft, non distended, ostomy pink and patent with air and stool in bag  Musculoskeletal: Normal range of motion. She exhibits no edema or tenderness.  Neurological: She is alert and oriented to person, place, and time. No cranial nerve deficit.  Skin: Skin is warm and dry. No rash noted. No erythema. No pallor.  Psychiatric: She has a normal mood and affect. Her behavior is normal. Judgment and thought content normal.  Vitals reviewed.      Assessment:     60 yr old female s/p perforated diverticulitis requiring colostomy    Plan:     Patient is now a few months out from Colostomy and improving from chronic constipation.  I discussed open colostomy reversal with  removal of descending colon. The risks, benefits, complications, treatment options, and expected outcomes were discussed with the patient. The possibilities of bleeding, recurrent infection, perforation of viscus organs, damage to surrounding structures, needing a drain placed, the need for additional procedures, reaction to medication, pulmonary aspiration, failure to diagnose a condition, and creating a complication requiring transfusion or operation were discussed with the patient. The patient was given the opportunity to ask questions and have them answered.  She will be scheduled for June 20th.

## 2015-08-08 NOTE — Patient Instructions (Signed)
We have seen you today to speak about putting your colon back together and removing your colostomy. We will arrange for this surgery to be done on 09/25/15 at [facility] by Dr. Azalee Course.  You will need to complete a bowel prep prior to your surgery, please see the information sheet provided today for your directions.  Also, there will be 2 different antibiotics that you will need to take the day of your bowel prep: Neomycin and Erythromycin. You will take 2 tablets of each medication 3 times on the day of your bowel prep- 8am, 2pm, and 8pm.  Please see the (blue) Pre-care sheet for the details about your scheduled surgery.

## 2015-08-10 ENCOUNTER — Telehealth: Payer: Self-pay | Admitting: Surgery

## 2015-08-10 NOTE — Telephone Encounter (Signed)
Pt advised of pre op date/time and sx date. Sx: 09/26/15 with Dr Azalee Course, [place] assisting--Open colostomy takedown. Pre op: 09/18/15 @ 9:00am--Office.   Patient made aware to call [phone], between 1-3:00pm the day before surgery, to find out what time to arrive.

## 2015-09-18 ENCOUNTER — Telehealth: Payer: Self-pay

## 2015-09-18 ENCOUNTER — Encounter
Admission: RE | Admit: 2015-09-18 | Discharge: 2015-09-18 | Disposition: A | Discharge status: Home / Self Care | Payer: Managed Care, Other (non HMO) | Source: Ambulatory Visit | Attending: Surgery | Admitting: Surgery

## 2015-09-18 DIAGNOSIS — Z01812 Encounter for preprocedural laboratory examination: Secondary | ICD-10-CM | POA: Diagnosis not present

## 2015-09-18 HISTORY — DX: Pleural effusion, not elsewhere classified: J90

## 2015-09-18 HISTORY — DX: Cystitis, unspecified without hematuria: N30.90

## 2015-09-18 HISTORY — DX: Other constipation: K59.09

## 2015-09-18 LAB — CBC WITH DIFFERENTIAL/PLATELET
Basophils Absolute: 0.1 10*3/uL (ref 0–0.1)
Basophils Relative: 1 %
EOS ABS: 0.4 10*3/uL (ref 0–0.7)
EOS PCT: 8 %
HCT: 40.8 % (ref 35.0–47.0)
Hemoglobin: 14.1 g/dL (ref 12.0–16.0)
Lymphocytes Relative: 32 %
Lymphs Abs: 1.8 10*3/uL (ref 1.0–3.6)
MCH: 32.4 pg (ref 26.0–34.0)
MCHC: 34.6 g/dL (ref 32.0–36.0)
MCV: 93.5 fL (ref 80.0–100.0)
Monocytes Absolute: 0.5 10*3/uL (ref 0.2–0.9)
Monocytes Relative: 9 %
NEUTROS PCT: 50 %
Neutro Abs: 2.8 10*3/uL (ref 1.4–6.5)
PLATELETS: 269 10*3/uL (ref 150–440)
RBC: 4.37 MIL/uL (ref 3.80–5.20)
RDW: 13.7 % (ref 11.5–14.5)
WBC: 5.5 10*3/uL (ref 3.6–11.0)

## 2015-09-18 LAB — BASIC METABOLIC PANEL
Anion gap: 8 (ref 5–15)
BUN: 15 mg/dL (ref 6–20)
CO2: 27 mmol/L (ref 22–32)
CREATININE: 0.76 mg/dL (ref 0.44–1.00)
Calcium: 9.4 mg/dL (ref 8.9–10.3)
Chloride: 105 mmol/L (ref 101–111)
Glucose, Bld: 89 mg/dL (ref 65–99)
Potassium: 4.2 mmol/L (ref 3.5–5.1)
SODIUM: 140 mmol/L (ref 135–145)

## 2015-09-18 LAB — SURGICAL PCR SCREEN
MRSA, PCR: NEGATIVE
Staphylococcus aureus: POSITIVE — AB

## 2015-09-18 NOTE — Patient Instructions (Signed)
  Your procedure is scheduled on: Wednesday September 26, 2015. Report to Same Day Surgery. To find out your arrival time please call [phone] between 1PM - 3PM on Tuesday September 25, 2015.  Remember: Instructions that are not followed completely may result in serious medical risk, up to and including death, or upon the discretion of your surgeon and anesthesiologist your surgery may need to be rescheduled.    _x___ 1. Do not eat food or drink liquids after midnight. No gum chewing or  hard candies.     ____ 2. No Alcohol for 24 hours before or after surgery.   ____ 3. Bring all medications with you on the day of surgery if instructed.    __x__ 4. Notify your doctor if there is any change in your medical condition     (cold, fever, infections).     Do not wear jewelry, make-up, hairpins, clips or nail polish.  Do not wear lotions, powders, or perfumes. You may wear deodorant.  Do not shave 48 hours prior to surgery. Men may shave face and neck.  Do not bring valuables to the hospital.    [facility] is not responsible for any belongings or valuables.               Contacts, dentures or bridgework may not be worn into surgery.  Leave your suitcase in the car. After surgery it may be brought to your room.  For patients admitted to the hospital, discharge time is determined by your treatment team.   Patients discharged the day of surgery will not be allowed to drive home.    Please read over the following fact sheets that you were given:   [facility] Preparing for Surgery  ____ Take these medicines the morning of surgery with A SIP OF WATER: follow prep instructions per surgeon.  _x___ Fleet Enema (as directed)   _x___ Use CHG Soap as directed on instruction sheet  ____ Use inhalers on the day of surgery and bring to hospital day of surgery  ____ Stop metformin 2 days prior to surgery    ____ Take 1/2 of usual insulin dose the night before surgery and none on the morning of   surgery.   ____ Stop Coumadin/Plavix/aspirin on does not apply.  _x___ Stop Anti-inflammatories such as Advil, Aleve, Ibuprofen, Motrin, Naproxen, Naprosyn, Goodies powders or aspirin products. Tylenol OK to take.   _x___ Stop supplementscholecalciferol (VITAMIN D)   until after surgery.    ____ Bring C-Pap to the hospital.

## 2015-09-18 NOTE — Telephone Encounter (Signed)
Tomi Bamberger called and informed me that Pre-op PCR screen is positive for Staph but is negative for MRSA. No further orders for this patient at this time per protocol.

## 2015-09-18 NOTE — Pre-Procedure Instructions (Signed)
Dr. Azalee Course office contacted and reported MRSA negative with Staph. Aureus positive.  No new ordered.

## 2015-09-18 NOTE — Pre-Procedure Instructions (Signed)
Maritza at Dr. Geoffry Paradise office notified pt's H&P will be out of date for surgery on 09/26/15.

## 2015-09-19 ENCOUNTER — Telehealth: Payer: Self-pay

## 2015-09-19 NOTE — Telephone Encounter (Signed)
Marsha from Pre-Admit called stating that patient was to have surgery on 09/26/2015 by Dr. Azalee Course. However, she needed a recent H&P before surgery. Dr. Azalee Course was contacted about patient's H&P and she stated that she would do her H&P prior to her surgery.

## 2015-09-21 MED ORDER — ACETAMINOPHEN 325 MG PO TABS: 325.0000 mg | ORAL_TABLET | ORAL | Status: DC | PRN

## 2015-09-21 MED ORDER — ACETAMINOPHEN 160 MG/5ML PO SOLN: 325.0000 mg | ORAL | Status: DC | PRN

## 2015-09-25 ENCOUNTER — Telehealth: Payer: Self-pay

## 2015-09-25 NOTE — Telephone Encounter (Signed)
Patient called in at this time and states that she is having nausea with the pre-op antibiotics that she is taking today with her bowel prep. I explained to patient that she may stop taking these medications or we can send in Zofran for her to take prior to each dose.  She has decided to try to continue antibiotics but take them separately an hour apart. I advised patient that if she is not becoming nauseous taking medications this way, she absolutely can take the antibiotics this way. She is reassured by this.

## 2015-09-26 ENCOUNTER — Encounter: Payer: Self-pay | Admitting: Anesthesiology

## 2015-09-26 ENCOUNTER — Inpatient Hospital Stay: Payer: Managed Care, Other (non HMO)

## 2015-09-26 ENCOUNTER — Encounter
Admission: RE | Disposition: A | Discharge status: Home / Self Care | Payer: Self-pay | Source: Ambulatory Visit | Attending: Surgery

## 2015-09-26 ENCOUNTER — Inpatient Hospital Stay: Payer: Managed Care, Other (non HMO) | Admitting: Anesthesiology

## 2015-09-26 ENCOUNTER — Inpatient Hospital Stay
Admission: RE | Admit: 2015-09-26 | Discharge: 2015-09-30 | DRG: 337 | Disposition: A | Discharge status: Home / Self Care | Payer: Managed Care, Other (non HMO) | Source: Ambulatory Visit | Attending: Surgery | Admitting: Surgery

## 2015-09-26 DIAGNOSIS — R69 Illness, unspecified: Secondary | ICD-10-CM | POA: Diagnosis not present

## 2015-09-26 DIAGNOSIS — K5720 Diverticulitis of large intestine with perforation and abscess without bleeding: Secondary | ICD-10-CM | POA: Diagnosis not present

## 2015-09-26 DIAGNOSIS — K5909 Other constipation: Secondary | ICD-10-CM | POA: Diagnosis present

## 2015-09-26 DIAGNOSIS — K660 Peritoneal adhesions (postprocedural) (postinfection): Secondary | ICD-10-CM | POA: Diagnosis present

## 2015-09-26 DIAGNOSIS — K5900 Constipation, unspecified: Secondary | ICD-10-CM | POA: Diagnosis not present

## 2015-09-26 DIAGNOSIS — Z4889 Encounter for other specified surgical aftercare: Secondary | ICD-10-CM

## 2015-09-26 DIAGNOSIS — R319 Hematuria, unspecified: Secondary | ICD-10-CM | POA: Diagnosis not present

## 2015-09-26 DIAGNOSIS — Z8719 Personal history of other diseases of the digestive system: Secondary | ICD-10-CM

## 2015-09-26 DIAGNOSIS — Z433 Encounter for attention to colostomy: Secondary | ICD-10-CM | POA: Diagnosis present

## 2015-09-26 DIAGNOSIS — T83122A Displacement of urinary stent, initial encounter: Secondary | ICD-10-CM

## 2015-09-26 HISTORY — PX: COLOSTOMY REVERSAL: SHX5782

## 2015-09-26 SURGERY — COLOSTOMY REVERSAL
Anesthesia: General | Wound class: Clean Contaminated

## 2015-09-26 MED ORDER — FLEET ENEMA 7-19 GM/118ML RE ENEM: 1.0000 | ENEMA | Freq: Once | RECTAL | Status: DC

## 2015-09-26 MED ORDER — PROPOFOL 10 MG/ML IV BOLUS
INTRAVENOUS | Status: DC | PRN
  Administered 2015-09-26: 100 mg via INTRAVENOUS

## 2015-09-26 MED ORDER — SODIUM CHLORIDE 0.9 % IJ SOLN
INTRAMUSCULAR | Status: AC
Start: 2015-09-26 — End: 2015-09-26
  Filled 2015-09-26: qty 50

## 2015-09-26 MED ORDER — ONDANSETRON HCL 4 MG/2ML IJ SOLN
INTRAMUSCULAR | Status: DC | PRN
  Administered 2015-09-26: 4 mg via INTRAVENOUS

## 2015-09-26 MED ORDER — EPHEDRINE SULFATE 50 MG/ML IJ SOLN
INTRAMUSCULAR | Status: DC | PRN
  Administered 2015-09-26 (×4): 10 mg via INTRAVENOUS

## 2015-09-26 MED ORDER — FENTANYL CITRATE (PF) 100 MCG/2ML IJ SOLN
INTRAMUSCULAR | Status: DC | PRN
  Administered 2015-09-26: 50 ug via INTRAVENOUS
  Administered 2015-09-26 (×2): 25 ug via INTRAVENOUS
  Administered 2015-09-26 (×3): 50 ug via INTRAVENOUS
  Administered 2015-09-26: 25 ug via INTRAVENOUS
  Administered 2015-09-26 (×2): 50 ug via INTRAVENOUS
  Administered 2015-09-26: 25 ug via INTRAVENOUS

## 2015-09-26 MED ORDER — PHENYLEPHRINE HCL 10 MG/ML IJ SOLN
INTRAMUSCULAR | Status: DC | PRN
  Administered 2015-09-26 (×12): 100 ug via INTRAVENOUS
  Administered 2015-09-26: 200 ug via INTRAVENOUS
  Administered 2015-09-26 (×2): 100 ug via INTRAVENOUS

## 2015-09-26 MED ORDER — HYDROMORPHONE HCL 1 MG/ML IJ SOLN
0.5000 mg | INTRAMUSCULAR | Status: DC | PRN
  Administered 2015-09-26 – 2015-09-27 (×2): 0.5 mg via INTRAVENOUS
  Filled 2015-09-26 (×2): qty 1

## 2015-09-26 MED ORDER — METOCLOPRAMIDE HCL 5 MG/ML IJ SOLN
INTRAMUSCULAR | Status: AC
  Administered 2015-09-26: 10 mg via INTRAVENOUS
  Filled 2015-09-26: qty 2

## 2015-09-26 MED ORDER — LACTATED RINGERS IV SOLN
INTRAVENOUS | Status: DC
Start: 2015-09-26 — End: 2015-09-26
  Administered 2015-09-26 (×4): via INTRAVENOUS

## 2015-09-26 MED ORDER — KETOROLAC TROMETHAMINE 30 MG/ML IJ SOLN
30.0000 mg | Freq: Four times a day (QID) | INTRAMUSCULAR | Status: DC
  Administered 2015-09-26 – 2015-09-30 (×15): 30 mg via INTRAVENOUS
  Filled 2015-09-26 (×15): qty 1

## 2015-09-26 MED ORDER — BUPIVACAINE LIPOSOME 1.3 % IJ SUSP
INTRAMUSCULAR | Status: DC | PRN
  Administered 2015-09-26: 50 mL

## 2015-09-26 MED ORDER — POLYETHYLENE GLYCOL 3350 17 GM/SCOOP PO POWD
1.0000 | Freq: Once | ORAL | Status: DC
  Filled 2015-09-26: qty 255

## 2015-09-26 MED ORDER — FENTANYL CITRATE (PF) 100 MCG/2ML IJ SOLN: 25.0000 ug | INTRAMUSCULAR | Status: DC | PRN

## 2015-09-26 MED ORDER — ONDANSETRON HCL 4 MG/2ML IJ SOLN
4.0000 mg | Freq: Once | INTRAMUSCULAR | Status: AC | PRN
  Administered 2015-09-26: 4 mg via INTRAVENOUS

## 2015-09-26 MED ORDER — FAMOTIDINE 20 MG PO TABS
20.0000 mg | ORAL_TABLET | Freq: Once | ORAL | Status: AC
  Administered 2015-09-26: 20 mg via ORAL

## 2015-09-26 MED ORDER — ONDANSETRON HCL 4 MG/2ML IJ SOLN
INTRAMUSCULAR | Status: AC
  Filled 2015-09-26: qty 2

## 2015-09-26 MED ORDER — OXYCODONE HCL 5 MG PO TABS
5.0000 mg | ORAL_TABLET | ORAL | Status: DC | PRN
  Administered 2015-09-26: 5 mg via ORAL
  Filled 2015-09-26: qty 1

## 2015-09-26 MED ORDER — FLUTICASONE PROPIONATE 50 MCG/ACT NA SUSP
1.0000 | Freq: Every day | NASAL | Status: DC | PRN
  Filled 2015-09-26: qty 16

## 2015-09-26 MED ORDER — BUPIVACAINE LIPOSOME 1.3 % IJ SUSP
INTRAMUSCULAR | Status: AC
  Filled 2015-09-26: qty 20

## 2015-09-26 MED ORDER — ROCURONIUM BROMIDE 100 MG/10ML IV SOLN
INTRAVENOUS | Status: DC | PRN
  Administered 2015-09-26: 10 mg via INTRAVENOUS
  Administered 2015-09-26: 20 mg via INTRAVENOUS
  Administered 2015-09-26 (×2): 10 mg via INTRAVENOUS
  Administered 2015-09-26: 30 mg via INTRAVENOUS
  Administered 2015-09-26: 10 mg via INTRAVENOUS

## 2015-09-26 MED ORDER — ONDANSETRON HCL 4 MG/2ML IJ SOLN
4.0000 mg | INTRAMUSCULAR | Status: DC
  Administered 2015-09-26 – 2015-09-27 (×3): 4 mg via INTRAVENOUS
  Filled 2015-09-26 (×3): qty 2

## 2015-09-26 MED ORDER — ENOXAPARIN SODIUM 40 MG/0.4ML ~~LOC~~ SOLN
40.0000 mg | SUBCUTANEOUS | Status: DC
  Administered 2015-09-26 – 2015-09-29 (×4): 40 mg via SUBCUTANEOUS
  Filled 2015-09-26 (×4): qty 0.4

## 2015-09-26 MED ORDER — ACETAMINOPHEN 10 MG/ML IV SOLN
INTRAVENOUS | Status: AC
  Filled 2015-09-26: qty 100

## 2015-09-26 MED ORDER — METOCLOPRAMIDE HCL 5 MG/ML IJ SOLN
10.0000 mg | Freq: Once | INTRAMUSCULAR | Status: AC
  Administered 2015-09-26: 10 mg via INTRAVENOUS

## 2015-09-26 MED ORDER — ACETAMINOPHEN 500 MG PO TABS
1000.0000 mg | ORAL_TABLET | Freq: Four times a day (QID) | ORAL | Status: DC
  Administered 2015-09-27 – 2015-09-30 (×11): 1000 mg via ORAL
  Filled 2015-09-26 (×11): qty 2

## 2015-09-26 MED ORDER — ONDANSETRON 8 MG PO TBDP: 4.0000 mg | ORAL_TABLET | Freq: Four times a day (QID) | ORAL | Status: DC | PRN

## 2015-09-26 MED ORDER — SODIUM CHLORIDE 0.9 % IV SOLN
1.0000 g | INTRAVENOUS | Status: AC
  Administered 2015-09-26: 1 g via INTRAVENOUS
  Filled 2015-09-26 (×2): qty 1

## 2015-09-26 MED ORDER — ACETAMINOPHEN 10 MG/ML IV SOLN
INTRAVENOUS | Status: DC | PRN
  Administered 2015-09-26: 1000 mg via INTRAVENOUS

## 2015-09-26 MED ORDER — HYDROMORPHONE HCL 1 MG/ML IJ SOLN
INTRAMUSCULAR | Status: AC
  Administered 2015-09-26: 0.25 mg via INTRAVENOUS
  Filled 2015-09-26: qty 1

## 2015-09-26 MED ORDER — PANTOPRAZOLE SODIUM 40 MG PO TBEC
40.0000 mg | DELAYED_RELEASE_TABLET | Freq: Every day | ORAL | Status: DC
  Administered 2015-09-27 – 2015-09-30 (×4): 40 mg via ORAL
  Filled 2015-09-26 (×4): qty 1

## 2015-09-26 MED ORDER — BUPIVACAINE HCL (PF) 0.5 % IJ SOLN
INTRAMUSCULAR | Status: AC
  Filled 2015-09-26: qty 30

## 2015-09-26 MED ORDER — FAMOTIDINE 20 MG PO TABS
ORAL_TABLET | ORAL | Status: AC
  Filled 2015-09-26: qty 1

## 2015-09-26 MED ORDER — ONDANSETRON HCL 4 MG/2ML IJ SOLN
4.0000 mg | Freq: Four times a day (QID) | INTRAMUSCULAR | Status: DC | PRN
  Administered 2015-09-26: 4 mg via INTRAVENOUS
  Filled 2015-09-26: qty 2

## 2015-09-26 MED ORDER — DEXAMETHASONE SODIUM PHOSPHATE 10 MG/ML IJ SOLN
INTRAMUSCULAR | Status: DC | PRN
  Administered 2015-09-26: 10 mg via INTRAVENOUS

## 2015-09-26 MED ORDER — CHLORHEXIDINE GLUCONATE 4 % EX LIQD: 1.0000 "application " | Freq: Once | CUTANEOUS | Status: DC

## 2015-09-26 MED ORDER — HYDROMORPHONE HCL 1 MG/ML IJ SOLN
INTRAMUSCULAR | Status: AC
  Administered 2015-09-26: 0.5 mg via INTRAVENOUS
  Filled 2015-09-26: qty 1

## 2015-09-26 MED ORDER — HYDROMORPHONE HCL 1 MG/ML IJ SOLN
0.2500 mg | INTRAMUSCULAR | Status: DC | PRN
  Administered 2015-09-26 (×3): 0.25 mg via INTRAVENOUS
  Administered 2015-09-26: 0.5 mg via INTRAVENOUS
  Administered 2015-09-26: 0.25 mg via INTRAVENOUS
  Administered 2015-09-26: 0.5 mg via INTRAVENOUS

## 2015-09-26 MED ORDER — CYCLOBENZAPRINE HCL 10 MG PO TABS
10.0000 mg | ORAL_TABLET | Freq: Three times a day (TID) | ORAL | Status: DC
  Administered 2015-09-26 – 2015-09-27 (×4): 10 mg via ORAL
  Filled 2015-09-26 (×5): qty 1

## 2015-09-26 MED ORDER — DEXTROSE IN LACTATED RINGERS 5 % IV SOLN
INTRAVENOUS | Status: DC
  Administered 2015-09-26 – 2015-09-30 (×8): via INTRAVENOUS

## 2015-09-26 MED ORDER — SUGAMMADEX SODIUM 200 MG/2ML IV SOLN
INTRAVENOUS | Status: DC | PRN
  Administered 2015-09-26: 120 mg via INTRAVENOUS

## 2015-09-26 MED ORDER — FENTANYL CITRATE (PF) 100 MCG/2ML IJ SOLN
INTRAMUSCULAR | Status: AC
  Filled 2015-09-26: qty 2

## 2015-09-26 SURGICAL SUPPLY — 49 items
BAG BILE T-TUBES STRL (MISCELLANEOUS) IMPLANT
CANISTER SUCT 1200ML W/VALVE (MISCELLANEOUS) ×4 IMPLANT
CATH TRAY 16F METER LATEX (MISCELLANEOUS) ×4 IMPLANT
CATH URETL 5X70 OPEN END (CATHETERS) ×8 IMPLANT
CHLORAPREP W/TINT 26ML (MISCELLANEOUS) ×4 IMPLANT
DRAIN PENROSE 1/4X12 LTX (DRAIN) ×4 IMPLANT
DRAPE LAPAROTOMY 100X77 ABD (DRAPES) ×4 IMPLANT
DRAPE LEGGINS SURG 28X43 STRL (DRAPES) ×4 IMPLANT
DRAPE UNDER BUTCK 40X44W/POUCH (DRAPES) ×4 IMPLANT
DRAPE UNDER BUTTOCK W/FLU (DRAPES) ×4 IMPLANT
DRSG OPSITE POSTOP 4X12 (GAUZE/BANDAGES/DRESSINGS) ×4 IMPLANT
ELECT BLADE 6.5 EXT (BLADE) IMPLANT
ELECT CAUTERY BLADE 6.4 (BLADE) ×4 IMPLANT
ELECT REM PT RETURN 9FT ADLT (ELECTROSURGICAL) ×4
ELECTRODE REM PT RTRN 9FT ADLT (ELECTROSURGICAL) ×2 IMPLANT
GLOVE PI ORTHOPRO 6.5 (GLOVE) ×4
GLOVE PI ORTHOPRO STRL 6.5 (GLOVE) ×4 IMPLANT
GOWN STRL REUS W/ TWL LRG LVL3 (GOWN DISPOSABLE) ×8 IMPLANT
GOWN STRL REUS W/TWL LRG LVL3 (GOWN DISPOSABLE) ×8
LABEL OR SOLS (LABEL) ×4 IMPLANT
NS IRRIG 1000ML POUR BTL (IV SOLUTION) ×4 IMPLANT
PACK BASIN MAJOR ARMC (MISCELLANEOUS) ×4 IMPLANT
PACK COLON CLEAN CLOSURE (MISCELLANEOUS) ×4 IMPLANT
RELOAD LINEAR CUT PROX 55 BLUE (ENDOMECHANICALS) ×8 IMPLANT
RETAINER VISCERA MED (MISCELLANEOUS) IMPLANT
SENSORWIRE 0.038 NOT ANGLED (WIRE) ×4
SET CYSTO W/LG BORE CLAMP LF (SET/KITS/TRAYS/PACK) ×4 IMPLANT
SHEARS HARMONIC 23CM COAG (MISCELLANEOUS) ×4 IMPLANT
SHEARS HARMONIC STRL 23CM (MISCELLANEOUS) ×4 IMPLANT
SOL PREP PVP 2OZ (MISCELLANEOUS) ×4
SOLUTION PREP PVP 2OZ (MISCELLANEOUS) ×2 IMPLANT
SPONGE LAP 18X18 5 PK (GAUZE/BANDAGES/DRESSINGS) ×12 IMPLANT
SPONGE LAP 18X36 2PK (MISCELLANEOUS) ×4 IMPLANT
STAPLER CUT CVD 40MM BLUE (STAPLE) ×8 IMPLANT
STAPLER ENDO ILS CVD 18 33 (STAPLE) ×8 IMPLANT
STAPLER PROXIMATE 55 BLUE (STAPLE) ×4 IMPLANT
STAPLER SKIN PROX 35W (STAPLE) ×4 IMPLANT
SURGILUBE 2OZ TUBE FLIPTOP (MISCELLANEOUS) ×4 IMPLANT
SUT PDS AB 2-0 CT1 27 (SUTURE) ×8 IMPLANT
SUT PDS II 4-0 (SUTURE) ×16 IMPLANT
SUT SILK 3 0 (SUTURE) ×2
SUT SILK 3-0 (SUTURE) ×4 IMPLANT
SUT SILK 3-0 18XBRD TIE 12 (SUTURE) ×2 IMPLANT
SUT VIC AB 2-0 CT1 27 (SUTURE) ×2
SUT VIC AB 2-0 CT1 TAPERPNT 27 (SUTURE) ×2 IMPLANT
SYR 30ML LL (SYRINGE) ×4 IMPLANT
SYR BULB IRRIG 60ML STRL (SYRINGE) ×8 IMPLANT
SYRINGE IRR TOOMEY STRL 70CC (SYRINGE) ×4 IMPLANT
WIRE SENSOR 0.038 NOT ANGLED (WIRE) ×2 IMPLANT

## 2015-09-26 NOTE — H&P (Signed)
Natalie [name] is an 60 y.o. female.   Chief Complaint: ostomy HPI: 60 yr old female with perforated diverticulitis requiring Hartman's procedure and prolonged hospital course for empyema.  She is about 6 months out now and has been doing well.  She has been walking daily and having good ostomy output but still has to use miralax and prune juice daily.  Patient otherwise has no complains and is ready to have hte ostomy reversed today.  Past Medical History  Diagnosis Date  . Blood in stool 2/20016  . Diverticulitis   . UTI (lower urinary tract infection)   . Chronic sinusitis   . Colostomy in place [facility])   . Pleural effusion on right 2016  . Cystitis   . Chronic constipation   . Shortness of breath dyspnea     with exersion, due to limited lung expasion    Past Surgical History  Procedure Laterality Date  . Laparotomy N/A 03/01/2015    Procedure: EXPLORATORY LAPAROTOMY, SIGMOID COLECTOMY, COLOSTOMY;  Surgeon: Hubbard Robinson, MD;  Location: [facility] ORS;  Service: General;  Laterality: N/A;  . Colon surgery    . Colonoscopy with propofol N/A 07/27/2015    Procedure: COLONOSCOPY WITH PROPOFOL;  Surgeon: Lucilla Lame, MD;  Location: [facility];  Service: Endoscopy;  Laterality: N/A;    Family History  Problem Relation Age of Onset  . Stroke Mother   . Arthritis Father   . Lung cancer Father   . Colon cancer Maternal Aunt   . Breast cancer Paternal Aunt    Social History:  reports that she has never smoked. She has never used smokeless tobacco. She reports that she does not drink alcohol or use illicit drugs.  Allergies:  Allergies  Allergen Reactions  . Codeine Nausea Only  . Levofloxacin Other (See Comments)    insomnia  . Sulfa Antibiotics Rash    Medications Prior to Admission  Medication Sig Dispense Refill  . bisacodyl (DULCOLAX) 5 MG EC tablet Take 4 tablets (20 mg total) by mouth once. 4 tablet 0  . cholecalciferol (VITAMIN D) 1000 units tablet Take 1,000  Units by mouth daily.    Marland Kitchen erythromycin base (E-MYCIN) 500 MG tablet Take 2 tablets (1,000 mg total) by mouth 3 (three) times daily. 6 tablet 0  . fluticasone (FLONASE) 50 MCG/ACT nasal spray Place 1 spray into both nostrils daily as needed for allergies or rhinitis. Reported on 03/27/2015    . neomycin (MYCIFRADIN) 500 MG tablet Take 2 tablets (1,000 mg total) by mouth 3 (three) times daily. 6 tablet 0  . polyethylene glycol powder (GLYCOLAX/MIRALAX) powder Take 255 g by mouth once. 255 g 0  . sodium phosphate (FLEET) 7-19 GM/118ML ENEM Place 133 mLs (1 enema total) rectally once. 1 Bottle 0    No results found for this or any previous visit (from the past 48 hour(s)). No results found.  Review of Systems  Constitutional: Negative for fever and chills.  HENT: Negative for congestion.   Respiratory: Negative for cough and shortness of breath.   Cardiovascular: Negative for chest pain, palpitations and leg swelling.  Genitourinary: Negative for dysuria and frequency.  Musculoskeletal: Negative for back pain.  Skin: Negative for itching and rash.  Neurological: Negative for dizziness, loss of consciousness, weakness and headaches.  Psychiatric/Behavioral: Negative for depression. The patient is not nervous/anxious.   All other systems reviewed and are negative.   Blood pressure 119/75, pulse 88, temperature 97.8 F (36.6 C), temperature source Oral, resp.  rate 16, height 5' 4.5" (1.638 m), weight 134 lb (60.782 kg), SpO2 100 %. Physical Exam  Vitals reviewed. Constitutional: She is oriented to person, place, and time. She appears well-developed and well-nourished. No distress.  HENT:  Head: Normocephalic and atraumatic.  Right Ear: External ear normal.  Left Ear: External ear normal.  Nose: Nose normal.  Mouth/Throat: Oropharynx is clear and moist.  Cardiovascular: Normal rate, regular rhythm and normal heart sounds.  Exam reveals no friction rub.   No murmur heard. Respiratory:  Effort normal and breath sounds normal. No respiratory distress. She has no wheezes.  GI: Soft. Bowel sounds are normal. She exhibits no distension. There is no tenderness. There is no rebound.  Musculoskeletal: Normal range of motion. She exhibits no edema or tenderness.  Neurological: She is alert and oriented to person, place, and time. No cranial nerve deficit.  Skin: Skin is warm and dry. No rash noted. No erythema. No pallor.  Psychiatric: She has a normal mood and affect. Her behavior is normal. Judgment and thought content normal.     Assessment/Plan 60 yr old female here for colostomy reversal today.  She is unchanged from last visit to the office The risks, benefits, complications, treatment options, and expected outcomes were discussed with the patient. The possibilities of bleeding, recurrent infection, perforation of viscus organs, damage to surrounding structures, needing a drain placed, the need for additional procedures, reaction to medication, pulmonary aspiration, failure to diagnose a condition, and creating a complication requiring transfusion or operation were discussed with the patient. The patient was given the opportunity to ask questions and have them answered.      Hubbard Robinson, MD 09/26/2015, 7:22 AM

## 2015-09-26 NOTE — Op Note (Signed)
Date of procedure: 09/26/2015  Preoperative diagnosis:  1. Diverticulitis  Postoperative diagnosis:  1. Diverticulitis   Procedure: 1. Cystoscopy. 2. Bilateral ureteral catheter placement  Surgeon: Baruch Gouty, MD  Anesthesia: General  Complications: None  Intraoperative findings: Normal bladder on cystoscopy. Correct stent placement on fluoroscopy  EBL:  None  Specimens:  None  Drains:  Bilateral ureteral catheters  Disposition: Stable to the postanesthesia care unit  Indication for procedure: The patient is a 60 y.o. female with  diverticulitis status post colostomy presents today for definitive therapy. Urology was consulted for placement of bilateral ureteral catheters for identification of the ureter.  After reviewing the management options for treatment, the patient elected to proceed with the above surgical procedure(s). We have discussed the potential benefits and risks of the procedure, side effects of the proposed treatment, the likelihood of the patient achieving the goals of the procedure, and any potential problems that might occur during the procedure or recuperation. Informed consent has been obtained.  Description of procedure: The patient was met in the preoperative area. All risks, benefits, and indications of the procedure were described in great detail. The patient consented to the procedure. Preoperative antibiotics were given. The patient was taken to the operative theater. General anesthesia was induced per the anesthesia service. The patient was then placed in the dorsal lithotomy position and prepped and draped in the usual sterile fashion. A preoperative timeout was called.   A 21 French 30 cystoscope was inserted in the patient's bladder per urethra to mark. Pan cystoscopy was unremarkable. The left ureteral orifice was identified and intubated with a sensor wire. This was advanced level of the renal pelvis. Over the catheter exchanged over the sensor  wire and sensor was removed. This was performed in identical fashion on the right side. The left catheter was cut at an oblique angle for identification. The patient's bladder was drained cystoscope was removed. A Foley catheter was placed. Catheters were fixed to the patient's catheter to prevent dislodging. The OR was then transferred to general surgery for treatment of her diverticulitis.  Plan:  The ureteral catheters can be removed by the Gen. surgery service when they are no longer needed.  Baruch Gouty, M.D.

## 2015-09-26 NOTE — Transfer of Care (Signed)
Immediate Anesthesia Transfer of Care Note  Patient: Natalie [name]  Procedure(s) Performed: Procedure(s): COLOSTOMY REVERSAL / TAKE DOWN (N/A) URETERAL CATHETER OR STENT PLACEMENT (Bilateral)  Patient Location: PACU  Anesthesia Type:General  Level of Consciousness: awake, alert  and oriented  Airway & Oxygen Therapy: Patient Spontanous Breathing and Patient connected to face mask oxygen  Post-op Assessment: Report given to RN and Post -op Vital signs reviewed and stable  Post vital signs: Reviewed and stable  Last Vitals:  Filed Vitals:   09/26/15 0606 09/26/15 1440  BP: 119/75 97/55  Pulse: 88 125  Temp: 36.6 C 36.4 C  Resp: 16 20    Last Pain:  Filed Vitals:   09/26/15 1446  PainSc: Asleep         Complications: No apparent anesthesia complications

## 2015-09-26 NOTE — Anesthesia Preprocedure Evaluation (Addendum)
Anesthesia Evaluation  Patient identified by MRN, date of birth, ID band Patient awake    Reviewed: Allergy & Precautions, NPO status , Patient's Chart, lab work & pertinent test results, reviewed documented beta blocker date and time   Airway Mallampati: II  TM Distance: >3 FB     Dental  (+) Chipped, Partial Upper   Pulmonary shortness of breath,           Cardiovascular      Neuro/Psych    GI/Hepatic   Endo/Other    Renal/GU      Musculoskeletal   Abdominal   Peds  Hematology   Anesthesia Other Findings Colostomy take down.  Reproductive/Obstetrics                            Anesthesia Physical Anesthesia Plan  ASA: III  Anesthesia Plan: General   Post-op Pain Management:    Induction: Intravenous  Airway Management Planned: Oral ETT  Additional Equipment:   Intra-op Plan:   Post-operative Plan:   Informed Consent: I have reviewed the patients History and Physical, chart, labs and discussed the procedure including the risks, benefits and alternatives for the proposed anesthesia with the patient or authorized representative who has indicated his/her understanding and acceptance.     Plan Discussed with: CRNA  Anesthesia Plan Comments:         Anesthesia Quick Evaluation

## 2015-09-26 NOTE — Anesthesia Procedure Notes (Signed)
Procedure Name: Intubation Date/Time: 09/26/2015 7:56 AM Performed by: Jonna Clark Pre-anesthesia Checklist: Patient identified, Patient being monitored, Timeout performed, Emergency Drugs available and Suction available Patient Re-evaluated:Patient Re-evaluated prior to inductionOxygen Delivery Method: Circle system utilized Preoxygenation: Pre-oxygenation with 100% oxygen Intubation Type: IV induction Ventilation: Mask ventilation without difficulty Laryngoscope Size: Mac and 3 Grade View: Grade II Tube type: Oral Tube size: 7.0 mm Number of attempts: 1 Placement Confirmation: ETT inserted through vocal cords under direct vision,  positive ETCO2 and breath sounds checked- equal and bilateral Secured at: 21 cm Tube secured with: Tape Dental Injury: Teeth and Oropharynx as per pre-operative assessment

## 2015-09-26 NOTE — Brief Op Note (Signed)
09/26/2015  8:07 PM  PATIENT:  Natalie [name]  60 y.o. female  PRE-OPERATIVE DIAGNOSIS:  Diverticulitis with perforation, colsotomy  POST-OPERATIVE DIAGNOSIS:  Diverticulitis with perforation, colsotomy  PROCEDURE:  Procedure(s): COLOSTOMY REVERSAL / TAKE DOWN (N/A) URETERAL CATHETER OR STENT PLACEMENT (Bilateral)  Lysis of adhesions for 3hrs  SURGEON:  Surgeon(s) and Role:    * Hubbard Robinson, MD - Primary    * Nickie Retort, MD    * Nestor Lewandowsky, MD - Assisting  PHYSICIAN ASSISTANT:   ASSISTANTS: see above   ANESTHESIA:   general  EBL:   100  BLOOD ADMINISTERED:none  DRAINS: Penrose drain in the left lower quadrant site   LOCAL MEDICATIONS USED:  MARCAINE     SPECIMEN:  Source of Specimen:  descending colon  DISPOSITION OF SPECIMEN:  PATHOLOGY  COUNTS:  YES  TOURNIQUET:  * No tourniquets in log *  DICTATION: .Dragon Dictation  PLAN OF CARE: Admit to inpatient   PATIENT DISPOSITION:  PACU - hemodynamically stable.   Delay start of Pharmacological VTE agent (>24hrs) due to surgical blood loss or risk of bleeding: not applicable

## 2015-09-27 ENCOUNTER — Encounter: Payer: Self-pay | Admitting: Surgery

## 2015-09-27 LAB — BASIC METABOLIC PANEL
ANION GAP: 6 (ref 5–15)
BUN: 17 mg/dL (ref 6–20)
CHLORIDE: 110 mmol/L (ref 101–111)
CO2: 24 mmol/L (ref 22–32)
Calcium: 8.6 mg/dL — ABNORMAL LOW (ref 8.9–10.3)
Creatinine, Ser: 1.01 mg/dL — ABNORMAL HIGH (ref 0.44–1.00)
GFR calc Af Amer: >60 mL/min (ref >60)
GFR, EST NON AFRICAN AMERICAN: 59 mL/min — AB (ref >60)
GLUCOSE: 131 mg/dL — AB (ref 65–99)
POTASSIUM: 3.9 mmol/L (ref 3.5–5.1)
SODIUM: 140 mmol/L (ref 135–145)

## 2015-09-27 LAB — CBC
HCT: 35.4 % (ref 35.0–47.0)
HEMOGLOBIN: 12.4 g/dL (ref 12.0–16.0)
MCH: 32.7 pg (ref 26.0–34.0)
MCHC: 35 g/dL (ref 32.0–36.0)
MCV: 93.4 fL (ref 80.0–100.0)
PLATELETS: 231 10*3/uL (ref 150–440)
RBC: 3.8 MIL/uL (ref 3.80–5.20)
RDW: 13.4 % (ref 11.5–14.5)
WBC: 19.5 10*3/uL — AB (ref 3.6–11.0)

## 2015-09-27 MED ORDER — ONDANSETRON HCL 4 MG/2ML IJ SOLN
4.0000 mg | INTRAMUSCULAR | Status: DC | PRN
  Administered 2015-09-28: 4 mg via INTRAVENOUS
  Filled 2015-09-27: qty 2

## 2015-09-27 MED ORDER — ONDANSETRON 4 MG PO TBDP
4.0000 mg | ORAL_TABLET | ORAL | Status: DC | PRN
  Filled 2015-09-27: qty 1

## 2015-09-27 MED ORDER — SCOPOLAMINE 1 MG/3DAYS TD PT72
1.0000 | MEDICATED_PATCH | TRANSDERMAL | Status: DC
  Administered 2015-09-27 – 2015-09-30 (×2): 1.5 mg via TRANSDERMAL
  Filled 2015-09-27 (×2): qty 1

## 2015-09-27 NOTE — Progress Notes (Signed)
60 yr old female POD#1 from Open Colostomy takedown, low pelvic anastomosis, extensive lysis of adhesions.  Patient did well overnight although she did have some nausea. And a scopolamine patch has helped with the nausea and she was sitting up taking in some clear liquids this a.m.  The patient's urine output has picked up and min over a liter overnight. She states that her pain is well controlled with the medications. Patient denies passing any flatus. Patient is performing well on IS and has gotten to 1500.  Filed Vitals:   09/26/15 2152 09/27/15 0516  BP:  112/54  Pulse:  100  Temp: 97.5 F (36.4 C) 98.2 F (36.8 C)  Resp:  17     I/O last 3 completed shifts: In: [id number] [P.O.:60; I.V.:4485] Out: [id number] [Urine:1040; Emesis/NG output:100; Blood:100]     PE:  Gen: NAD, sitting up in chair Abd: soft, appropriately tender, incision midline c/d/i,  LLQ incision clean, with serous drainage, penrose in place Ext: 2+pulses, no edema  CBC Latest Ref Rng 09/27/2015 09/18/2015 05/21/2015  WBC 3.6 - 11.0 K/uL 19.5(H) 5.5 4.6  Hemoglobin 12.0 - 16.0 g/dL 12.4 14.1 12.8  Hematocrit 35.0 - 47.0 % 35.4 40.8 38.5  Platelets 150 - 440 K/uL 231 269 307.0   CMP Latest Ref Rng 09/27/2015 09/18/2015 05/21/2015  Glucose 65 - 99 mg/dL 131(H) 89 96  BUN 6 - 20 mg/dL 17 15 14   Creatinine 0.44 - 1.00 mg/dL 1.01(H) 0.76 0.76  Sodium 135 - 145 mmol/L 140 140 143  Potassium 3.5 - 5.1 mmol/L 3.9 4.2 4.3  Chloride 101 - 111 mmol/L 110 105 108  CO2 22 - 32 mmol/L 24 27 29   Calcium 8.9 - 10.3 mg/dL 8.6(L) 9.4 10.0  Total Protein 6.0 - 8.3 g/dL - - 7.8  Total Bilirubin 0.2 - 1.2 mg/dL - - 0.5  Alkaline Phos 39 - 117 U/L - - 72  AST 0 - 37 U/L - - 22  ALT 0 - 35 U/L - - 20    A/P:  61 yr old female POD#1 from Open Colostomy takedown, low pelvic anastomosis, extensive lysis of adhesions.  Pain: continue scheduled tylenol, toradol and flexeril with prn oxycodone and diluadid GI: continue clear liquid diet until  passing flatus, zofran and scopolomine for nausea, dry dressing to LLQ daily and prn for drainage GU: good UOP, still some blood tinge but no clots, will d/c foley and left stent today Encourage ambulation in the hallway today, continue IS

## 2015-09-27 NOTE — Anesthesia Postprocedure Evaluation (Signed)
Anesthesia Post Note  Patient: Natalie [name]  Procedure(s) Performed: Procedure(s) (LRB): COLOSTOMY REVERSAL / TAKE DOWN (N/A) URETERAL CATHETER OR STENT PLACEMENT (Bilateral)  Patient location during evaluation: PACU Anesthesia Type: General Level of consciousness: awake and alert and oriented Pain management: pain level controlled Vital Signs Assessment: post-procedure vital signs reviewed and stable Respiratory status: spontaneous breathing Cardiovascular status: blood pressure returned to baseline Anesthetic complications: no    Last Vitals:  Filed Vitals:   09/27/15 1307 09/27/15 1309  BP: 107/44 100/48  Pulse: 85 85  Temp: 37.1 C   Resp: 16     Last Pain:  Filed Vitals:   09/27/15 1309  PainSc: 4                  [name]

## 2015-09-27 NOTE — Op Note (Addendum)
Procedure: Open Colostomy takedown with lysis of adhesions,  Mobilization of splenic flexure and colonic anastomosis  Indications: 60 year old female that had perforated diverticulitis approximately 6 months ago that required a Hartman's. The patient has had ostomy since then it has been doing well and healed well. Now here to take her ostomy down to reconnect her bowel.  Pre-operative Diagnosis: Diverticulitis with Hartman's procedure  Post-operative Diagnosis: same; 2. Extensive adhesions  Surgeon: Hubbard Robinson   Assistants: Marta Lamas, MD  Anesthesia: General endotracheal anesthesia  ASA Class: 2  Procedure Details  The patient was seen again in the Holding Room. The benefits, complications, treatment options, and expected outcomes were discussed with the patient. The risks of bleeding, infection, recurrence of symptoms, failure to resolve symptoms,  bowel injury, any of which could require further surgery were reviewed with the patient.   The patient was taken to Operating Room, identified as Natalie [name] and the procedure verified.  A Time Out was held and the above information confirmed.  Prior to the induction of general anesthesia, antibiotic prophylaxis was administered.  General endotracheal anesthesia was then administered and tolerated well.  The patient was then placed in the low lithotomy position in Dr. Pilar Jarvis with urology performed a cystoscopy and placement of bilateral ureteral stents, please see his operative report for full details. The abdomen was prepped with Chloraprep and draped in the sterile fashion.  A second timeout was done and confirmed the correct patient and procedure. A 10 blade scalpel was then used to open the midline incision from above the umbilicus to just above the pubic symphysis. Bovie electric cautery was used to dissect down through subcutaneous tissues until over fascia fascia was then incised and abdomen entered the fascia was elevated up and  opened using a combination of electrocautery and sharp dissection with Metzenbaum scissors. The bowel and omentum were stuck to the abdominal wall so very careful dissection of this down off of the midline was performed in order to open the abdomen the full length of the incision.  The abdomen was then inspected and seen that there is extensive adhesions throughout the entirety of the abdomen. Adhesiolysis was then carefully performed with a combination of electrocautery and sharp dissection carefully separating the bowel from each other and identifying the colon. The adhesiolysis portion of the case took 3 hours and no enterotomies were identified. Once the bowel was separated out and off of the abdominal wall and from adhesions to each other the abdomen was then reinspected. The colon from ascending to transverse and descending colon all appeared healthy going to the ostomy and the small bowel was run in all appeared to be healthy without any enterotomies. The ostomy site was then taken down using Bovie electrocautery to take a small as the skin around the ostomy site and to dissect it away from the fascia was then placed down inside the abdomen and the colon was then dissected off of the retroperitoneum which have been partially done while taking adhesions down. This is gone up the white line of Toldt and around and the splenic flexure was taken down as well.  The omentum was taken off of the transverse colon and the transverse colon mobilized over to the hepatic flexure.  Pelvic flexure were then also mobilized off of the adhesions to the liver and the gallbladder and freed up from the retroperitoneum as well as the duodenum. Did have stents placed and movement were easily palpated and seen that the peritoneum was  left intact over both ureters. With the entirety of the large bowel had been freed up with good mobilization and a point in the mid transverse colon just at the middle colic vessels was identified  and GIA stapler was used to resect the colon antibiotic scalpel was then used to resect the colon off of the mesentery close to the colon wall. The descending colon was then removed off the field.  The rectal stump was identified and inspected and the fat removed from the area. Fair amount was above the pelvic brim approximate 8 cm above.  The transverse colon easily moved down without any tension to this area. The transverse colon was then clamped with the pursestring suture clamp just below the staple line and 2-0 nylon suture on a Keith needle was then placed through the pursestring clamp. The bowel was then cut with curved Mayos and the clamp was then removed and the bowel opened. Dilators were then used to dilate up to 33 mm and 33 mm EEA stapler was brought onto the field and the anvil was placed inside this portion of the bowel. The pursestring suture was then tied down around the end of the anvil.    Attention was then turned to the rectum. The dilators were attempted to be placed inside the rectum but were unable to pass to the rectal stump. A colonoscope was then placed into the rectum and advanced up through this area there was small portionw that could be seen with a torsion in the pelvis. At this time the adhesions were then taken down off of the colon to take the torsion away and created a straight segment of rectum. The 29 and then 33 dilators fit easily up to the rectal stump. The 61 EEA stapler was then placed up through and the amble taken out the anterior portion of the rectal stump. The amble was then placed down and fired across and the stapler removed. There were 2 round full doughnuts removed from the EEA stapler. At this time the sigmoidoscope was then placed back inside the rectum and to the area of anastomosis however there was bubbling seen and a large opening in the colon and appeared to be a problem with the firing of this stapler. There was a hole approximately 2 cm in sides in the  posterior portion of the anastomosis. At this time and does feel better to revise this area and GIA stapler was used to resect the transverse colon from this area just proximal to the previous anastomosis. A contour stapler was then used to resect a further portion of the rectum past the area with the opening.  Again pursestring suture was then placed on the bowel and 2-0 nylon was passed through here, the bowel was then opened up using Mayos and the clamp removed. The 33 anvil from a second EEA stapler was then placed into the bowel and the pursestring suture secured around the anvil. At this time the rectal stump was inspected and 33 mm dilator placed which went easily.  The EEA stapler was then placed in the anvil secured,  the stapler closed and fired well. The sigmoidoscope again was used to inspect the: This time seeing a good anastomosis well placed in all areas without any openings and there was no bubbling seen in the abdomen. Also two well formed doughnuts of colon were removed from the EEA stapler.    At this time the abdomen was inspected and irrigated out.The bowel was sitting without any  tension on the anastomosis.  A tongue of omentum was then used to place down into the pelvis over the anastomosis. The abdomen was clean without any contamination.  After this the clean closure was performed with everyone changing gowns and gloves and new drapes and instruments brought to the field.  A 2-0 PDS was used in a continuous fashion to close the anterior fascia at the ostomy site. This was irrigated out skin edges were cleaned with Bovie electrocautery and hemostasis was achieved. A Penrose drain was then placed inside this and staples used to close the skin.  A continuous suture was run in the abdominal fascia using the short stitch technique with 2-0 PDS 1 from superior and one from inferior. The subcutaneous tissue then irrigated out using sterile saline and hemostasis achieved using electrocautery.  Staples were then used to close the skin. Sterile dressings were applied.  All instrument and sponge counts were correct at the end of the case. Patient tolerated the procedure well.   Findings:  Extensive adhesions requiring 3 hours of adhesiolysis, final anastomosis with good approximation, air tight seal and no bleeding   Estimated Blood Loss: 100cc         Drains: penrose to ostomy site         Specimens: descending colon          Complications: none                Condition: good

## 2015-09-28 LAB — SURGICAL PATHOLOGY

## 2015-09-28 MED ORDER — CYCLOBENZAPRINE HCL 10 MG PO TABS
5.0000 mg | ORAL_TABLET | Freq: Three times a day (TID) | ORAL | Status: DC
  Administered 2015-09-28 – 2015-09-30 (×6): 5 mg via ORAL
  Filled 2015-09-28 (×6): qty 1

## 2015-09-28 NOTE — Progress Notes (Signed)
60 yr old female POD#2 from Open Colostomy takedown, low pelvic anastomosis, extensive lysis of adhesions.  Patient up sitting in chair this Am.  She states that she did well with clear liquids yesterday but doesn't have much of an appetite. She got up and walked in hallway yesterday and this AM.  She states pain well controlled with meds.  She has been passing flatus and had some passage of liquid per rectum. She is voiding well but still some blood in urine.   Filed Vitals:   09/28/15 0418 09/28/15 1245  BP: 103/46 110/55  Pulse: 79 77  Temp: 98.1 F (36.7 C) 98.3 F (36.8 C)  Resp: 20 18     I/O last 3 completed shifts: In: 4107 [P.O.:1440; I.V.:2667] Out: 2151 [Urine:2050; Emesis/NG output:100; Stool:1] Total I/O In: 361.3 [I.V.:361.3] Out: 550 [Urine:550]   PE:  Gen: NAD, sitting up in chair Abd: soft, appropriately tender, incision midline c/d/i,  LLQ incision clean, with serous drainage, penrose in place Ext: 2+pulses, no edema  CBC Latest Ref Rng 09/27/2015 09/18/2015 05/21/2015  WBC 3.6 - 11.0 K/uL 19.5(H) 5.5 4.6  Hemoglobin 12.0 - 16.0 g/dL 12.4 14.1 12.8  Hematocrit 35.0 - 47.0 % 35.4 40.8 38.5  Platelets 150 - 440 K/uL 231 269 307.0   CMP Latest Ref Rng 09/27/2015 09/18/2015 05/21/2015  Glucose 65 - 99 mg/dL 131(H) 89 96  BUN 6 - 20 mg/dL 17 15 14   Creatinine 0.44 - 1.00 mg/dL 1.01(H) 0.76 0.76  Sodium 135 - 145 mmol/L 140 140 143  Potassium 3.5 - 5.1 mmol/L 3.9 4.2 4.3  Chloride 101 - 111 mmol/L 110 105 108  CO2 22 - 32 mmol/L 24 27 29   Calcium 8.9 - 10.3 mg/dL 8.6(L) 9.4 10.0  Total Protein 6.0 - 8.3 g/dL - - 7.8  Total Bilirubin 0.2 - 1.2 mg/dL - - 0.5  Alkaline Phos 39 - 117 U/L - - 72  AST 0 - 37 U/L - - 22  ALT 0 - 35 U/L - - 20    A/P:  60 yr old female POD#2 from Open Colostomy takedown, low pelvic anastomosis, extensive lysis of adhesions.  Pain: continue scheduled tylenol, toradol and flexeril with prn oxycodone and diluadid GI: Advance to full  liquid diet today, zofran and scopolomine for nausea, dry dressing to LLQ daily and prn for drainage GU: good UOP, still some blood tinge, voiding well Encourage ambulation in the hallway today, continue IS

## 2015-09-28 NOTE — Plan of Care (Signed)
Problem: Activity: Goal: Risk for activity intolerance will decrease Outcome: Progressing Pt ambulating in halls frequently.

## 2015-09-29 MED ORDER — POLYETHYLENE GLYCOL 3350 17 G PO PACK
17.0000 g | PACK | Freq: Every day | ORAL | Status: DC
  Administered 2015-09-29 – 2015-09-30 (×2): 17 g via ORAL
  Filled 2015-09-29 (×2): qty 1

## 2015-09-29 NOTE — Progress Notes (Signed)
60 yr old female POD#3 from Open Colostomy takedown, low pelvic anastomosis, extensive lysis of adhesions.  Patient states that her nausea has improved and she was able to eat some oatmeal this AM without difficulty.  She has been ambulating in hallways at least TID.  She is passing some flatus but no real BM yet.  Patient states good pain control with meds.   Filed Vitals:   09/29/15 0418 09/29/15 1155  BP: 104/47 113/56  Pulse: 87 80  Temp: 98.1 F (36.7 C) 97.6 F (36.4 C)  Resp: 20      I/O last 3 completed shifts: In: 3049.8 [P.O.:780; I.V.:2269.8] Out: 2350 [Urine:2350] Total I/O In: 240 [P.O.:240] Out: 350 [Urine:350]   PE:  Gen: NAD, sitting up in chair Abd: soft, appropriately tender, incision midline c/d/i,  LLQ incision clean, with serous drainage, penrose in place Ext: 2+pulses, no edema  CBC Latest Ref Rng 09/27/2015 09/18/2015 05/21/2015  WBC 3.6 - 11.0 K/uL 19.5(H) 5.5 4.6  Hemoglobin 12.0 - 16.0 g/dL 12.4 14.1 12.8  Hematocrit 35.0 - 47.0 % 35.4 40.8 38.5  Platelets 150 - 440 K/uL 231 269 307.0   CMP Latest Ref Rng 09/27/2015 09/18/2015 05/21/2015  Glucose 65 - 99 mg/dL 131(H) 89 96  BUN 6 - 20 mg/dL 17 15 14   Creatinine 0.44 - 1.00 mg/dL 1.01(H) 0.76 0.76  Sodium 135 - 145 mmol/L 140 140 143  Potassium 3.5 - 5.1 mmol/L 3.9 4.2 4.3  Chloride 101 - 111 mmol/L 110 105 108  CO2 22 - 32 mmol/L 24 27 29   Calcium 8.9 - 10.3 mg/dL 8.6(L) 9.4 10.0  Total Protein 6.0 - 8.3 g/dL - - 7.8  Total Bilirubin 0.2 - 1.2 mg/dL - - 0.5  Alkaline Phos 39 - 117 U/L - - 72  AST 0 - 37 U/L - - 22  ALT 0 - 35 U/L - - 20    A/P:  60 yr old female POD#3 from Open Colostomy takedown, low pelvic anastomosis, extensive lysis of adhesions.  Pain: continue scheduled tylenol, toradol and flexeril with prn oxycodone and diluadid GI: Advance to regular diet today, zofran and scopolomine for nausea, dry dressing to LLQ daily and prn for drainage GU: good UOP, still some blood tinge, voiding  well Encourage ambulation in the hallway today, continue IS

## 2015-09-30 MED ORDER — CYCLOBENZAPRINE HCL 5 MG PO TABS: 5.0000 mg | ORAL_TABLET | Freq: Three times a day (TID) | ORAL | Status: DC | PRN

## 2015-09-30 MED ORDER — OXYCODONE-ACETAMINOPHEN 5-325 MG PO TABS: 1.0000 | ORAL_TABLET | ORAL | Status: DC | PRN

## 2015-09-30 NOTE — Discharge Summary (Signed)
Physician Discharge Summary  Patient ID: Natalie [name] MRN: [id number] DOB/AGE: Feb 06, 1956 60 y.o.  Admit date: 09/26/2015 Discharge date: 09/30/2015  Admission Diagnoses:  DIverticulitis s/p colostomy  Discharge Diagnoses:  Active Problems:   Chronic constipation   Diverticulitis   Perforated viscus   Diverticulitis large intestine   Discharged Condition: good  Hospital Course: 60 yr old female with hx of perforated diverticulitis requiring hartman's 6 months prior.  Patient had Open colostomy takedown with LOA on 6/21 with Dr. Azalee Course.  Patient has done well.  She is up and moving round in hallway.  Pain is controlled with po pain medications.  She is tolerating regular diet and has had a BM.   Consults: None  Significant Diagnostic Studies: none  Treatments: surgery: Open colostomy takedown, LOA 6/21  Discharge Exam: Blood pressure 117/60, pulse 82, temperature 97.7 F (36.5 C), temperature source Oral, resp. rate 20, height 5' 4.5" (1.638 m), weight 134 lb (60.782 kg), SpO2 98 %. General appearance: alert, cooperative and no distress GI: soft, appropriately tender, midline incision c/d/i, LLQ site no erythema, serous drainage, penrose in place Extremities: extremities normal, atraumatic, no cyanosis or edema  Disposition: 01-Home or Self Care  Discharge Instructions    Call MD for:  persistant nausea and vomiting    Complete by:  As directed      Call MD for:  redness, tenderness, or signs of infection (pain, swelling, redness, odor or green/yellow discharge around incision site)    Complete by:  As directed      Call MD for:  severe uncontrolled pain    Complete by:  As directed      Call MD for:  temperature >100.4    Complete by:  As directed      Change dressing (specify)    Complete by:  As directed   Dressing change: 2 times per day using dry dressing.     Diet - low sodium heart healthy    Complete by:  As directed      Driving Restrictions    Complete  by:  As directed   No driving while on prescription pain medication     Increase activity slowly    Complete by:  As directed      Lifting restrictions    Complete by:  As directed   No lifting over 15lbs for 6 weeks     May shower / Bathe    Complete by:  As directed      May walk up steps    Complete by:  As directed             Medication List    TAKE these medications        cholecalciferol 1000 units tablet  Commonly known as:  VITAMIN D  Take 1,000 Units by mouth daily.     cyclobenzaprine 5 MG tablet  Commonly known as:  FLEXERIL  Take 1 tablet (5 mg total) by mouth 3 (three) times daily as needed for muscle spasms.     fluticasone 50 MCG/ACT nasal spray  Commonly known as:  FLONASE  Place 1 spray into both nostrils daily as needed for allergies or rhinitis. Reported on 03/27/2015     oxyCODONE-acetaminophen 5-325 MG tablet  Commonly known as:  ROXICET  Take 1-2 tablets by mouth every 4 (four) hours as needed for severe pain.     polyethylene glycol powder powder  Commonly known as:  GLYCOLAX/MIRALAX  Take 255 g by mouth once.  Follow-up Information    Call Hubbard Robinson, MD.   Specialty:  Surgery   Why:  f/u Open colostomy takedown, has penrose drain in place, will need to remove next week   Contact information:   [address] [place] [place] [address] [phone]       Signed: Hubbard Robinson 09/30/2015, 12:01 PM

## 2015-09-30 NOTE — Progress Notes (Signed)
Pt to be discharged per MD order. IV removed. Instructions reviewed with pt and family. Script given to pt. Taken out in wheelchair

## 2015-10-05 ENCOUNTER — Encounter: Payer: Self-pay | Admitting: Surgery

## 2015-10-05 ENCOUNTER — Ambulatory Visit (INDEPENDENT_AMBULATORY_CARE_PROVIDER_SITE_OTHER): Payer: Managed Care, Other (non HMO) | Admitting: Surgery

## 2015-10-05 ENCOUNTER — Other Ambulatory Visit: Payer: Self-pay

## 2015-10-05 VITALS — BP 109/73 | HR 106 | Temp 97.7°F | Ht 64.0 in | Wt 137.8 lb

## 2015-10-05 DIAGNOSIS — K5720 Diverticulitis of large intestine with perforation and abscess without bleeding: Secondary | ICD-10-CM

## 2015-10-05 MED ORDER — POLYETHYLENE GLYCOL POWD: 17.0000 g | Freq: Every day | Status: DC

## 2015-10-05 NOTE — Patient Instructions (Signed)
Please continue to take your Miralax for a goal of 1-2 soft bowel movements daily.  You may shower with soap and water as usual. The steri-strips will fall off on their own in 7-10 days.  We will have you follow-up in the [facility] clinic as scheduled below.  If you have any questions or concerns please call our office ahead of time.

## 2015-10-08 ENCOUNTER — Encounter: Payer: Self-pay | Admitting: Surgery

## 2015-10-08 NOTE — Progress Notes (Signed)
60 yr old female s/p open colostomy takedown with removal of the descending and part of transverse colon and reanastomosis. Patient is doing well at home. Patient states her appetite is getting better as well as her energy. Patient states that she has not had the nausea at this time like she did with her previous operation. Patient states that she is still using mainly ibuprofen for pain. Patient states there has been some drainage from the left side previous ostomy site. Patient denies any fever chills nausea vomiting or diarrhea. Patient has had normal soft bowel movements about twice a day and she is still taking the MiraLAX.  Filed Vitals:   10/05/15 1220  BP: 109/73  Pulse: 106  Temp: 97.7 F (36.5 C)   PE:  Gen: NAD Abd: soft, midline wound healing well, every other staple removed, LLQ incision site, penrose drain remove and staples as well, erythema around staples but not other part of wound bed Ext: 2+ pulses, no edema  A/P:  She had a colostomy reversal, chronic constipation seems to be resolved at this time. Discussed that she needs to keep a diet high in water and fiber to help have normal soft bowel movements daily and she is to continue the MiraLAX to help with this as well. She is to continue dry dressings to the left side of abdomen and was instructed that if she were to have increase in pain purulent drainage or any increase in redness along that area to give [place] a call. I'll have her return to clinic in 10 days to remove the rest of the staples and perform another wound check.

## 2015-10-18 ENCOUNTER — Ambulatory Visit (INDEPENDENT_AMBULATORY_CARE_PROVIDER_SITE_OTHER): Payer: Managed Care, Other (non HMO) | Admitting: Surgery

## 2015-10-18 ENCOUNTER — Encounter: Payer: Self-pay | Admitting: Surgery

## 2015-10-18 VITALS — BP 131/63 | HR 79 | Temp 98.3°F | Ht 64.0 in | Wt 138.0 lb

## 2015-10-18 DIAGNOSIS — K631 Perforation of intestine (nontraumatic): Secondary | ICD-10-CM

## 2015-10-18 DIAGNOSIS — K5732 Diverticulitis of large intestine without perforation or abscess without bleeding: Secondary | ICD-10-CM

## 2015-10-18 NOTE — Patient Instructions (Signed)
We will call and schedule you an appointment to see Dr.Loflin the end of September or First of October for a follow up. Please call our office if you have any questions or concerns.

## 2015-10-18 NOTE — Progress Notes (Signed)
60 yr old female status post descending colon and sigmoid colectomy with colostomy reversal. Patient has been doing well she states her energy is coming back slowly. Patient states that she does not have the shortness of breath that she had over the last operation. But she does state that she does have a little bit of bogginess of her brain, difficulty with memory. Patient is having good BMs about 2-3 daily that are soft and easy to pass she is not having any solid or hard stools. Otherwise the patient is doing well and hasn't had any fever chills nausea vomiting diarrhea or constipation.  Filed Vitals:   10/18/15 1329  BP: 131/63  Pulse: 79  Temp: 98.3 F (36.8 C)   PE:  Gen: NAD Abd: soft, midline incision c/e/di, statples removed today, LLQ incision site with good granuation tissue only a 1cm area left to heal in    A/P:  Overall patient is doing very well after a large operation. Patient no longer has her chronic constipation is having normal stools. Patient is to continue the dry dressing to the wound daily and can stop abdomen that once the area is scabbed over and healed. Patient is to return in 3 months for a check on how she is doing physically and to evaluate if we need to set up a colonoscopy.

## 2015-10-26 ENCOUNTER — Telehealth: Payer: Self-pay

## 2015-10-26 NOTE — Telephone Encounter (Signed)
Called patient and scheduled 3 month F/U appointment with Dr.Loflin on 01-17-16 @ 10:00am. Reminder sent.

## 2015-12-20 ENCOUNTER — Telehealth: Payer: Self-pay | Admitting: Gastroenterology

## 2015-12-20 NOTE — Telephone Encounter (Signed)
Patient has an appointment in Oct with Dr. Azalee Course and will be needing a colonoscopy before the end of the year. She has already met her deductible. Please call and schedule.

## 2015-12-25 ENCOUNTER — Other Ambulatory Visit: Payer: Self-pay

## 2015-12-25 NOTE — Telephone Encounter (Signed)
Gastroenterology Pre-Procedure Review  Request Date: 03/14/16 Requesting Physician: Dr. Azalee Course  PATIENT REVIEW QUESTIONS: The patient responded to the following health history questions as indicated:    1. Are you having any GI issues? no 2. Do you have a personal history of Polyps? no 3. Do you have a family history of Colon Cancer or Polyps? yes (maternal aunt colon cancer) 4. Diabetes Mellitus? no 5. Joint replacements in the past 12 months?no 6. Major health problems in the past 3 months?Yes, descending colon and sigmoid colectomy with colostomy reversal 09/26/15 7. Any artificial heart valves, MVP, or defibrillator?no    MEDICATIONS & ALLERGIES:    Patient reports the following regarding taking any anticoagulation/antiplatelet therapy:   Plavix, Coumadin, Eliquis, Xarelto, Lovenox, Pradaxa, Brilinta, or Effient? no Aspirin? no  Patient confirms/reports the following medications:  Current Outpatient Prescriptions  Medication Sig Dispense Refill  . cholecalciferol (VITAMIN D) 1000 units tablet Take 1,000 Units by mouth daily.    . fluticasone (FLONASE) 50 MCG/ACT nasal spray Place 1 spray into both nostrils daily as needed for allergies or rhinitis. Reported on 03/27/2015    . Polyethylene Glycol POWD Take 17 g by mouth daily. 1000 g 5   No current facility-administered medications for this visit.     Patient confirms/reports the following allergies:  Allergies  Allergen Reactions  . Codeine Nausea Only  . Levofloxacin Other (See Comments)    insomnia  . Sulfa Antibiotics Rash    No orders of the defined types were placed in this encounter.   AUTHORIZATION INFORMATION Primary Insurance: 1D#: Group #:  Secondary Insurance: 1D#: Group #:  SCHEDULE INFORMATION: Date: 03/14/2016 Time: Location: [facility]

## 2015-12-25 NOTE — Telephone Encounter (Signed)
Patient returned your call to schedule colonoscopy. She was at the dentist when you called earlier. Please call back at your convenience. Thank you.

## 2015-12-25 NOTE — Telephone Encounter (Signed)
Colonoscopy: Diverticulitis K57 [facility] [id number] [employer] Pre cert is not required

## 2015-12-25 NOTE — Telephone Encounter (Signed)
Called patient and left a voicemail 

## 2016-01-17 ENCOUNTER — Ambulatory Visit: Payer: Self-pay | Admitting: Surgery

## 2016-01-29 ENCOUNTER — Ambulatory Visit (INDEPENDENT_AMBULATORY_CARE_PROVIDER_SITE_OTHER): Payer: Managed Care, Other (non HMO) | Admitting: Surgery

## 2016-01-29 ENCOUNTER — Encounter: Payer: Self-pay | Admitting: Surgery

## 2016-01-29 VITALS — BP 123/76 | HR 61 | Temp 97.8°F | Ht 64.0 in | Wt 143.0 lb

## 2016-01-29 DIAGNOSIS — K5732 Diverticulitis of large intestine without perforation or abscess without bleeding: Secondary | ICD-10-CM | POA: Diagnosis not present

## 2016-01-29 DIAGNOSIS — K631 Perforation of intestine (nontraumatic): Secondary | ICD-10-CM

## 2016-01-29 NOTE — Patient Instructions (Addendum)
Please call our office if you have any questions or concerns. Your Colonoscopy is scheduled for March 14, 2016.

## 2016-01-29 NOTE — Progress Notes (Signed)
Outpatient Surgical Follow Up  01/29/2016  Natalie [name] is an 60 y.o. female seen for the diagnosis of Perforated bowel ([place]) [K63.1].  HPI: Patient seen and examined in clinic. She is 4 months out from having a colostomy reversal open on 6/21.Marland Kitchen Overall she is doing well with no acute complaints.  She does still have some complaints of bloating particularly just prior to having bowel movements. She did have a problem with chronic constipation and has been on the MiraLAX once daily. The patient is having soft stools that she can pass fairly easily but has been still taking the MiraLAX. She has not needed any other medications to help with this and occasionally will have a bowel movement twice a day. She is drinking plenty of water daily as well. She has gotten her energy back strength back and now feels expected normal.  Past Medical History:  Diagnosis Date  . Benign neoplasm of sigmoid colon   . Chronic constipation   . Chronic sinusitis   . Cystitis   . Diverticulitis   . Perforated bowel ([place])   . Pleural effusion on right 2016    Past Surgical History:  Procedure Laterality Date  . COLON SURGERY    . COLONOSCOPY WITH PROPOFOL N/A 07/27/2015   Procedure: COLONOSCOPY WITH PROPOFOL;  Surgeon: Lucilla Lame, MD;  Location: [facility];  Service: Endoscopy;  Laterality: N/A;  . COLOSTOMY REVERSAL N/A 09/26/2015   Procedure: COLOSTOMY REVERSAL / TAKE DOWN;  Surgeon: Hubbard Robinson, MD;  Location: [facility];  Service: General;  Laterality: N/A;  . LAPAROTOMY N/A 03/01/2015   Procedure: EXPLORATORY LAPAROTOMY, SIGMOID COLECTOMY, COLOSTOMY;  Surgeon: Hubbard Robinson, MD;  Location: [facility];  Service: General;  Laterality: N/A;    Family History  Problem Relation Age of Onset  . Stroke Mother   . Arthritis Father   . Lung cancer Father   . Colon cancer Maternal Aunt   . Breast cancer Paternal Aunt     Social History:  reports that she has never smoked. She has never  used smokeless tobacco. She reports that she does not drink alcohol or use drugs.  Allergies:  Allergies  Allergen Reactions  . Codeine Nausea Only  . Levofloxacin Other (See Comments)    insomnia  . Sulfa Antibiotics Rash    Medications reviewed.  Review of Systems  Constitutional: Negative for chills, fever, malaise/fatigue and weight loss.  HENT: Negative for congestion.   Respiratory: Negative for cough, sputum production, shortness of breath and wheezing.   Cardiovascular: Negative for chest pain and leg swelling.  Gastrointestinal: Positive for constipation. Negative for abdominal pain, blood in stool, heartburn, melena and nausea.  Genitourinary: Negative for dysuria, frequency and hematuria.  Musculoskeletal: Negative for back pain.  Skin: Negative for itching and rash.  Neurological: Negative for dizziness, loss of consciousness and weakness.  Psychiatric/Behavioral: The patient is not nervous/anxious.   All other systems reviewed and are negative.    Physical Exam:  BP 123/76   Pulse 61   Temp 97.8 F (36.6 C) (Oral)   Ht 5\' 4"  (1.626 m)   Wt 143 lb (64.9 kg)   BMI 24.55 kg/m   Physical Exam  Constitutional: She is oriented to person, place, and time. She appears well-developed and well-nourished.  HENT:  Head: Normocephalic and atraumatic.  Right Ear: External ear normal.  Left Ear: External ear normal.  Nose: Nose normal.  Mouth/Throat: Oropharynx is clear and moist.  Eyes: EOM are normal. Pupils  are equal, round, and reactive to light. Right eye exhibits no discharge. Left eye exhibits no discharge. No scleral icterus.  Neck: Normal range of motion. No tracheal deviation present.  Cardiovascular: Normal rate, regular rhythm and intact distal pulses.   Pulmonary/Chest: Effort normal and breath sounds normal. No respiratory distress. She has no wheezes.  Abdominal: Soft. Bowel sounds are normal. She exhibits no distension. There is no tenderness. There is  no guarding.  Well healed midline incision and well healed ostomy site scar  Musculoskeletal: Normal range of motion. She exhibits no edema or deformity.  Neurological: She is alert and oriented to person, place, and time. No cranial nerve deficit.  Skin: Skin is warm and dry. No rash noted. No erythema. No pallor.  Psychiatric: She has a normal mood and affect. Her behavior is normal. Judgment and thought content normal.  Vitals reviewed.    Assessment/Plan: Natalie [name] is an 60 y.o. female seen for the diagnosis of Perforated bowel ([place]) [K63.1].   She is overall all doing very well after colostomy reversal. She did have chronic constipation prior to the operation and had been on Linzess previously. She has been maintaining overall good constipation management just being on MiraLAX once daily.  He is still having some symptoms of bloating and this particularly is happening just prior to a bowel movement. She does state that occasionally she is having to strain with her bowel movements now as well.   I am unsure if this represents a potential stricturing at her anastomotic site versus some return of her chronic constipation type symptoms. I have referred her to Dr. Allen Norris who does have a colonoscopy scheduled for her on 12/8. I will ask him to make some recommendations on her chronic constipation as well and have her follow-up with me if there are any issues I need to address.   Catherine L. Loflin MD General Surgeon  01/29/2016,1:36 PM

## 2016-03-10 ENCOUNTER — Encounter: Payer: Self-pay | Admitting: *Deleted

## 2016-03-13 NOTE — Discharge Instructions (Signed)

## 2016-03-14 ENCOUNTER — Ambulatory Visit: Payer: Managed Care, Other (non HMO) | Admitting: Anesthesiology

## 2016-03-14 ENCOUNTER — Encounter
Admission: RE | Disposition: A | Discharge status: Home / Self Care | Payer: Self-pay | Source: Ambulatory Visit | Attending: Gastroenterology

## 2016-03-14 ENCOUNTER — Ambulatory Visit
Admission: RE | Admit: 2016-03-14 | Discharge: 2016-03-14 | Disposition: A | Discharge status: Home / Self Care | Payer: Managed Care, Other (non HMO) | Source: Ambulatory Visit | Attending: Gastroenterology | Admitting: Gastroenterology

## 2016-03-14 DIAGNOSIS — K5732 Diverticulitis of large intestine without perforation or abscess without bleeding: Secondary | ICD-10-CM | POA: Diagnosis not present

## 2016-03-14 DIAGNOSIS — K5730 Diverticulosis of large intestine without perforation or abscess without bleeding: Secondary | ICD-10-CM | POA: Diagnosis present

## 2016-03-14 DIAGNOSIS — D122 Benign neoplasm of ascending colon: Secondary | ICD-10-CM | POA: Diagnosis not present

## 2016-03-14 DIAGNOSIS — Z980 Intestinal bypass and anastomosis status: Secondary | ICD-10-CM

## 2016-03-14 DIAGNOSIS — Z9889 Other specified postprocedural states: Secondary | ICD-10-CM

## 2016-03-14 DIAGNOSIS — Z8719 Personal history of other diseases of the digestive system: Secondary | ICD-10-CM

## 2016-03-14 DIAGNOSIS — K641 Second degree hemorrhoids: Secondary | ICD-10-CM | POA: Insufficient documentation

## 2016-03-14 HISTORY — PX: POLYPECTOMY: SHX5525

## 2016-03-14 HISTORY — PX: COLONOSCOPY WITH PROPOFOL: SHX5780

## 2016-03-14 SURGERY — COLONOSCOPY WITH PROPOFOL
Anesthesia: General | Wound class: Contaminated

## 2016-03-14 MED ORDER — PROPOFOL 10 MG/ML IV BOLUS
INTRAVENOUS | Status: DC | PRN
  Administered 2016-03-14 (×3): 50 mg via INTRAVENOUS

## 2016-03-14 MED ORDER — LIDOCAINE HCL (CARDIAC) 20 MG/ML IV SOLN
INTRAVENOUS | Status: DC | PRN
  Administered 2016-03-14: 50 mg via INTRAVENOUS

## 2016-03-14 MED ORDER — ACETAMINOPHEN 160 MG/5ML PO SOLN
325.0000 mg | ORAL | Status: DC | PRN
Start: 2016-03-14 — End: 2016-03-14

## 2016-03-14 MED ORDER — LACTATED RINGERS IV SOLN
INTRAVENOUS | Status: DC | PRN
  Administered 2016-03-14: 09:00:00 via INTRAVENOUS

## 2016-03-14 MED ORDER — LACTATED RINGERS IV SOLN: INTRAVENOUS | Status: DC

## 2016-03-14 MED ORDER — STERILE WATER FOR IRRIGATION IR SOLN
Status: DC | PRN
  Administered 2016-03-14: 10:00:00

## 2016-03-14 MED ORDER — ACETAMINOPHEN 325 MG PO TABS
325.0000 mg | ORAL_TABLET | ORAL | Status: DC | PRN
Start: 2016-03-14 — End: 2016-03-14

## 2016-03-14 SURGICAL SUPPLY — 23 items

## 2016-03-14 NOTE — Anesthesia Postprocedure Evaluation (Signed)
Anesthesia Post Note  Patient: Natalie [name]  Procedure(s) Performed: Procedure(s) (LRB): COLONOSCOPY WITH PROPOFOL (N/A) POLYPECTOMY  Patient location during evaluation: PACU Anesthesia Type: MAC Level of consciousness: awake and alert and oriented Pain management: satisfactory to patient Vital Signs Assessment: post-procedure vital signs reviewed and stable Respiratory status: spontaneous breathing, nonlabored ventilation and respiratory function stable Cardiovascular status: blood pressure returned to baseline and stable Postop Assessment: Adequate PO intake and No signs of nausea or vomiting Anesthetic complications: no    Raliegh Ip

## 2016-03-14 NOTE — Anesthesia Preprocedure Evaluation (Signed)
Anesthesia Evaluation  Patient identified by MRN, date of birth, ID band Patient awake    Reviewed: Allergy & Precautions, H&P , NPO status , Patient's Chart, lab work & pertinent test results  Airway Mallampati: II  TM Distance: >3 FB Neck ROM: full    Dental no notable dental hx.    Pulmonary    Pulmonary exam normal        Cardiovascular Normal cardiovascular exam     Neuro/Psych    GI/Hepatic   Endo/Other    Renal/GU      Musculoskeletal   Abdominal   Peds  Hematology   Anesthesia Other Findings   Reproductive/Obstetrics                             Anesthesia Physical Anesthesia Plan  ASA: II  Anesthesia Plan:    Post-op Pain Management:    Induction:   Airway Management Planned:   Additional Equipment:   Intra-op Plan:   Post-operative Plan:   Informed Consent: I have reviewed the patients History and Physical, chart, labs and discussed the procedure including the risks, benefits and alternatives for the proposed anesthesia with the patient or authorized representative who has indicated his/her understanding and acceptance.     Plan Discussed with:   Anesthesia Plan Comments:         Anesthesia Quick Evaluation

## 2016-03-14 NOTE — Transfer of Care (Signed)
Immediate Anesthesia Transfer of Care Note  Patient: Natalie [name]  Procedure(s) Performed: Procedure(s) with comments: COLONOSCOPY WITH PROPOFOL (N/A) - Cannot arrive before 0830 AM POLYPECTOMY  Patient Location: PACU  Anesthesia Type: No value filed.  Level of Consciousness: awake, alert  and patient cooperative  Airway and Oxygen Therapy: Patient Spontanous Breathing and Patient connected to supplemental oxygen  Post-op Assessment: Post-op Vital signs reviewed, Patient's Cardiovascular Status Stable, Respiratory Function Stable, Patent Airway and No signs of Nausea or vomiting  Post-op Vital Signs: Reviewed and stable  Complications: No apparent anesthesia complications

## 2016-03-14 NOTE — H&P (Signed)
Lucilla Lame, MD [facility] [address], [place], [place] [address] Phone: [phone] Fax : [phone]  Primary Care Physician:  Sheral Flow, NP Primary Gastroenterologist:  Dr. Allen Norris  Pre-Procedure History & Physical: HPI:  Natalie [name] is a 60 y.o. female is here for an colonoscopy.   Past Medical History:  Diagnosis Date  . Benign neoplasm of sigmoid colon   . Chronic constipation   . Chronic sinusitis   . Cystitis   . Diverticulitis   . Perforated bowel ([place])   . Pleural effusion on right 2016    Past Surgical History:  Procedure Laterality Date  . COLON SURGERY    . COLONOSCOPY WITH PROPOFOL N/A 07/27/2015   Procedure: COLONOSCOPY WITH PROPOFOL;  Surgeon: Lucilla Lame, MD;  Location: [place];  Service: Endoscopy;  Laterality: N/A;  . COLOSTOMY REVERSAL N/A 09/26/2015   Procedure: COLOSTOMY REVERSAL / TAKE DOWN;  Surgeon: Hubbard Robinson, MD;  Location: [facility];  Service: General;  Laterality: N/A;  . LAPAROTOMY N/A 03/01/2015   Procedure: EXPLORATORY LAPAROTOMY, SIGMOID COLECTOMY, COLOSTOMY;  Surgeon: Hubbard Robinson, MD;  Location: [facility];  Service: General;  Laterality: N/A;    Prior to Admission medications   Medication Sig Start Date End Date Taking? Authorizing Provider  Multiple Vitamin (MULTIVITAMIN) tablet Take 1 tablet by mouth daily.   Yes Historical Provider, MD  cholecalciferol (VITAMIN D) 1000 units tablet Take 1,000 Units by mouth daily.    Historical Provider, MD  fluticasone (FLONASE) 50 MCG/ACT nasal spray Place 1 spray into both nostrils daily as needed for allergies or rhinitis. Reported on 03/27/2015    Historical Provider, MD  Polyethylene Glycol POWD Take 17 g by mouth daily. 10/05/15   Hubbard Robinson, MD    Allergies as of 12/25/2015 - Review Complete 12/25/2015  Allergen Reaction Noted  . Codeine Nausea Only 12/20/2014  . Levofloxacin Other (See Comments) 03/22/2015  . Sulfa antibiotics Rash  12/20/2014    Family History  Problem Relation Age of Onset  . Stroke Mother   . Arthritis Father   . Lung cancer Father   . Colon cancer Maternal Aunt   . Breast cancer Paternal Aunt     Social History   Social History  . Marital status: Married    Spouse name: N/A  . Number of children: N/A  . Years of education: N/A   Occupational History  . Not on file.   Social History Main Topics  . Smoking status: Never Smoker  . Smokeless tobacco: Never Used  . Alcohol use No  . Drug use: No  . Sexual activity: Not on file   Other Topics Concern  . Not on file   Social History Narrative   Married.   Works for [employer] in [place].   Is a Equities trader.   Enjoys spending time with family, reading, walking. She is raising her granddaughter.       Review of Systems: See HPI, otherwise negative ROS  Physical Exam: BP 117/67   Pulse 84   Temp 98.6 F (37 C) (Tympanic)   Resp 18   Ht 5\' 5"  (1.651 m)   Wt 138 lb (62.6 kg)   SpO2 100%   BMI 22.96 kg/m  General:   Alert,  pleasant and cooperative in NAD Head:  Normocephalic and atraumatic. Neck:  Supple; no masses or thyromegaly. Lungs:  Clear throughout to auscultation.    Heart:  Regular rate and rhythm. Abdomen:  Soft, nontender  and nondistended. Normal bowel sounds, without guarding, and without rebound.   Neurologic:  Alert and  oriented x4;  grossly normal neurologically.  Impression/Plan: [name] is here for an colonoscopy to be performed for follow up for diverticulitis  Risks, benefits, limitations, and alternatives regarding  colonoscopy have been reviewed with the patient.  Questions have been answered.  All parties agreeable.   Lucilla Lame, MD  03/14/2016, 9:39 AM

## 2016-03-14 NOTE — Anesthesia Procedure Notes (Signed)
Procedure Name: MAC Performed by: Neya Creegan Pre-anesthesia Checklist: Patient identified, Emergency Drugs available, Suction available, Timeout performed and Patient being monitored Patient Re-evaluated:Patient Re-evaluated prior to inductionOxygen Delivery Method: Nasal cannula Placement Confirmation: positive ETCO2     

## 2016-03-14 NOTE — Op Note (Signed)
[facility] Patient Name: Natalie [name] Procedure Date: 03/14/2016 9:38 AM MRN: [id number] Account #: [id number] Date of Birth: 1956-03-25 Admit Type: Outpatient Age: 60 Room: [facility] OR ROOM 01 Gender: Female Note Status: Finalized Procedure:            Colonoscopy Indications:          Follow-up of diverticulitis Providers:            Lucilla Lame MD, MD Referring MD:         Pleas Koch (Referring MD) Medicines:            Propofol per Anesthesia Complications:        No immediate complications. Procedure:            Pre-Anesthesia Assessment:                       - Prior to the procedure, a History and Physical was                        performed, and patient medications and allergies were                        reviewed. The patient's tolerance of previous                        anesthesia was also reviewed. The risks and benefits of                        the procedure and the sedation options and risks were                        discussed with the patient. All questions were                        answered, and informed consent was obtained. Prior                        Anticoagulants: The patient has taken no previous                        anticoagulant or antiplatelet agents. ASA Grade                        Assessment: II - A patient with mild systemic disease.                        After reviewing the risks and benefits, the patient was                        deemed in satisfactory condition to undergo the                        procedure.                       After obtaining informed consent, the colonoscope was                        passed under direct vision. Throughout the procedure,  the patient's blood pressure, pulse, and oxygen                        saturations were monitored continuously. The Olympus CF                        H180AL colonoscope (S#: [id number]) was introduced through              the anus and advanced to the the cecum, identified by                        appendiceal orifice and ileocecal valve. The                        colonoscopy was performed without difficulty. The                        patient tolerated the procedure well. The quality of                        the bowel preparation was good. Findings:      The perianal and digital rectal examinations were normal.      A 2 mm polyp was found in the ascending colon. The polyp was sessile.       The polyp was removed with a cold biopsy forceps. Resection and       retrieval were complete.      There was evidence of a prior end-to-side colo-colonic anastomosis in       the sigmoid colon. This was patent and was characterized by healthy       appearing mucosa. The anastomosis was traversed.      Non-bleeding internal hemorrhoids were found during retroflexion. The       hemorrhoids were Grade II (internal hemorrhoids that prolapse but reduce       spontaneously). Impression:           - One 2 mm polyp in the ascending colon, removed with a                        cold biopsy forceps. Resected and retrieved.                       - Patent end-to-side colo-colonic anastomosis,                        characterized by healthy appearing mucosa.                       - Non-bleeding internal hemorrhoids. Recommendation:       - Discharge patient to home.                       - Resume previous diet.                       - Continue present medications.                       - Repeat colonoscopy in 5 years if polyp adenoma and 10                        years  if hyperplastic Procedure Code(s):    --- Professional ---                       [phone], Colonoscopy, flexible; with biopsy, single or                        multiple Diagnosis Code(s):    --- Professional ---                       K57.32, Diverticulitis of large intestine without                        perforation or abscess without bleeding                        D12.2, Benign neoplasm of ascending colon                       Z98.0, Intestinal bypass and anastomosis status CPT copyright 2016 American Medical Association. All rights reserved. The codes documented in this report are preliminary and upon coder review may  be revised to meet current compliance requirements. Lucilla Lame MD, MD 03/14/2016 10:00:44 AM This report has been signed electronically. Number of Addenda: 0 Note Initiated On: 03/14/2016 9:38 AM Scope Withdrawal Time: 0 hours 5 minutes 42 seconds  Total Procedure Duration: 0 hours 6 minutes 11 seconds       [facility]

## 2016-03-17 ENCOUNTER — Encounter: Payer: Self-pay | Admitting: Gastroenterology

## 2016-03-18 ENCOUNTER — Encounter: Payer: Self-pay | Admitting: Gastroenterology

## 2016-03-20 ENCOUNTER — Encounter: Payer: Self-pay | Admitting: Gastroenterology

## 2016-06-22 IMAGING — CR DG CHEST 1V PORT
1 series · 1 of 1 positions shown · non-contrast
Comparison: CT scan of the chest and chest x-ray March 12, 2015

CLINICAL DATA: Right pleural effusion

EXAM:
PORTABLE CHEST 1 VIEW

[portable]
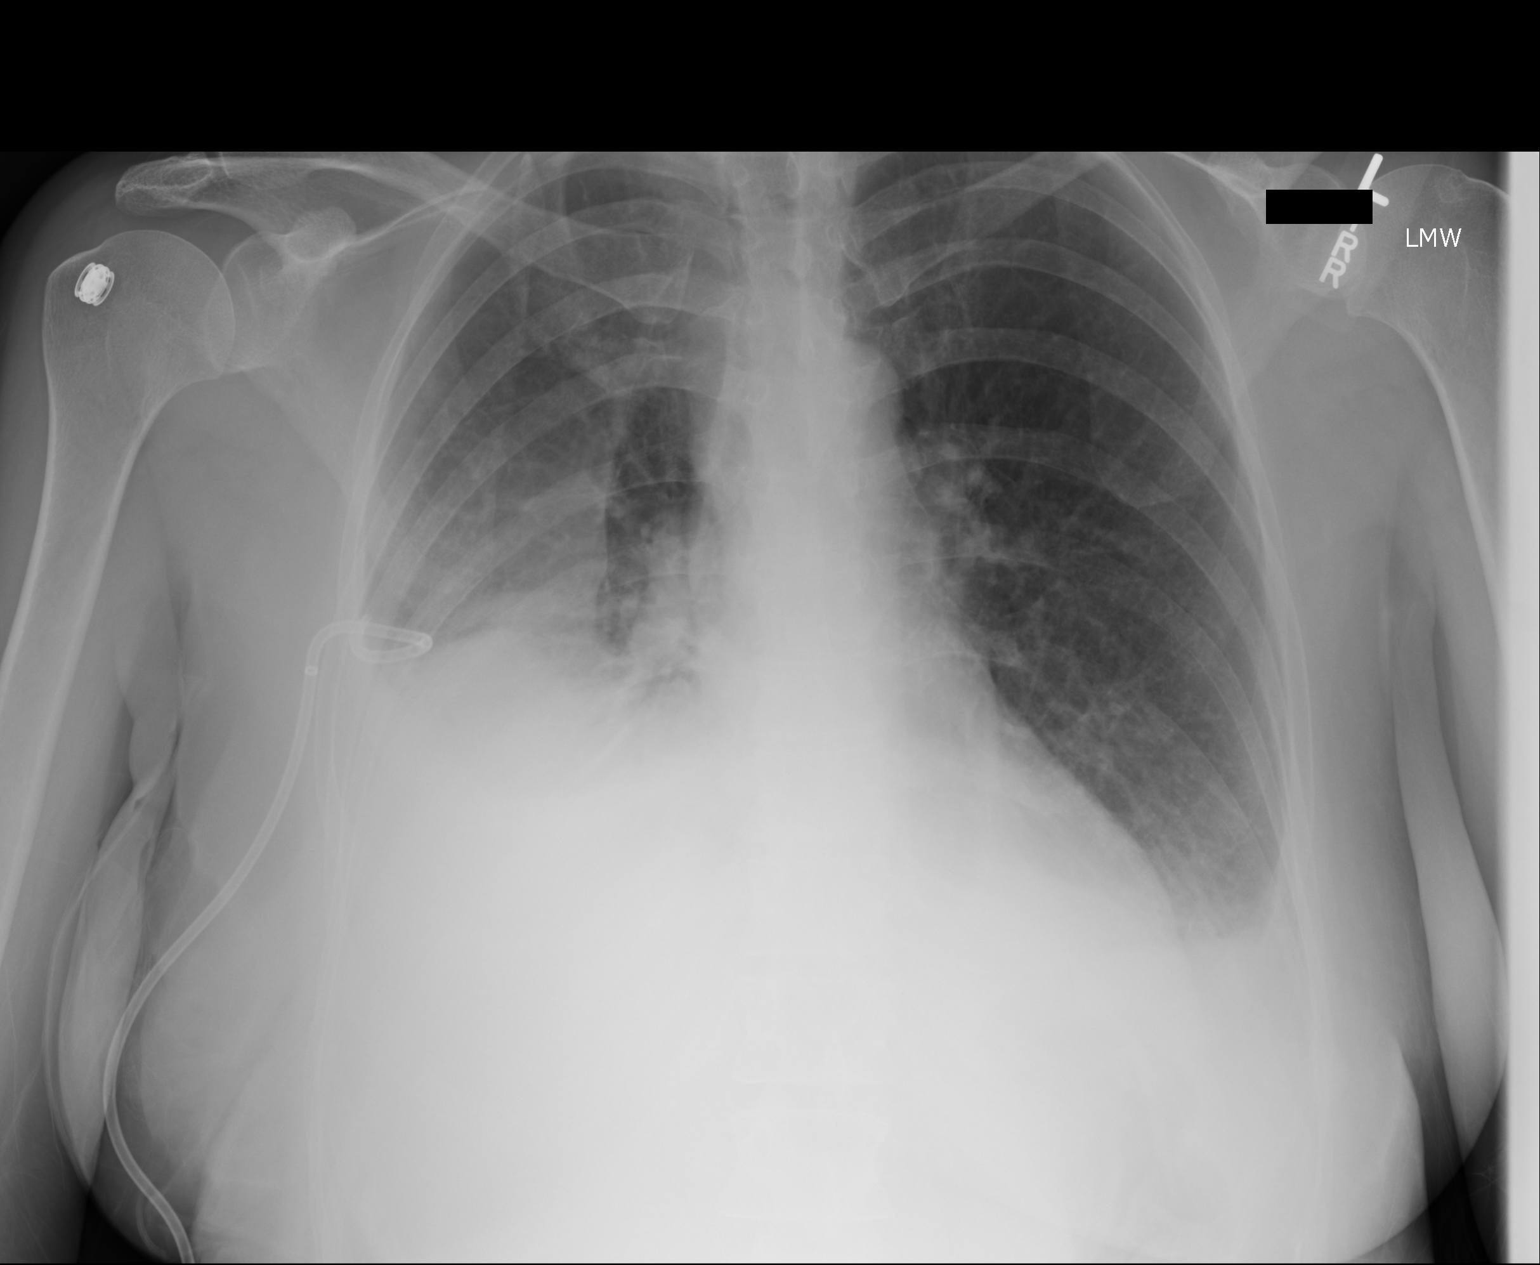

[1 of 1 positions shown; findings below may reference images not displayed]

FINDINGS: There remains a moderate size right pleural effusion. The small
caliber right-sided chest tube is unchanged in position. There is no
pneumothorax. The left lung is well-expanded. There is a small left
pleural effusion. The pulmonary vascularity is not engorged. The
left heart border is normal.
IMPRESSION: Allowing for slight differences in positioning there has not been
significant interval change in the appearance of the chest. There
remains a moderate size right pleural effusion with small left
pleural effusion. Known atelectatic right lower lobe is present from
the earlier CT scan.

## 2016-06-26 IMAGING — CR DG CHEST 2V
2 series · 2 of 2 positions shown · non-contrast
Comparison: March 16, 2015

CLINICAL DATA: Pleural effusion.  Shortness of breath

EXAM:
CHEST  2 VIEW

[chest pa]
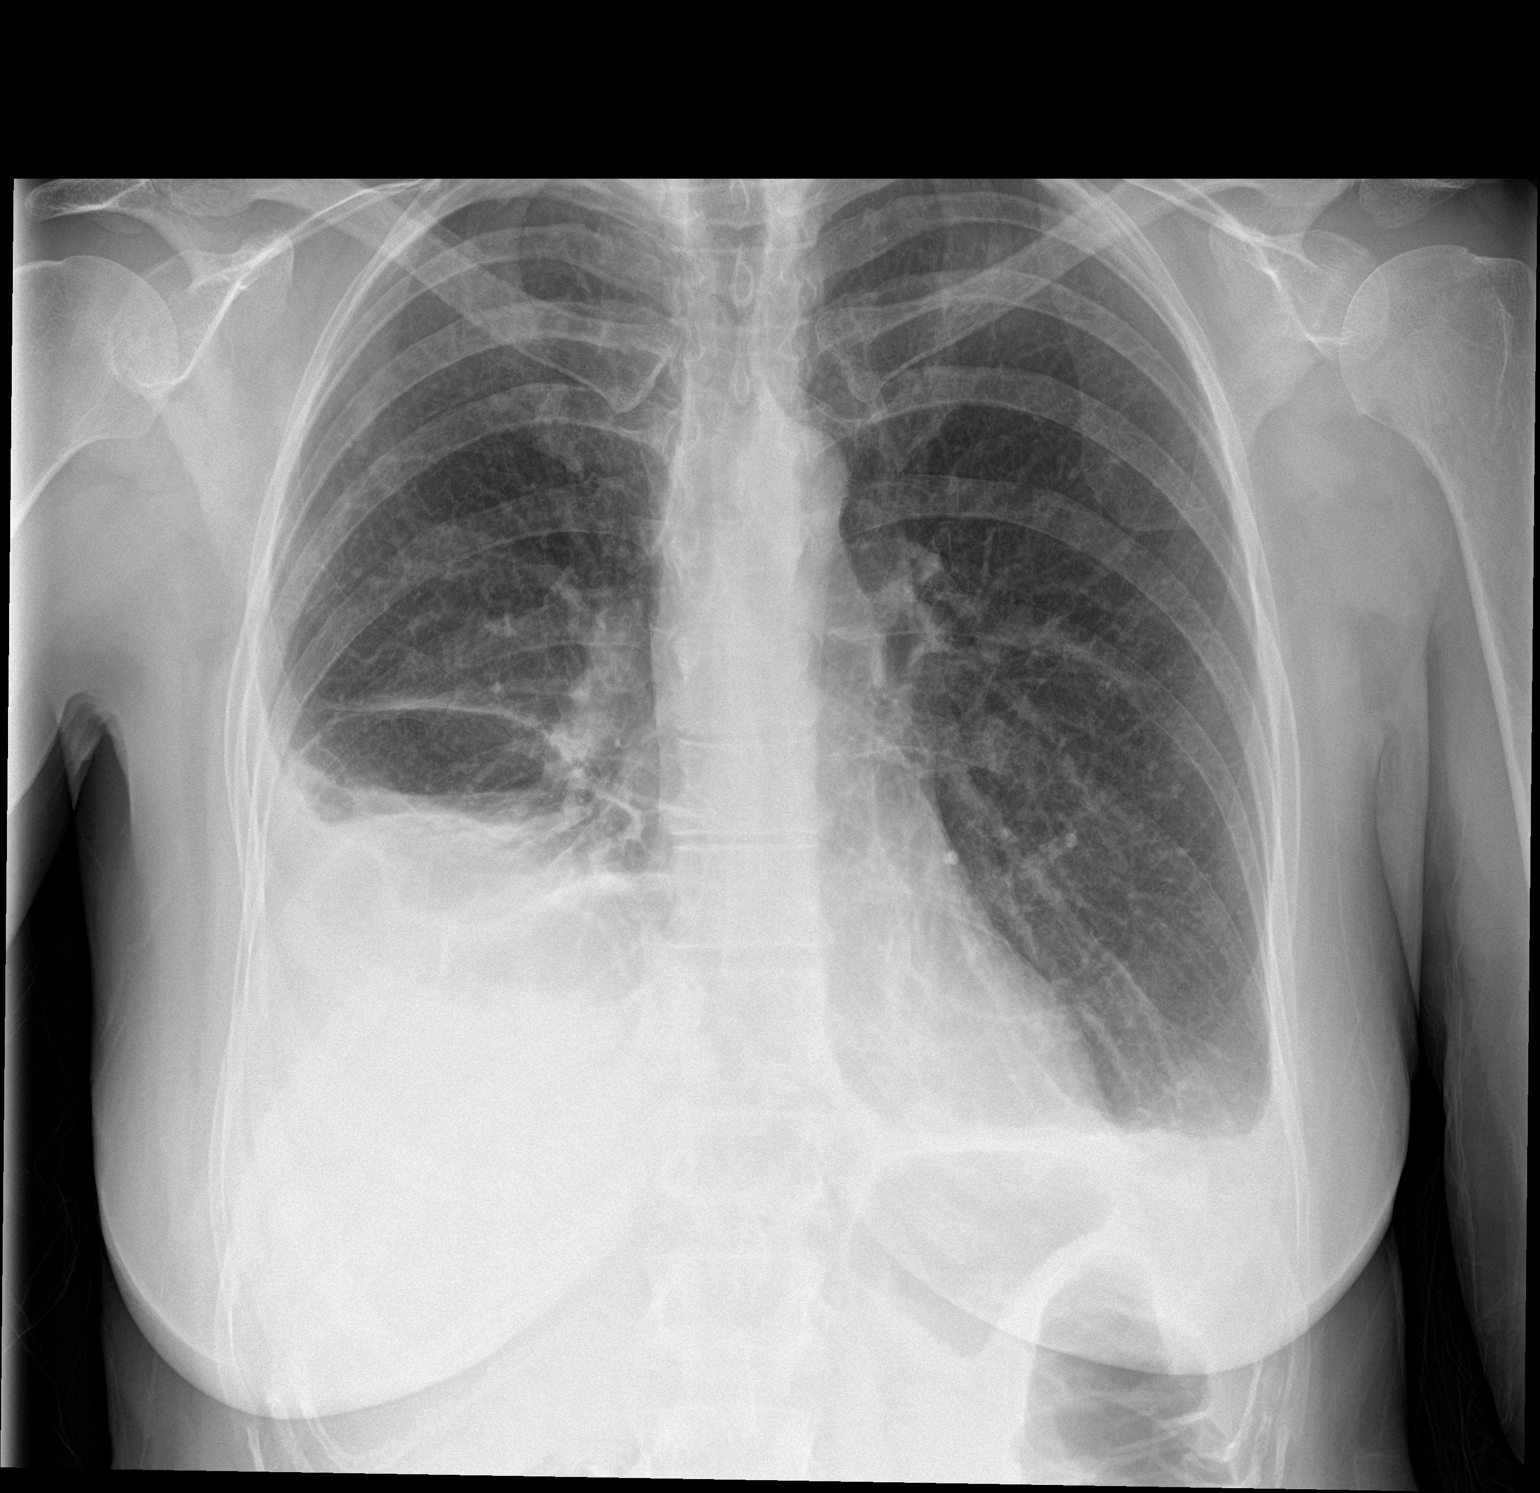

[chest lat]
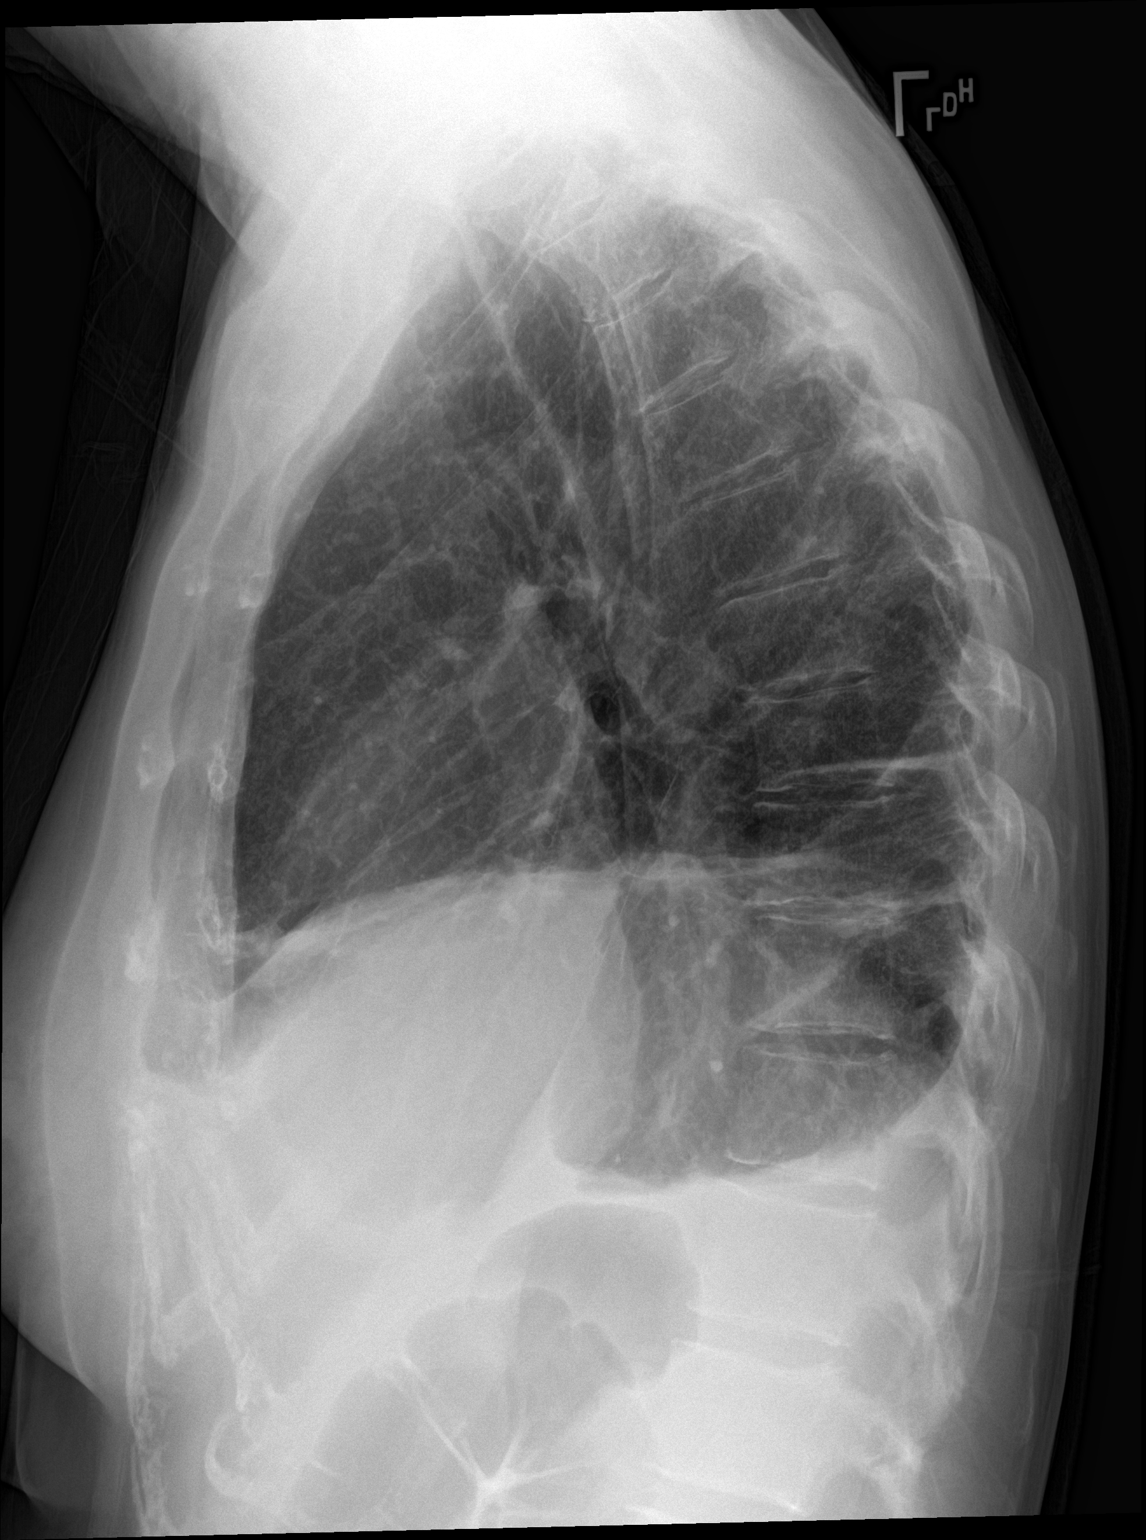

[2 of 2 positions shown; findings below may reference images not displayed]

FINDINGS: Right-sided pigtail catheter no longer appreciable. There is no
appreciable pneumothorax. There is a right pleural effusion with
right base atelectasis/consolidation. There is a small left effusion
with mild left base atelectasis. Lungs elsewhere clear. Heart size
and pulmonary vascularity are normal. No adenopathy.
IMPRESSION: Bilateral effusions, larger on the right than on the left. Patchy
atelectasis in both lower lungs, more on the right than on the left.
There may be some mild consolidation in the right base is well.
Heart size within normal limits.

## 2016-07-03 IMAGING — CR DG CHEST 2V
1 series · 2 of 2 positions shown · non-contrast
Comparison: CT chest and PA and lateral chest 03/20/2015.

CLINICAL DATA: Shortness of breath, dyspnea and nausea for the past
week and a half with intermittent abdominal pain. Subsequent
encounter.

EXAM:
CHEST  2 VIEW

[Series 1: dg chest 2 view · 0.14mm/px · 2 of 2 slices shown]
[im 1/2]
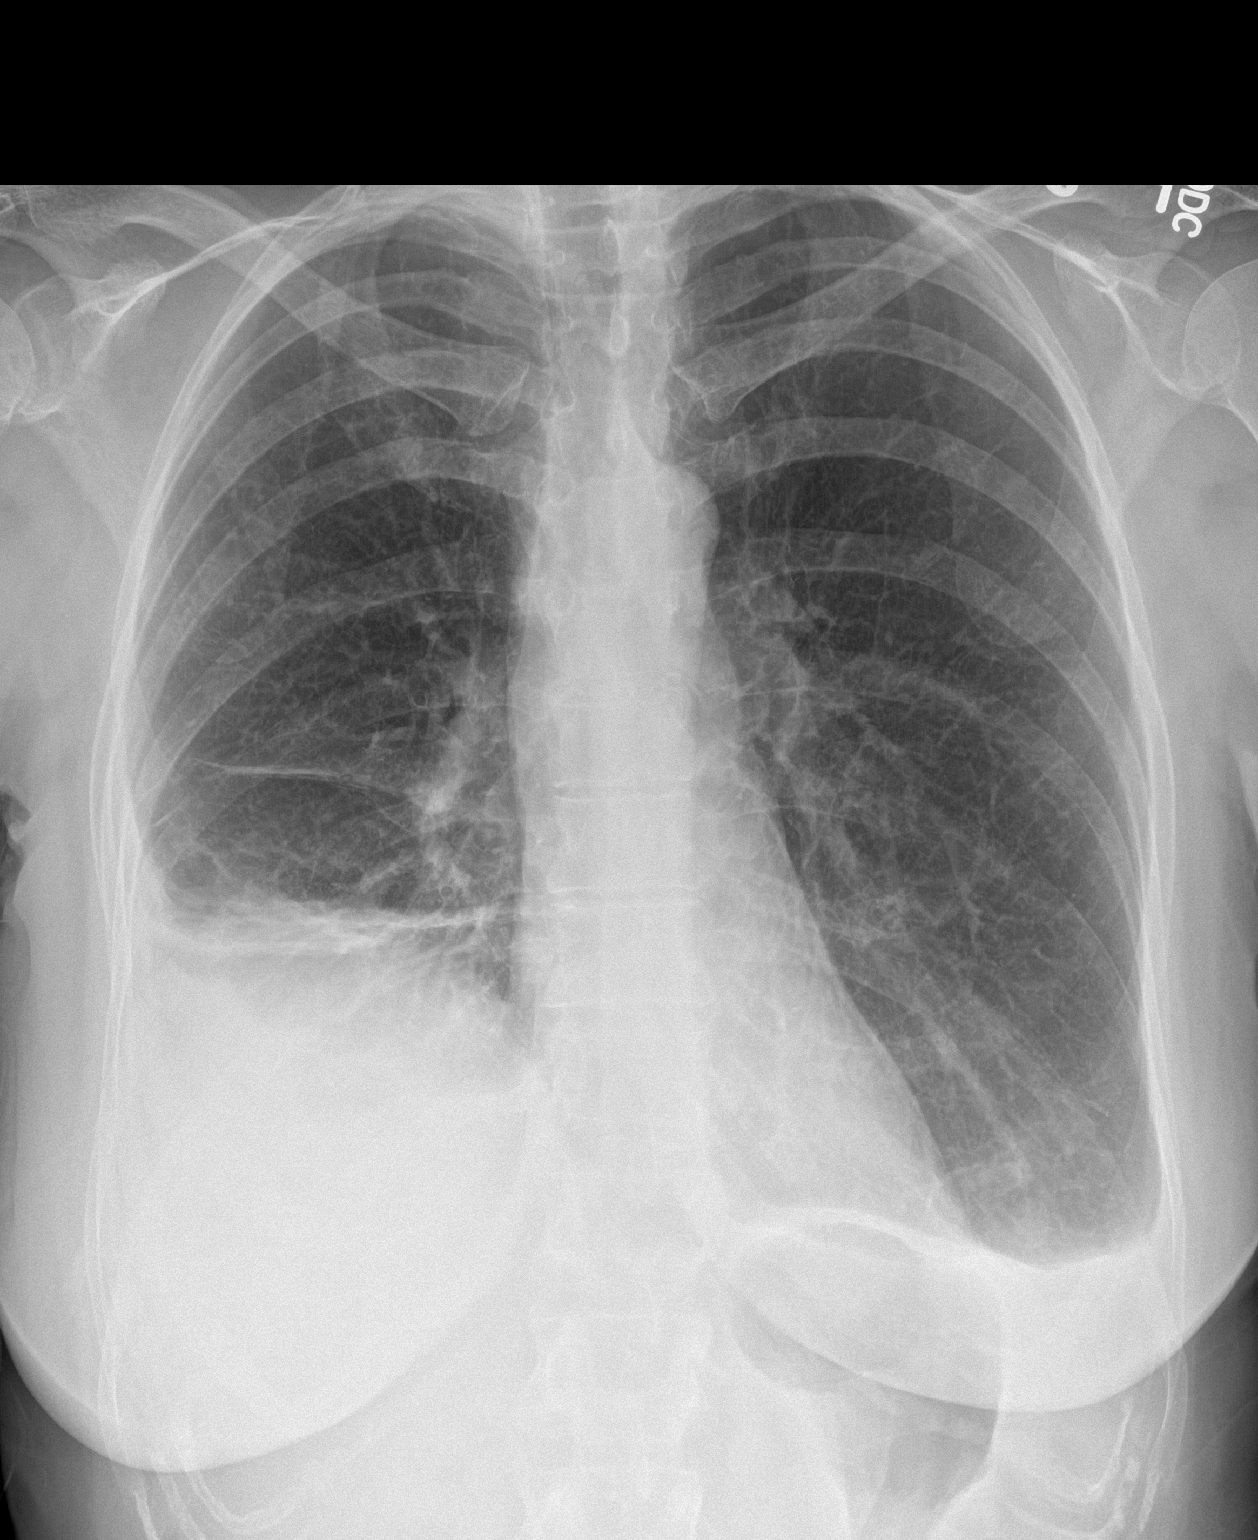
[im 2/2]
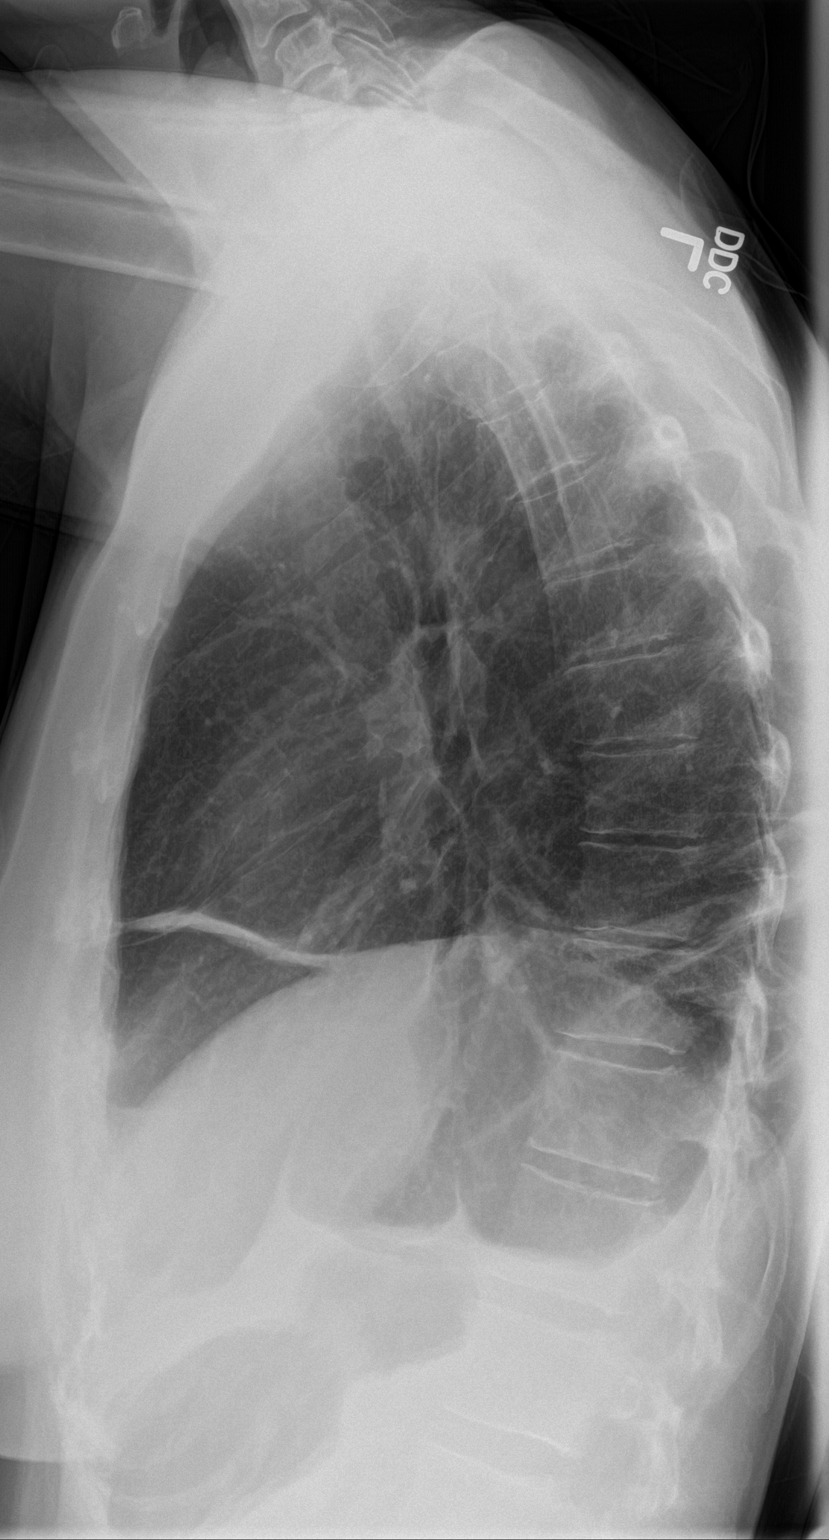

[2 of 2 positions shown; findings below may reference images not displayed]

FINDINGS: Elevation of the right hemidiaphragm relative to the left is
unchanged. Small bilateral pleural effusions and basilar atelectasis
persist. No pneumothorax is identified. Heart size is normal.
IMPRESSION: No change in small bilateral pleural effusions and basilar
atelectasis.

## 2016-08-26 ENCOUNTER — Telehealth: Payer: Self-pay | Admitting: Primary Care

## 2016-08-26 NOTE — Telephone Encounter (Signed)
Pt has appt to see Allie Bossier NP on 08/28/16 at 7:15.

## 2016-08-26 NOTE — Telephone Encounter (Signed)
Noted  

## 2016-08-26 NOTE — Telephone Encounter (Signed)
[facility] Patient Name: MCCALL WILL DOB: [birth year] Initial Comment Caller states she has had symptoms of high blood sugar. She has had a fuzziness in her head. While driving she feels nauseous. She doesn't feel as alert. Nurse Assessment Nurse: Markus Daft, RN, Sherre Poot Date/Time (Eastern Time): 08/26/2016 11:42:17 AM Confirm and document reason for call. If symptomatic, describe symptoms. 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 While driving she feels nauseous and she doesn't feel as alert as she out to be. Not as noticeable when she is not driving. Has had some of these off balance feelings today. Does the patient have any new or worsening symptoms? ---Yes Will a triage be completed? ---Yes Related visit to physician within the last 2 weeks? ---No Does the PT have any chronic conditions? (i.e. diabetes, asthma, etc.) ---Yes List chronic conditions. ---diverticulitis, colon surgery to remove part of bowel in 2016 and then required colosctomy then had it reversed last June 2017. Is this a behavioral health or substance abuse call? ---No Guidelines Guideline Title Affirmed Question Affirmed Notes Dizziness - Vertigo [1] MILD dizziness (e.g., vertigo; walking normally) AND [2] has NOT been evaluated by physician for this Final Disposition User See PCP When Office is Open (within 3 days) Markus Daft, RN, [place] Comments 117/74 BP on Sunday. While she feels off balance, she is not experiencing the room spinning with it. Appt made for 08/28/16 at 7:15 am with Alma Friendly NP. She plans to fast so she can have labs. Disagree/Comply: Comply

## 2016-08-28 ENCOUNTER — Other Ambulatory Visit: Payer: Self-pay | Admitting: Primary Care

## 2016-08-28 ENCOUNTER — Ambulatory Visit (INDEPENDENT_AMBULATORY_CARE_PROVIDER_SITE_OTHER): Payer: Managed Care, Other (non HMO) | Admitting: Primary Care

## 2016-08-28 ENCOUNTER — Encounter: Payer: Self-pay | Admitting: Primary Care

## 2016-08-28 VITALS — BP 124/84 | HR 76 | Temp 98.4°F | Ht 64.0 in | Wt 147.4 lb

## 2016-08-28 DIAGNOSIS — J019 Acute sinusitis, unspecified: Secondary | ICD-10-CM

## 2016-08-28 DIAGNOSIS — R5383 Other fatigue: Secondary | ICD-10-CM

## 2016-08-28 DIAGNOSIS — E785 Hyperlipidemia, unspecified: Secondary | ICD-10-CM

## 2016-08-28 LAB — COMPREHENSIVE METABOLIC PANEL
ALBUMIN: 4.7 g/dL (ref 3.5–5.2)
ALT: 13 U/L (ref 0–35)
AST: 20 U/L (ref 0–37)
Alkaline Phosphatase: 71 U/L (ref 39–117)
BUN: 15 mg/dL (ref 6–23)
CALCIUM: 10.1 mg/dL (ref 8.4–10.5)
CHLORIDE: 105 meq/L (ref 96–112)
CO2: 29 meq/L (ref 19–32)
CREATININE: 0.87 mg/dL (ref 0.40–1.20)
GFR: 70.02 mL/min (ref >60.00)
Glucose, Bld: 96 mg/dL (ref 70–99)
POTASSIUM: 4.5 meq/L (ref 3.5–5.1)
Sodium: 141 mEq/L (ref 135–145)
Total Bilirubin: 0.7 mg/dL (ref 0.2–1.2)
Total Protein: 7.7 g/dL (ref 6.0–8.3)

## 2016-08-28 LAB — LIPID PANEL
CHOLESTEROL: 221 mg/dL — AB (ref 0–200)
HDL: 75.7 mg/dL (ref >39.00)
LDL Cholesterol: 126 mg/dL — ABNORMAL HIGH (ref 0–99)
NonHDL: 145.24
TRIGLYCERIDES: 97 mg/dL (ref 0.0–149.0)
Total CHOL/HDL Ratio: 3
VLDL: 19.4 mg/dL (ref 0.0–40.0)

## 2016-08-28 LAB — CBC
HCT: 42.5 % (ref 36.0–46.0)
HEMOGLOBIN: 14.3 g/dL (ref 12.0–15.0)
MCHC: 33.7 g/dL (ref 30.0–36.0)
MCV: 95.7 fl (ref 78.0–100.0)
Platelets: 272 10*3/uL (ref 150.0–400.0)
RBC: 4.45 Mil/uL (ref 3.87–5.11)
RDW: 13.2 % (ref 11.5–15.5)
WBC: 5.9 10*3/uL (ref 4.0–10.5)

## 2016-08-28 LAB — VITAMIN B12: VITAMIN B 12: 577 pg/mL (ref 211–911)

## 2016-08-28 LAB — TSH: TSH: 3.59 u[IU]/mL (ref 0.35–4.50)

## 2016-08-28 LAB — VITAMIN D 25 HYDROXY (VIT D DEFICIENCY, FRACTURES): VITD: 39.86 ng/mL (ref 30.00–100.00)

## 2016-08-28 MED ORDER — AMOXICILLIN-POT CLAVULANATE 875-125 MG PO TABS: 1.0000 | ORAL_TABLET | Freq: Two times a day (BID) | ORAL | 0 refills | Status: DC

## 2016-08-28 NOTE — Patient Instructions (Addendum)
Complete lab work prior to leaving today.   Use your Flonase and take your Claritin daily at least for the next 2-3 weeks.  You may also try Meclizine 25 mg tablets. This may be purchased over the counter to reduce vertigo.   Schedule your physical at your convenience.  I'll be in touch soon! It was a pleasure to see you today!  How to Perform the Epley Maneuver The Epley maneuver is an exercise that relieves symptoms of vertigo. Vertigo is the feeling that you or your surroundings are moving when they are not. When you feel vertigo, you may feel like the room is spinning and have trouble walking. Dizziness is a little different than vertigo. When you are dizzy, you may feel unsteady or light-headed. You can do this maneuver at home whenever you have symptoms of vertigo. You can do it up to 3 times a day until your symptoms go away. Even though the Epley maneuver may relieve your vertigo for a few weeks, it is possible that your symptoms will return. This maneuver relieves vertigo, but it does not relieve dizziness. What are the risks? If it is done correctly, the Epley maneuver is considered safe. Sometimes it can lead to dizziness or nausea that goes away after a short time. If you develop other symptoms, such as changes in vision, weakness, or numbness, stop doing the maneuver and call your health care provider. How to perform the Epley maneuver 1. Sit on the edge of a bed or table with your back straight and your legs extended or hanging over the edge of the bed or table. 2. Turn your head halfway toward the affected ear or side. 3. Lie backward quickly with your head turned until you are lying flat on your back. You may want to position a pillow under your shoulders. 4. Hold this position for 30 seconds. You may experience an attack of vertigo. This is normal. 5. Turn your head to the opposite direction until your unaffected ear is facing the floor. 6. Hold this position for 30 seconds.  You may experience an attack of vertigo. This is normal. Hold this position until the vertigo stops. 7. Turn your whole body to the same side as your head. Hold for another 30 seconds. 8. Sit back up. You can repeat this exercise up to 3 times a day. Follow these instructions at home:  After doing the Epley maneuver, you can return to your normal activities.  Ask your health care provider if there is anything you should do at home to prevent vertigo. He or she may recommend that you:  Keep your head raised (elevated) with two or more pillows while you sleep.  Do not sleep on the side of your affected ear.  Get up slowly from bed.  Avoid sudden movements during the day.  Avoid extreme head movement, like looking up or bending over. Contact a health care provider if:  Your vertigo gets worse.  You have other symptoms, including:  Nausea.  Vomiting.  Headache. Get help right away if:  You have vision changes.  You have a severe or worsening headache or neck pain.  You cannot stop vomiting.  You have new numbness or weakness in any part of your body. Summary  Vertigo is the feeling that you or your surroundings are moving when they are not.  The Epley maneuver is an exercise that relieves symptoms of vertigo.  If the Epley maneuver is done correctly, it is considered safe. You  can do it up to 3 times a day. This information is not intended to replace advice given to you by your health care provider. Make sure you discuss any questions you have with your health care provider. Document Released: 03/29/2013 Document Revised: 02/12/2016 Document Reviewed: 02/12/2016 Elsevier Interactive Patient Education  2017 [employer].

## 2016-08-28 NOTE — Progress Notes (Signed)
Subjective:    Patient ID: Natalie [name], female    DOB: 05-19-1955, 61 y.o.   MRN: [id number]  HPI  Natalie [name] is a 61 year old female who presents today with a chief complaint of dizziness. She's felt off balance and dizzy for the past two weeks with increase intensity of symptoms for the past several days. She's also noticed fatigue and not feeling well. She's also noticed nausea, sinus pressure. She's blowing thick, yellow/green mucous from her nasal cavity. She's currently taking Claritin and Flonase as needed without much improvement. She is drinking 64 ounces of water daily.   Review of Systems  Constitutional: Positive for fatigue. Negative for fever.  Respiratory: Negative for shortness of breath.   Cardiovascular: Negative for chest pain.  Gastrointestinal: Positive for nausea. Negative for abdominal pain.  Neurological: Positive for dizziness. Negative for weakness and headaches.       Past Medical History:  Diagnosis Date  . Benign neoplasm of sigmoid colon   . Chronic constipation   . Chronic sinusitis   . Cystitis   . Diverticulitis   . Perforated bowel ([place])   . Pleural effusion on right 2016     Social History   Social History  . Marital status: Married    Spouse name: N/A  . Number of children: N/A  . Years of education: N/A   Occupational History  . Not on file.   Social History Main Topics  . Smoking status: Never Smoker  . Smokeless tobacco: Never Used  . Alcohol use No  . Drug use: No  . Sexual activity: Not on file   Other Topics Concern  . Not on file   Social History Narrative   Married.   Works for [employer] in [place].   Is a Equities trader.   Enjoys spending time with family, reading, walking. She is raising her granddaughter.       Past Surgical History:  Procedure Laterality Date  . COLON SURGERY    . COLONOSCOPY WITH PROPOFOL N/A 07/27/2015   Procedure: COLONOSCOPY WITH PROPOFOL;  Surgeon: Lucilla Lame,  MD;  Location: [place];  Service: Endoscopy;  Laterality: N/A;  . COLONOSCOPY WITH PROPOFOL N/A 03/14/2016   Procedure: COLONOSCOPY WITH PROPOFOL;  Surgeon: Lucilla Lame, MD;  Location: [place];  Service: Endoscopy;  Laterality: N/A;  Cannot arrive before 0830 AM  . COLOSTOMY REVERSAL N/A 09/26/2015   Procedure: COLOSTOMY REVERSAL / TAKE DOWN;  Surgeon: Hubbard Robinson, MD;  Location: [facility];  Service: General;  Laterality: N/A;  . LAPAROTOMY N/A 03/01/2015   Procedure: EXPLORATORY LAPAROTOMY, SIGMOID COLECTOMY, COLOSTOMY;  Surgeon: Hubbard Robinson, MD;  Location: [facility];  Service: General;  Laterality: N/A;  . POLYPECTOMY  03/14/2016   Procedure: POLYPECTOMY;  Surgeon: Lucilla Lame, MD;  Location: [place];  Service: Endoscopy;;    Family History  Problem Relation Age of Onset  . Stroke Mother   . Arthritis Father   . Lung cancer Father   . Colon cancer Maternal Aunt   . Breast cancer Paternal Aunt     Allergies  Allergen Reactions  . Codeine Nausea Only  . Levofloxacin Other (See Comments)    insomnia  . Sulfa Antibiotics Rash    Current Outpatient Prescriptions on File Prior to Visit  Medication Sig Dispense Refill  . cholecalciferol (VITAMIN D) 1000 units tablet Take 1,000 Units by mouth daily.    . fluticasone (FLONASE) 50 MCG/ACT nasal spray Place  1 spray into both nostrils daily as needed for allergies or rhinitis. Reported on 03/27/2015    . Multiple Vitamin (MULTIVITAMIN) tablet Take 1 tablet by mouth daily.    . Polyethylene Glycol POWD Take 17 g by mouth daily. 1000 g 5   No current facility-administered medications on file prior to visit.     BP 124/84   Pulse 76   Temp 98.4 F (36.9 C) (Oral)   Ht 5\' 4"  (1.626 m)   Wt 147 lb 6.4 oz (66.9 kg)   SpO2 98%   BMI 25.30 kg/m    Objective:   Physical Exam  Constitutional: She is oriented to person, place, and time. She appears well-nourished.  HENT:  Right Ear:  Tympanic membrane is bulging. Tympanic membrane is not erythematous. A middle ear effusion is present.  Left Ear: Tympanic membrane is bulging. Tympanic membrane is not erythematous.  Eyes: EOM are normal.  Neck: Neck supple.  Cardiovascular: Normal rate and regular rhythm.   Pulmonary/Chest: Effort normal and breath sounds normal.  Neurological: She is alert and oriented to person, place, and time. No cranial nerve deficit.  Skin: Skin is warm and dry.          Assessment & Plan:  Dizziness/Fatigue:  Present for the past two weeks, worse this week. Exam today with moderate effusion to right TM, otherwise exam unremarkable. Low suspicion for cardiac involvement, suspect symptoms triggered by inner ear involvement, also likely sinus infection.  Orthostatic Vitals: Negative. Will check CMP, CBC, TSH, Vitamin D and B 12 today. Discussed use of Meclizine and Epley's Maneuvers. Await lab results. Consider Augmentin for sinusitis.  Sheral Flow, NP

## 2016-12-31 ENCOUNTER — Telehealth: Payer: Self-pay

## 2016-12-31 NOTE — Telephone Encounter (Signed)
PLEASE NOTE: All timestamps contained within this report are represented as [place] Standard Time. CONFIDENTIALTY NOTICE: This fax transmission is intended only for the addressee. It contains information that is legally privileged, confidential or otherwise protected from use or disclosure. If you are not the intended recipient, you are strictly prohibited from reviewing, disclosing, copying using or disseminating any of this information or taking any action in reliance on or regarding this information. If you have received this fax in error, please notify us immediately by telephone so that we can arrange for its return to [place]. Phone: [phone], Toll-Free: [phone], Fax: [phone] Page: 1 of 2 Call Id: [id number] [place] Patient Name: Natalie [name] Gender: Female DOB: 15-Sep-1955 Age: 61 Y 9 M 10 D Return Phone Number: [phone] (Primary) Address: City/State/Zip: [place] [address] Client [facility] - Client Client Site [place] - Day Physician Alma Friendly - NP Contact Type Call Who Is Calling Patient / Member / Family / Caregiver Call Type Triage / Clinical Relationship To Patient Self Return Phone Number [phone] (Primary) Chief Complaint Dizziness Reason for Call Symptomatic / Request for [place] states she saw an ENT last Wednesday for dizziness. Did not have sx at the visit but was given a steroid dose pack that she finished Monday. Yesterday began feeling "not herself". With nausea, headache and just not feeling well. Is this from coming down from the steroids? Appointment Disposition EMR Appointment Not Necessary Info pasted into Epic Yes Translation No Nurse Assessment Nurse: Joline Salt, RN, Malachy Mood Date/Time Eilene Ghazi Time): 12/31/2016 12:32:24 PM Confirm and document reason  for call. If symptomatic, describe symptoms. ---Caller states she saw an ENT last Wednesday for dizziness. Did not have sx at the visit but was given a steroid dose pack that she finished Monday. Yesterday began feeling "not herself". With nausea, headache and just not feeling well. Is this from coming down from the steroids? The feeling is kind of a spaciness or like her equilibrium is off. Had diverticulitis and have lost the last 24 inches of colon. No fever. Does the patient have any new or worsening symptoms? ---Yes Will a triage be completed? ---Yes Related visit to physician within the last 2 weeks? ---Yes Does the PT have any chronic conditions? (i.e. diabetes, asthma, etc.) ---Yes List chronic conditions. ---diverticulitis Is this a behavioral health or substance abuse call? ---No Guidelines Guideline Title Affirmed Question Affirmed Notes Nurse Date/Time (Eastern Time) Weakness (Generalized) and Fatigue Taking a medicine that could cause weakness Catha Brow 12/31/2016 12:47:04 PM PLEASE NOTE: All timestamps contained within this report are represented as [place] Standard Time. CONFIDENTIALTY NOTICE: This fax transmission is intended only for the addressee. It contains information that is legally privileged, confidential or otherwise protected from use or disclosure. If you are not the intended recipient, you are strictly prohibited from reviewing, disclosing, copying using or disseminating any of this information or taking any action in reliance on or regarding this information. If you have received this fax in error, please notify us immediately by telephone so that we can arrange for its return to [place]. Phone: [phone], Toll-Free: [phone], Fax: [phone] Page: 2 of 2 Call Id: [id number] Guidelines Guideline Title Affirmed Question Affirmed Notes Nurse Date/Time Eilene Ghazi Time) (e.g., blood pressure medications, diuretics) Disp. Time Eilene Ghazi Time)  Disposition Final User 12/31/2016 12:51:04 PM See Physician within 24 Hours Yes Joline Salt, Therapist, sports,  [place]rskine Speed Disagree/Comply Comply Caller Understands Yes PreDisposition Did not know what to do Care Advice Given Per Guideline SEE PHYSICIAN WITHIN 24 HOURS: CALL BACK IF: * You become worse. CARE ADVICE given per Weakness and Fatigue (Adult) guideline. Referrals REFERRED TO PCP OFFICE REFERRED TO PCP OFFICE

## 2016-12-31 NOTE — Telephone Encounter (Signed)
Unable to get intouch with pt or Natalie [name].

## 2016-12-31 NOTE — Telephone Encounter (Signed)
The prednisone can cause headaches and dizziness. Is she feeling any better?

## 2017-01-02 ENCOUNTER — Emergency Department: Payer: Managed Care, Other (non HMO)

## 2017-01-02 ENCOUNTER — Emergency Department
Admission: EM | Admit: 2017-01-02 | Discharge: 2017-01-02 | Disposition: A | Discharge status: Home / Self Care | Payer: Managed Care, Other (non HMO) | Attending: Emergency Medicine | Admitting: Emergency Medicine

## 2017-01-02 DIAGNOSIS — R531 Weakness: Secondary | ICD-10-CM

## 2017-01-02 DIAGNOSIS — R42 Dizziness and giddiness: Secondary | ICD-10-CM | POA: Insufficient documentation

## 2017-01-02 DIAGNOSIS — Z79899 Other long term (current) drug therapy: Secondary | ICD-10-CM | POA: Diagnosis not present

## 2017-01-02 DIAGNOSIS — R110 Nausea: Secondary | ICD-10-CM | POA: Insufficient documentation

## 2017-01-02 DIAGNOSIS — R5383 Other fatigue: Secondary | ICD-10-CM | POA: Diagnosis present

## 2017-01-02 LAB — COMPREHENSIVE METABOLIC PANEL
ALK PHOS: 67 U/L (ref 38–126)
ALT: 17 U/L (ref 14–54)
AST: 22 U/L (ref 15–41)
Albumin: 4.1 g/dL (ref 3.5–5.0)
Anion gap: 9 (ref 5–15)
BILIRUBIN TOTAL: 0.6 mg/dL (ref 0.3–1.2)
BUN: 14 mg/dL (ref 6–20)
CALCIUM: 9.5 mg/dL (ref 8.9–10.3)
CO2: 24 mmol/L (ref 22–32)
CREATININE: 0.73 mg/dL (ref 0.44–1.00)
Chloride: 107 mmol/L (ref 101–111)
Glucose, Bld: 123 mg/dL — ABNORMAL HIGH (ref 65–99)
Potassium: 3.8 mmol/L (ref 3.5–5.1)
Sodium: 140 mmol/L (ref 135–145)
Total Protein: 7.5 g/dL (ref 6.5–8.1)

## 2017-01-02 LAB — CBC
HCT: 41.7 % (ref 35.0–47.0)
Hemoglobin: 14.8 g/dL (ref 12.0–16.0)
MCH: 33.1 pg (ref 26.0–34.0)
MCHC: 35.5 g/dL (ref 32.0–36.0)
MCV: 93.3 fL (ref 80.0–100.0)
PLATELETS: 288 10*3/uL (ref 150–440)
RBC: 4.47 MIL/uL (ref 3.80–5.20)
RDW: 12.9 % (ref 11.5–14.5)
WBC: 7.6 10*3/uL (ref 3.6–11.0)

## 2017-01-02 LAB — URINALYSIS, COMPLETE (UACMP) WITH MICROSCOPIC
Bilirubin Urine: NEGATIVE
GLUCOSE, UA: NEGATIVE mg/dL
Hgb urine dipstick: NEGATIVE
KETONES UR: NEGATIVE mg/dL
Leukocytes, UA: NEGATIVE
Nitrite: NEGATIVE
PROTEIN: NEGATIVE mg/dL
Specific Gravity, Urine: 1.004 — ABNORMAL LOW (ref 1.005–1.030)
pH: 6 (ref 5.0–8.0)

## 2017-01-02 LAB — GLUCOSE, CAPILLARY: Glucose-Capillary: 125 mg/dL — ABNORMAL HIGH (ref 65–99)

## 2017-01-02 LAB — TSH: TSH: 1.392 u[IU]/mL (ref 0.350–4.500)

## 2017-01-02 LAB — LIPASE, BLOOD: LIPASE: 27 U/L (ref 11–51)

## 2017-01-02 LAB — SEDIMENTATION RATE: Sed Rate: 9 mm/hr (ref 0–30)

## 2017-01-02 LAB — VITAMIN B12: VITAMIN B 12: 1464 pg/mL — AB (ref 180–914)

## 2017-01-02 MED ORDER — LORAZEPAM 1 MG PO TABS
ORAL_TABLET | ORAL | Status: AC
  Filled 2017-01-02: qty 2

## 2017-01-02 MED ORDER — LORAZEPAM 2 MG PO TABS
2.0000 mg | ORAL_TABLET | Freq: Once | ORAL | Status: AC
  Administered 2017-01-02: 2 mg via ORAL

## 2017-01-02 NOTE — ED Triage Notes (Signed)
Pt states that she has felt dizzy, nausea, headache, left lower abd pain since Tuesday. Pt stopped taking prescribed steroids on Monday. Unsure if related. Pt alert and oriented X4, active, cooperative, pt in NAD. RR even and unlabored, color WNL.

## 2017-01-02 NOTE — Telephone Encounter (Signed)
Left detailed message on voicemail to return call with update on her condition.

## 2017-01-02 NOTE — ED Notes (Signed)
Patient spoke with MRI. Patient requests a medication for anxiety due to claustrophobia prior to MRI.

## 2017-01-02 NOTE — ED Notes (Signed)
Patient taken to MRI

## 2017-01-02 NOTE — Discharge Instructions (Addendum)
Please follow-up with your regular doctor. You can also try to follow-up with neurology. I wrote down Dr. Lannie Fields information he isn't around one of the neurologists here. we need to keep investigating what's going on at this things that done here it came back today are within normal limits. There are couple testicle come back in the next few days that your doctor will be able to check on.

## 2017-01-02 NOTE — ED Provider Notes (Signed)
[facility] Emergency Department Provider Note   ____________________________________________   First MD Initiated Contact with Patient 01/02/17 1119     (approximate)  I have reviewed the triage vital signs and the nursing notes.   HISTORY  Chief Complaint Fatigue; Nausea; and Dizziness    HPI Natalie [name] is a 61 y.o. female Who reports she's had progressive lightheadedness and unsteadiness nausea and some headache for some time. She saw ENT last Wednesday and ENT did not feel that she had vertigo or Mnire's give her some prednisone that finished on Tuesday and her symptoms which had improved drastically on the prednisone are now back factor worse she is very unsteady feeling pressure behind her eyes lightheaded and off balance she is nauseated her energy level has been decreasing as well. She said she had some pain in the left flank although there is none currently she had a colectomy with repair 2 years ago the repair was last year for diverticulitis. She is nauseated   Past Medical History:  Diagnosis Date  . Benign neoplasm of sigmoid colon   . Chronic constipation   . Chronic sinusitis   . Cystitis   . Diverticulitis   . Perforated bowel ([place])   . Pleural effusion on right 2016    Patient Active Problem List   Diagnosis Date Noted  . Diverticulitis of large intestine without perforation or abscess without bleeding   . Benign neoplasm of ascending colon   . Intestinal bypass or anastomosis status   . Preventative health care 05/24/2015    Past Surgical History:  Procedure Laterality Date  . COLON SURGERY    . COLONOSCOPY WITH PROPOFOL N/A 07/27/2015   Procedure: COLONOSCOPY WITH PROPOFOL;  Surgeon: Lucilla Lame, MD;  Location: [place];  Service: Endoscopy;  Laterality: N/A;  . COLONOSCOPY WITH PROPOFOL N/A 03/14/2016   Procedure: COLONOSCOPY WITH PROPOFOL;  Surgeon: Lucilla Lame, MD;  Location: [place];  Service:  Endoscopy;  Laterality: N/A;  Cannot arrive before 0830 AM  . COLOSTOMY REVERSAL N/A 09/26/2015   Procedure: COLOSTOMY REVERSAL / TAKE DOWN;  Surgeon: Hubbard Robinson, MD;  Location: [facility];  Service: General;  Laterality: N/A;  . LAPAROTOMY N/A 03/01/2015   Procedure: EXPLORATORY LAPAROTOMY, SIGMOID COLECTOMY, COLOSTOMY;  Surgeon: Hubbard Robinson, MD;  Location: [facility];  Service: General;  Laterality: N/A;  . POLYPECTOMY  03/14/2016   Procedure: POLYPECTOMY;  Surgeon: Lucilla Lame, MD;  Location: [place];  Service: Endoscopy;;    Prior to Admission medications   Medication Sig Start Date End Date Taking? Authorizing Provider  amoxicillin-clavulanate (AUGMENTIN) 875-125 MG tablet Take 1 tablet by mouth 2 (two) times daily. 08/28/16   Pleas Koch, NP  cholecalciferol (VITAMIN D) 1000 units tablet Take 1,000 Units by mouth daily.    [provider]  fluticasone (FLONASE) 50 MCG/ACT nasal spray Place 1 spray into both nostrils daily as needed for allergies or rhinitis. Reported on 03/27/2015    [provider]  loratadine (CLARITIN) 10 MG tablet Take 10 mg by mouth daily.    [provider]  Multiple Vitamin (MULTIVITAMIN) tablet Take 1 tablet by mouth daily.    [provider]  Polyethylene Glycol POWD Take 17 g by mouth daily. 10/05/15   Hubbard Robinson, MD    Allergies Codeine; Levofloxacin; and Sulfa antibiotics  Family History  Problem Relation Age of Onset  . Stroke Mother   . Arthritis Father   . Lung  cancer Father   . Colon cancer Maternal Aunt   . Breast cancer Paternal Aunt     Social History Social History  Substance Use Topics  . Smoking status: Never Smoker  . Smokeless tobacco: Never Used  . Alcohol use No    Review of Systems  Constitutional: No fever/chills Eyes: No visual changes. ENT: No sore throat. Cardiovascular: Denies chest pain. Respiratory: Denies shortness of breath. Gastrointestinal:  No abdominal pain.  No nausea, no vomiting.  No diarrhea.  No constipation. Genitourinary: Negative for dysuria. Musculoskeletal: Negative for back pain. Skin: Negative for rash. Neurological: Negative for headaches, focal weakness or numbness.  ____________________________________________   PHYSICAL EXAM:  VITAL SIGNS: ED Triage Vitals  Enc Vitals Group     BP 01/02/17 0901 119/62     Pulse Rate 01/02/17 0901 (!) 104     Resp 01/02/17 0901 18     Temp 01/02/17 0901 98.1 F (36.7 C)     Temp Source 01/02/17 0901 Oral     SpO2 01/02/17 0901 99 %     Weight 01/02/17 0902 152 lb (68.9 kg)     Height 01/02/17 0902 5\' 4"  (1.626 m)     Head Circumference --      Peak Flow --      Pain Score --      Pain Loc --      Pain Edu? --      Excl. in [place]? --    Constitutional: Alert and oriented. Well appearing and in no acute distress. Eyes: Conjunctivae are normal. PERRL. EOMI.fundi are somewhat difficult to see because her pupils are small but they appear to be normal the fundi appear to be normal without evidence Head: Atraumatic. Nose: No congestion/rhinnorhea. Mouth/Throat: Mucous membranes are moist.  Oropharynx non-erythematous. Neck: No stridor.   Cardiovascular: Normal rate, regular rhythm. Grossly normal heart sounds.  Good peripheral circulation. Respiratory: Normal respiratory effort.  No retractions. Lungs CTAB. Gastrointestinal: Soft and nontender. No distention. No abdominal bruits. No CVA tenderness. Musculoskeletal: No lower extremity tenderness nor edema.  No joint effusions. Neurologic:  Normal speech and language. No gross focal neurologic deficits are appreciated.specifically cranial nerves II through XII are intact from the visual fields were not checked cerebellar finger to nose and rapid alternating movements in the hands are normal motor strength is 5 over 5 throughout and sensation is normal Skin:  Skin is warm, dry and intact. No rash noted. Psychiatric: Mood and  affect are normal. Speech and behavior are normal.  ____________________________________________   LABS (all labs ordered are listed, but only abnormal results are displayed)  Labs Reviewed  COMPREHENSIVE METABOLIC PANEL - Abnormal; Notable for the following:       Result Value   Glucose, Bld 123 (*)    All other components within normal limits  URINALYSIS, COMPLETE (UACMP) WITH MICROSCOPIC - Abnormal; Notable for the following:    Color, Urine STRAW (*)    APPearance CLEAR (*)    Specific Gravity, Urine 1.004 (*)    Bacteria, UA RARE (*)    Squamous Epithelial / LPF 0-5 (*)    All other components within normal limits  GLUCOSE, CAPILLARY - Abnormal; Notable for the following:    Glucose-Capillary 125 (*)    All other components within normal limits  LIPASE, BLOOD  CBC  TSH  SEDIMENTATION RATE  FOLATE RBC  VITAMIN B12  CBG MONITORING, ED   ____________________________________________  EKG EKG read and interpreted by me shows normal sinus rhythm  rate of 94 normal axis essentially normal EKG  ____________________________________________  RADIOLOGY   ____________________________________________   PROCEDURES  Procedure(s) performed:   Procedures  Critical Care performed:  ____________________________________________   INITIAL IMPRESSION / ASSESSMENT AND PLAN / ED COURSE  Pertinent labs & imaging results that were available during my care of the patient were reviewed by me and considered in my medical decision making (see chart for details).   I am unsure what is going on with the patient. Her labs and MRI etc. are essentially within normal limits. She will need continued follow-up to elucidate the cause of her complaints.     ____________________________________________   FINAL CLINICAL IMPRESSION(S) / ED DIAGNOSES  Final diagnoses:  Weakness  Dizziness      NEW MEDICATIONS STARTED DURING THIS VISIT:  New Prescriptions   No medications on file      Note:  This document was prepared using Dragon voice recognition software and may include unintentional dictation errors.    Nena Polio, MD 01/02/17 [phone]

## 2017-01-05 ENCOUNTER — Telehealth: Payer: Self-pay | Admitting: Primary Care

## 2017-01-05 LAB — FOLATE RBC
Folate, Hemolysate: 436.6 ng/mL
Folate, RBC: 1025 ng/mL (ref >498)
Hematocrit: 42.6 % (ref 34.0–46.6)

## 2017-01-05 NOTE — Telephone Encounter (Signed)
Pt has appt 01/06/17 at [place] NP.

## 2017-01-05 NOTE — Telephone Encounter (Signed)
This really should be in a 30 minute slot, please block the 10:45 am slot to allot for more time.

## 2017-01-05 NOTE — Telephone Encounter (Signed)
Patient Name: Natalie [name]  DOB: January 24, 1956    Initial Comment nausea, dizziness, weakness, indigestion, woozy type feeling in stomach    Nurse Assessment  Nurse: Renie Ora, RN, Ashby Dawes Date/Time (Eastern Time): 01/05/2017 12:00:16 PM  Confirm and document reason for call. If symptomatic, describe symptoms. ---Caller states that she has nausea, dizziness, weakness, indigestion, woozy type feeling in stomach ever since taking a steroid pack prescribed by her ENT. States she finished that last week. Seems like symptoms are getting worse each day though. States she did speak with a nurse and was told to see her PCP within 24 hours. States she felt worse Friday morning so she went to the ER instead. States that they did a lot of testing even an MRI of brain and all came back normal, but symptoms still persist.  Does the patient have any new or worsening symptoms? ---Yes  Will a triage be completed? ---Yes  Related visit to physician within the last 2 weeks? ---Yes  Does the PT have any chronic conditions? (i.e. diabetes, asthma, etc.) ---Yes  List chronic conditions. ---Diverticulitis-colectomy/colostomy reversal  Is this a behavioral health or substance abuse call? ---No     Guidelines    Guideline Title Affirmed Question Affirmed Notes  Dizziness - Lightheadedness [1] MODERATE dizziness (e.g., interferes with normal activities) AND [2] has been evaluated by physician for this    Final Disposition User   See PCP When Office is Open (within 3 days) Renie Ora, Therapist, sports, [place]    Comments  States she was taking Miralax when on the steroid, but stopped that and symptoms persist.  Appt available for 10 am tomorrow with NP, Alma Friendly. Patient accepts and states she will just have to check on a ride, but will call back to office to reschedule if needed.   Referrals  REFERRED TO PCP OFFICE   Caller Disagree/Comply Comply  Caller Understands Yes  PreDisposition Call Doctor

## 2017-01-05 NOTE — Telephone Encounter (Signed)
Patient made appointment to see Anda Kraft on Tuesday, 01/06/17

## 2017-01-06 ENCOUNTER — Encounter: Payer: Self-pay | Admitting: Primary Care

## 2017-01-06 ENCOUNTER — Ambulatory Visit (INDEPENDENT_AMBULATORY_CARE_PROVIDER_SITE_OTHER)
Admission: RE | Admit: 2017-01-06 | Discharge: 2017-01-06 | Disposition: A | Discharge status: Home / Self Care | Payer: Managed Care, Other (non HMO) | Source: Ambulatory Visit | Attending: Primary Care | Admitting: Primary Care

## 2017-01-06 ENCOUNTER — Telehealth: Payer: Self-pay | Admitting: Primary Care

## 2017-01-06 ENCOUNTER — Ambulatory Visit (INDEPENDENT_AMBULATORY_CARE_PROVIDER_SITE_OTHER): Payer: Managed Care, Other (non HMO) | Admitting: Primary Care

## 2017-01-06 VITALS — BP 120/72 | HR 81 | Temp 98.0°F | Ht 64.0 in | Wt 153.4 lb

## 2017-01-06 DIAGNOSIS — R1030 Lower abdominal pain, unspecified: Secondary | ICD-10-CM | POA: Diagnosis not present

## 2017-01-06 NOTE — Progress Notes (Signed)
Subjective:    Patient ID: Natalie [name], female    DOB: August 01, 1955, 61 y.o.   MRN: [id number]  HPI  Natalie [name] is a 61 year old female who presents today for emergency department follow up.  Natalie [name] presented to [facility] ED on 01/02/17 with complaints of fatigue, nausea, and dizziness. Her symptoms had been present for the past week without improvement after her ENT visit one week prior. Her ENT didn't feel as though her symptoms were secondary to Meniere's or vertigo, did provide her with a prescription for prednisone. After Natalie [name] completed the prednisone Natalie [name] felt very unsteady with lightheadedness, feeling off balance, nausea. Natalie [name] also reported left flank pain.    During her stay in the ED Natalie [name] underwent evaluation with labs (B12 elevated, CBC/CMP/UA/TSH/Folate/Sed Rate/Lipase unremarkable); MR Brain (unremarkable). The emergency department physician was unsure of the etiology of her symptoms and discharged her home later that day with recommendations for PCP follow up.  Since her ED visit Natalie [name]'s noticed abdominal irritation (bloating, cramping) that has been present since Natalie [name] completed the prednisone and prior to her ED visit. Her last bowel movement was a small amount this morning which was firm in nature. Natalie [name] continues to experience nausea, dizziness, fatigue, post nasal drip. Natalie [name] denies cough, fevers, vomiting. Natalie [name] denies changes in food, medications, activity level. Natalie [name]'s stopped taking Miralax and Claritin as Natalie [name] thought this may have contributed to her symptoms; no change in symptoms.   Review of Systems  Constitutional: Positive for fatigue. Negative for unexpected weight change.  HENT: Positive for congestion and postnasal drip.   Respiratory: Negative for cough and shortness of breath.   Genitourinary: Positive for urgency. Negative for dysuria and hematuria.  Allergic/Immunologic: Positive for environmental allergies.  Neurological: Positive for dizziness, light-headedness and  headaches.  Psychiatric/Behavioral: The patient is not nervous/anxious.        Past Medical History:  Diagnosis Date  . Benign neoplasm of sigmoid colon   . Chronic constipation   . Chronic sinusitis   . Cystitis   . Diverticulitis   . Perforated bowel ([place])   . Pleural effusion on right 2016     Social History   Social History  . Marital status: Married    Spouse name: N/A  . Number of children: N/A  . Years of education: N/A   Occupational History  . Not on file.   Social History Main Topics  . Smoking status: Never Smoker  . Smokeless tobacco: Never Used  . Alcohol use No  . Drug use: No  . Sexual activity: Not on file   Other Topics Concern  . Not on file   Social History Narrative   Married.   Works for [employer] in [place].   Is a Equities trader.   Enjoys spending time with family, reading, walking. Natalie [name] is raising her granddaughter.       Past Surgical History:  Procedure Laterality Date  . COLON SURGERY    . COLONOSCOPY WITH PROPOFOL N/A 07/27/2015   Procedure: COLONOSCOPY WITH PROPOFOL;  Surgeon: Lucilla Lame, MD;  Location: [place];  Service: Endoscopy;  Laterality: N/A;  . COLONOSCOPY WITH PROPOFOL N/A 03/14/2016   Procedure: COLONOSCOPY WITH PROPOFOL;  Surgeon: Lucilla Lame, MD;  Location: [place];  Service: Endoscopy;  Laterality: N/A;  Cannot arrive before 0830 AM  . COLOSTOMY REVERSAL N/A 09/26/2015   Procedure: COLOSTOMY REVERSAL / TAKE DOWN;  Surgeon: Hubbard Robinson, MD;  Location: [facility];  Service: General;  Laterality: N/A;  . LAPAROTOMY N/A 03/01/2015   Procedure: EXPLORATORY LAPAROTOMY, SIGMOID COLECTOMY, COLOSTOMY;  Surgeon: Hubbard Robinson, MD;  Location: [facility];  Service: General;  Laterality: N/A;  . POLYPECTOMY  03/14/2016   Procedure: POLYPECTOMY;  Surgeon: Lucilla Lame, MD;  Location: [place];  Service: Endoscopy;;    Family History  Problem Relation Age of Onset  .  Stroke Mother   . Arthritis Father   . Lung cancer Father   . Colon cancer Maternal Aunt   . Breast cancer Paternal Aunt     Allergies  Allergen Reactions  . Codeine Nausea Only  . Levofloxacin Other (See Comments)    insomnia  . Sulfa Antibiotics Rash    Current Outpatient Prescriptions on File Prior to Visit  Medication Sig Dispense Refill  . cholecalciferol (VITAMIN D) 1000 units tablet Take 1,000 Units by mouth daily.    . fluticasone (FLONASE) 50 MCG/ACT nasal spray Place 1 spray into both nostrils daily as needed for allergies or rhinitis. Reported on 03/27/2015    . loratadine (CLARITIN) 10 MG tablet Take 10 mg by mouth daily.    . Multiple Vitamin (MULTIVITAMIN) tablet Take 1 tablet by mouth daily.    . Polyethylene Glycol POWD Take 17 g by mouth daily. 1000 g 5   No current facility-administered medications on file prior to visit.     BP 120/72   Pulse 81   Temp 98 F (36.7 C) (Oral)   Ht 5\' 4"  (1.626 m)   Wt 153 lb 6.4 oz (69.6 kg)   SpO2 95%   BMI 26.33 kg/m    Objective:   Physical Exam  Constitutional: Natalie [name] is oriented to person, place, and time. Natalie [name] appears well-nourished.  Eyes: Pupils are equal, round, and reactive to light. EOM are normal.  Neck: Neck supple.  Cardiovascular: Normal rate and regular rhythm.   Pulmonary/Chest: Effort normal and breath sounds normal.  Abdominal: Soft. Bowel sounds are normal. There is no tenderness.  Neurological: Natalie [name] is alert and oriented to person, place, and time. Natalie [name] has normal reflexes. No cranial nerve deficit. Coordination normal.  Skin: Skin is warm and dry.  Psychiatric: Natalie [name] has a normal mood and affect.          Assessment & Plan:  ED Follow Up:  Presented to [facility] ED for further evaluation of dizziness, nausea, fatigue. Full work up in ED overall unremarkable, physician unsure of etiology of her cause. Exam today unremarkable.  Will check abdominal xray given symptoms of lower abdominal pain and  history of abdominal bypass.  Offered neurology evaluation for which Natalie [name] kindly declined.  Stop B 12. Resume Miralax and Claritin. Ensure proper hydration with fluids, healthy diet, regular exercise.  All hospital notes, labs, imaging reviewed. Sheral Flow, NP

## 2017-01-06 NOTE — Telephone Encounter (Signed)
Spoken to patient. She stated that given her history and the perforated bowel. She wanted to know is there something else that we can do. She stated that she does not want to sit on this and try to find out what else could be going on.

## 2017-01-06 NOTE — Telephone Encounter (Signed)
Pt is requesting a call concerning 10/2 x-ray. She saw note in [place] but has questions.

## 2017-01-06 NOTE — Patient Instructions (Signed)
Stop taking Vitamin B 12.  Complete xray(s) prior to leaving today. I will notify you of your results once received.  Resume your Miralax for constipation. Ensure you are staying hydrated with water.  It was a pleasure to see you today!

## 2017-01-06 NOTE — Telephone Encounter (Signed)
We can proceed with CT scan of her abdomen given history. I'll place the order.  If this is negative then I recommend she see neurology for her dizziness.

## 2017-01-07 NOTE — Telephone Encounter (Signed)
Per DPR, left detail message of Kate's comments for patient. 

## 2017-01-14 ENCOUNTER — Ambulatory Visit
Admission: RE | Admit: 2017-01-14 | Discharge: 2017-01-14 | Disposition: A | Discharge status: Home / Self Care | Payer: Managed Care, Other (non HMO) | Source: Ambulatory Visit | Attending: Primary Care | Admitting: Primary Care

## 2017-01-14 DIAGNOSIS — R1030 Lower abdominal pain, unspecified: Secondary | ICD-10-CM | POA: Insufficient documentation

## 2017-01-14 DIAGNOSIS — M6208 Separation of muscle (nontraumatic), other site: Secondary | ICD-10-CM | POA: Insufficient documentation

## 2017-01-14 MED ORDER — IOPAMIDOL (ISOVUE-300) INJECTION 61%
100.0000 mL | Freq: Once | INTRAVENOUS | Status: AC | PRN
  Administered 2017-01-14: 100 mL via INTRAVENOUS

## 2017-03-09 ENCOUNTER — Other Ambulatory Visit: Payer: Self-pay | Admitting: Family Medicine

## 2017-03-09 ENCOUNTER — Other Ambulatory Visit: Payer: Self-pay | Admitting: Internal Medicine

## 2017-03-09 DIAGNOSIS — Z1231 Encounter for screening mammogram for malignant neoplasm of breast: Secondary | ICD-10-CM

## 2017-04-03 ENCOUNTER — Ambulatory Visit
Admission: RE | Admit: 2017-04-03 | Discharge: 2017-04-03 | Disposition: A | Discharge status: Home / Self Care | Payer: Managed Care, Other (non HMO) | Source: Ambulatory Visit | Attending: Family Medicine | Admitting: Family Medicine

## 2017-04-03 DIAGNOSIS — Z1231 Encounter for screening mammogram for malignant neoplasm of breast: Secondary | ICD-10-CM | POA: Diagnosis not present

## 2017-11-23 ENCOUNTER — Encounter: Payer: Self-pay | Admitting: Internal Medicine

## 2017-11-27 ENCOUNTER — Encounter: Payer: Self-pay | Admitting: Internal Medicine

## 2017-11-27 ENCOUNTER — Ambulatory Visit: Payer: Managed Care, Other (non HMO) | Admitting: Internal Medicine

## 2017-11-27 VITALS — BP 121/81 | HR 98 | Resp 16 | Ht 64.0 in | Wt 157.0 lb

## 2017-11-27 DIAGNOSIS — R5383 Other fatigue: Secondary | ICD-10-CM | POA: Diagnosis not present

## 2017-11-27 DIAGNOSIS — G43109 Migraine with aura, not intractable, without status migrainosus: Secondary | ICD-10-CM | POA: Diagnosis not present

## 2017-11-27 DIAGNOSIS — F0630 Mood disorder due to known physiological condition, unspecified: Secondary | ICD-10-CM

## 2017-11-27 DIAGNOSIS — R350 Frequency of micturition: Secondary | ICD-10-CM | POA: Diagnosis not present

## 2017-11-27 DIAGNOSIS — H6592 Unspecified nonsuppurative otitis media, left ear: Secondary | ICD-10-CM | POA: Insufficient documentation

## 2017-11-27 DIAGNOSIS — G43809 Other migraine, not intractable, without status migrainosus: Secondary | ICD-10-CM | POA: Insufficient documentation

## 2017-11-27 LAB — POCT URINALYSIS DIPSTICK
Bilirubin, UA: NEGATIVE
GLUCOSE UA: NEGATIVE
Ketones, UA: NEGATIVE
LEUKOCYTES UA: NEGATIVE
Nitrite, UA: NEGATIVE
PH UA: 6.5 (ref 5.0–8.0)
Protein, UA: NEGATIVE
RBC UA: NEGATIVE
Spec Grav, UA: 1.015 (ref 1.010–1.025)
Urobilinogen, UA: 0.2 E.U./dL

## 2017-11-27 MED ORDER — FLUTICASONE PROPIONATE 50 MCG/ACT NA SUSP: 1.0000 | Freq: Every day | NASAL | 6 refills | Status: DC | PRN

## 2017-11-27 MED ORDER — VENLAFAXINE HCL ER 37.5 MG PO CP24: 37.5000 mg | ORAL_CAPSULE | Freq: Every day | ORAL | 1 refills | Status: DC

## 2017-11-27 NOTE — Patient Instructions (Signed)
Resume use of Flonase NS daily  Begin Effexor XR 37.5 mg once a day

## 2017-11-27 NOTE — Progress Notes (Signed)
Date:  11/27/2017   Name:  Natalie [name]   DOB:  1955-06-13   MRN:  [id number]   Chief Complaint: Establish Care and Dizziness (Off balance and unable to drive and wants to stay in bed most of day. Nausea and LOA and Headache included starting 2 weeks ago but worsening over past few days. Had these issues in Nov and went to Neurology in January and was DX with Vasticular Migraines. Also seen by ENT and cleared for Vertigo.  )  Diverticulitis with perforation in 2016 - had colostomy and then reversal.  Has had constipation her whole life and it continues even with 24 inches of sigmoid colon resected.  She takes Mirilax daily.  Dizziness - seen by Dr. Manuella Ghazi, MRI normal.  Put on magnesium for "vestibular migraines" in Jan 2019.  It may have helped a bit at first but now worsening over the past month. She did stop Mg due to concern over the appropriate formulation to take.  She had a follow up appt in 2 weeks.  She has had Meniere's ruled out by ENT. She had Epley done with no sx. She thinks she has some chronic sinus issues and was put on Asteline nasal spray. She has flonase but not using it.  Urinary frequency - concerned about possible occult infection causing her fatigue and frequency.  No blood in urine, no dysuria, no weight loss.  Depression - she has had depressed mood, husband thinks her personality has changed.  She is not happy any more and wants to do things but she just has no interest.  Review of Systems  Constitutional: Positive for fatigue. Negative for chills, fever and unexpected weight change.  HENT: Positive for postnasal drip. Negative for ear pain (but ear fullness), sinus pressure, sinus pain and trouble swallowing.   Respiratory: Negative for cough, chest tightness, shortness of breath and wheezing.   Cardiovascular: Negative for chest pain and palpitations.  Gastrointestinal: Positive for constipation.  Endocrine: Negative for polydipsia and polyuria.    Genitourinary: Positive for frequency and urgency. Negative for difficulty urinating and dysuria.  Musculoskeletal: Negative for back pain, gait problem and myalgias.  Neurological: Positive for dizziness and headaches.  Hematological: Negative for adenopathy.  Psychiatric/Behavioral: Positive for dysphoric mood. Negative for sleep disturbance. The patient is not nervous/anxious.     Patient Active Problem List   Diagnosis Date Noted  . Diverticulitis of large intestine without perforation or abscess without bleeding   . Benign neoplasm of ascending colon   . Intestinal bypass or anastomosis status     Allergies  Allergen Reactions  . Codeine Nausea Only  . Levofloxacin Other (See Comments)    insomnia  . Sulfa Antibiotics Rash    Past Surgical History:  Procedure Laterality Date  . COLON SURGERY    . COLONOSCOPY WITH PROPOFOL N/A 07/27/2015   Procedure: COLONOSCOPY WITH PROPOFOL;  Surgeon: Lucilla Lame, MD;  Location: [facility];  Service: Endoscopy;  Laterality: N/A;  . COLONOSCOPY WITH PROPOFOL N/A 03/14/2016   Procedure: COLONOSCOPY WITH PROPOFOL;  Surgeon: Lucilla Lame, MD;  Location: [place];  Service: Endoscopy;  Laterality: N/A;  Cannot arrive before 0830 AM  . COLOSTOMY REVERSAL N/A 09/26/2015   Procedure: COLOSTOMY REVERSAL / TAKE DOWN;  Surgeon: Hubbard Robinson, MD;  Location: [facility];  Service: General;  Laterality: N/A;  . LAPAROTOMY N/A 03/01/2015   Procedure: EXPLORATORY LAPAROTOMY, SIGMOID COLECTOMY, COLOSTOMY;  Surgeon: Hubbard Robinson, MD;  Location: [facility];  Service: General;  Laterality: N/A;  . POLYPECTOMY  03/14/2016   Procedure: POLYPECTOMY;  Surgeon: Lucilla Lame, MD;  Location: [place];  Service: Endoscopy;;    Social History   Tobacco Use  . Smoking status: Never Smoker  . Smokeless tobacco: Never Used  Substance Use Topics  . Alcohol use: No    Alcohol/week: 0.0 standard drinks  . Drug use: No      Medication list has been reviewed and updated.  Current Meds  Medication Sig  . cholecalciferol (VITAMIN D) 1000 units tablet Take 1,000 Units by mouth daily.  . fluticasone (FLONASE) 50 MCG/ACT nasal spray Place 1 spray into both nostrils daily as needed for allergies or rhinitis. Reported on 03/27/2015  . Magnesium 400 MG TABS Take by mouth.  . Multiple Vitamin (MULTIVITAMIN) tablet Take 1 tablet by mouth daily.  . Polyethylene Glycol POWD Take 17 g by mouth daily.    PHQ 2/9 Scores 11/27/2017  PHQ - 2 Score 3  PHQ- 9 Score 12    Physical Exam  Constitutional: She is oriented to person, place, and time. She appears well-developed. No distress.  HENT:  Head: Normocephalic and atraumatic.  Right Ear: Tympanic membrane and ear canal normal.  Left Ear: Ear canal normal. A middle ear effusion is present.  Nose: Right sinus exhibits no maxillary sinus tenderness and no frontal sinus tenderness. Left sinus exhibits no maxillary sinus tenderness and no frontal sinus tenderness.  Mouth/Throat: No posterior oropharyngeal edema or posterior oropharyngeal erythema.  Neck: Normal range of motion. Neck supple.  Cardiovascular: Normal rate, regular rhythm and normal heart sounds.  Pulmonary/Chest: Effort normal and breath sounds normal. No respiratory distress. She has no wheezes. She has no rales.  Abdominal: Soft. Bowel sounds are normal.  Musculoskeletal: Normal range of motion. She exhibits no edema or tenderness.  Neurological: She is alert and oriented to person, place, and time.  Skin: Skin is warm and dry. No rash noted.  Psychiatric: She has a normal mood and affect. Her behavior is normal. Thought content normal.  Nursing note and vitals reviewed.   BP 121/81   Pulse 98   Resp 16   Ht 5\' 4"  (1.626 m)   Wt 157 lb (71.2 kg)   SpO2 100%   BMI 26.95 kg/m   Assessment and Plan: 1. Vestibular migraine Resume magnesium Add Effexor - venlafaxine XR (EFFEXOR XR) 37.5 MG 24  hr capsule; Take 1 capsule (37.5 mg total) by mouth daily with breakfast.  Dispense: 30 capsule; Refill: 1  2. Middle ear effusion, left Resume flonase - fluticasone (FLONASE) 50 MCG/ACT nasal spray; Place 1 spray into both nostrils daily as needed for allergies or rhinitis. Reported on 03/27/2015  Dispense: 16 g; Refill: 6  3. Mood disorder due to a general medical condition May respond to low dose effexor - Comprehensive metabolic panel - TSH - venlafaxine XR (EFFEXOR XR) 37.5 MG 24 hr capsule; Take 1 capsule (37.5 mg total) by mouth daily with breakfast.  Dispense: 30 capsule; Refill: 1  4. Fatigue, unspecified type Check labs - Hemoglobin A1c - TSH  5. Urinary frequency UA negative - POCT urinalysis dipstick   Meds ordered this encounter  Medications  . venlafaxine XR (EFFEXOR XR) 37.5 MG 24 hr capsule    Sig: Take 1 capsule (37.5 mg total) by mouth daily with breakfast.    Dispense:  30 capsule    Refill:  1  . fluticasone (FLONASE) 50 MCG/ACT nasal spray  Sig: Place 1 spray into both nostrils daily as needed for allergies or rhinitis. Reported on 03/27/2015    Dispense:  16 g    Refill:  6    Partially dictated using Editor, commissioning. Any errors are unintentional.  Halina Maidens, MD [facility]  11/27/2017

## 2017-11-28 LAB — COMPREHENSIVE METABOLIC PANEL
A/G RATIO: 1.7 (ref 1.2–2.2)
ALBUMIN: 4.7 g/dL (ref 3.6–4.8)
ALT: 15 IU/L (ref 0–32)
AST: 18 IU/L (ref 0–40)
Alkaline Phosphatase: 81 IU/L (ref 39–117)
BILIRUBIN TOTAL: 0.3 mg/dL (ref 0.0–1.2)
BUN / CREAT RATIO: 14 (ref 12–28)
BUN: 13 mg/dL (ref 8–27)
CHLORIDE: 103 mmol/L (ref 96–106)
CO2: 23 mmol/L (ref 20–29)
Calcium: 9.8 mg/dL (ref 8.7–10.3)
Creatinine, Ser: 0.9 mg/dL (ref 0.57–1.00)
GFR calc non Af Amer: 68 mL/min/{1.73_m2} (ref >59)
GFR, EST AFRICAN AMERICAN: 79 mL/min/{1.73_m2} (ref >59)
GLOBULIN, TOTAL: 2.8 g/dL (ref 1.5–4.5)
GLUCOSE: 89 mg/dL (ref 65–99)
POTASSIUM: 5 mmol/L (ref 3.5–5.2)
SODIUM: 143 mmol/L (ref 134–144)
Total Protein: 7.5 g/dL (ref 6.0–8.5)

## 2017-11-28 LAB — TSH: TSH: 2.06 u[IU]/mL (ref 0.450–4.500)

## 2017-11-28 LAB — HEMOGLOBIN A1C
Est. average glucose Bld gHb Est-mCnc: 111 mg/dL
HEMOGLOBIN A1C: 5.5 % (ref 4.8–5.6)

## 2017-12-04 ENCOUNTER — Telehealth: Payer: Self-pay

## 2017-12-04 NOTE — Telephone Encounter (Signed)
Patient called stating she has taken 5 doses of Effexor medication and she feels worse everyday. Dizziness, along with nausea. She stated the medication makes her feel "drunk and unstable." has not taken the medication today.  Wants to know if any alternative medication she can try?  Please Advise.

## 2017-12-04 NOTE — Telephone Encounter (Signed)
She should stop the medication and once all the side effects resolve, call back for recommendations.

## 2017-12-04 NOTE — Telephone Encounter (Signed)
Please advise while pt on phone.

## 2017-12-08 ENCOUNTER — Telehealth: Payer: Self-pay

## 2017-12-08 NOTE — Telephone Encounter (Signed)
She has an appt with Dr. Manuella Ghazi; Neurology in 3 days.  Remain off of effexor and discuss alternative treatment with Dr. Manuella Ghazi.  I do not have any other suggestions that are approved for this problem.

## 2017-12-08 NOTE — Telephone Encounter (Signed)
Patient informed. Will discuss with Manuella Ghazi.

## 2017-12-08 NOTE — Telephone Encounter (Signed)
Patient was told last week to call and get new recommendations for medication since she had to stop taking Effexor for her side affects.  Please Advise this and previous telephone calls.

## 2017-12-28 ENCOUNTER — Encounter: Payer: Self-pay | Admitting: Internal Medicine

## 2017-12-28 ENCOUNTER — Ambulatory Visit: Payer: Managed Care, Other (non HMO) | Admitting: Internal Medicine

## 2017-12-28 VITALS — BP 114/78 | HR 76 | Ht 64.0 in | Wt 158.6 lb

## 2017-12-28 DIAGNOSIS — R0981 Nasal congestion: Secondary | ICD-10-CM | POA: Diagnosis not present

## 2017-12-28 DIAGNOSIS — G43109 Migraine with aura, not intractable, without status migrainosus: Secondary | ICD-10-CM

## 2017-12-28 DIAGNOSIS — G43809 Other migraine, not intractable, without status migrainosus: Secondary | ICD-10-CM

## 2017-12-28 DIAGNOSIS — F418 Other specified anxiety disorders: Secondary | ICD-10-CM | POA: Diagnosis not present

## 2017-12-28 MED ORDER — MONTELUKAST SODIUM 10 MG PO TABS: 10.0000 mg | ORAL_TABLET | Freq: Every day | ORAL | 3 refills | Status: DC

## 2017-12-28 MED ORDER — BUPROPION HCL 75 MG PO TABS: 37.5000 mg | ORAL_TABLET | Freq: Two times a day (BID) | ORAL | 1 refills | Status: DC

## 2017-12-28 MED ORDER — MOMETASONE FUROATE 50 MCG/ACT NA SUSP: 2.0000 | Freq: Every day | NASAL | 12 refills | Status: DC

## 2017-12-28 NOTE — Progress Notes (Signed)
Date:  12/28/2017   Name:  Natalie [name]   DOB:  06-May-1955   MRN:  [id number]   Chief Complaint: Depression (Follow up. Effexor caused her to have severe nausea and felt off balance. Took for 5 days and then called office. )  Depression         This is a new problem.  The onset quality is undetermined.   The problem has been gradually improving since onset.  Associated symptoms include headaches.  Associated symptoms include no fatigue and no myalgias.  Past treatments include SNRIs - Serotonin and norepinephrine reuptake inhibitors (could not tolerate effexor).  Improvement on treatment: she thought that she felt better emotionally while the effexor was being cleared from her system.  Past medical history includes anxiety.   Migraine   This is a recurrent (felt to be vestibular migraines) problem. The problem occurs intermittently. Associated symptoms include dizziness and sinus pressure. Pertinent negatives include no back pain, coughing, ear pain (but ear fullness), fever or sore throat. Treatments tried: recently started on topamax and imitrex by Neurology but could not take topamax.  Sinus Problem  This is a chronic problem. The problem has been gradually worsening since onset. There has been no fever. The pain is mild. Associated symptoms include congestion, headaches and sinus pressure. Pertinent negatives include no chills, coughing, ear pain (but ear fullness), shortness of breath or sore throat. Past treatments include spray decongestants. The treatment provided mild relief.  Anxiety  Presents for follow-up visit. Symptoms include dizziness and nervous/anxious behavior (especially about driving - worried that she will have dizziness while driving). Patient reports no chest pain, confusion, palpitations or shortness of breath. Symptoms occur most days. The severity of symptoms is moderate and interfering with daily activities (needs to be able to drive grandchilren to and from school).        Review of Systems  Constitutional: Negative for chills, fatigue, fever and unexpected weight change.  HENT: Positive for congestion, postnasal drip and sinus pressure. Negative for ear pain (but ear fullness), sinus pain, sore throat and trouble swallowing.   Respiratory: Negative for cough, chest tightness, shortness of breath and wheezing.   Cardiovascular: Negative for chest pain and palpitations.  Gastrointestinal: Positive for constipation.  Endocrine: Negative for polydipsia and polyuria.  Genitourinary: Negative for difficulty urinating and dysuria.  Musculoskeletal: Negative for back pain, gait problem and myalgias.  Neurological: Positive for dizziness and headaches.  Hematological: Negative for adenopathy.  Psychiatric/Behavioral: Positive for depression. Negative for confusion, dysphoric mood and sleep disturbance. The patient is nervous/anxious (especially about driving - worried that she will have dizziness while driving).     Patient Active Problem List   Diagnosis Date Noted  . Vestibular migraine 11/27/2017  . Middle ear effusion, left 11/27/2017  . 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 Diverticulitis of large intestine without perforation or abscess without bleeding   . Benign neoplasm of ascending colon   . Intestinal bypass or anastomosis status     Allergies  Allergen Reactions  . Codeine Nausea Only  . Levofloxacin Other (See Comments)    insomnia  . Effexor [Venlafaxine] Nausea Only    And dizziness  . Sulfa Antibiotics Rash  . Topamax [Topiramate] Other (See Comments)    sedation    Past Surgical History:  Procedure Laterality Date  . COLONOSCOPY WITH PROPOFOL N/A 07/27/2015   Procedure: COLONOSCOPY WITH PROPOFOL;  Surgeon: Lucilla Lame, MD;  Location: [place];  Service: Endoscopy;  Laterality: N/A;  . COLONOSCOPY WITH PROPOFOL N/A 03/14/2016   Procedure: COLONOSCOPY WITH PROPOFOL;  Surgeon: Lucilla Lame, MD;   Location: [place];  Service: Endoscopy;  Laterality: N/A;  Cannot arrive before 0830 AM  . COLOSTOMY REVERSAL N/A 09/26/2015   Procedure: COLOSTOMY REVERSAL / TAKE DOWN;  Surgeon: Hubbard Robinson, MD;  Location: [facility];  Service: General;  Laterality: N/A;  . LAPAROTOMY N/A 03/01/2015   Procedure: EXPLORATORY LAPAROTOMY, SIGMOID COLECTOMY, COLOSTOMY;  Surgeon: Hubbard Robinson, MD;  Location: [facility];  Service: General;  Laterality: N/A;  . POLYPECTOMY  03/14/2016   Procedure: POLYPECTOMY;  Surgeon: Lucilla Lame, MD;  Location: [place];  Service: Endoscopy;;    Social History   Tobacco Use  . Smoking status: Never Smoker  . Smokeless tobacco: Never Used  Substance Use Topics  . Alcohol use: No    Alcohol/week: 0.0 standard drinks  . Drug use: No     Medication list has been reviewed and updated.  Current Meds  Medication Sig  . cholecalciferol (VITAMIN D) 1000 units tablet Take 1,000 Units by mouth daily.  . fluticasone (FLONASE) 50 MCG/ACT nasal spray Place 1 spray into both nostrils daily as needed for allergies or rhinitis. Reported on 03/27/2015  . Magnesium 400 MG TABS Take by mouth.  . Multiple Vitamin (MULTIVITAMIN) tablet Take 1 tablet by mouth daily.  . polyethylene glycol (MIRALAX / GLYCOLAX) packet Take 17 g by mouth daily.    PHQ 2/9 Scores 12/28/2017 11/27/2017  PHQ - 2 Score 0 3  PHQ- 9 Score 0 12    Physical Exam  Constitutional: She is oriented to person, place, and time. She appears well-developed and well-nourished.  HENT:  Right Ear: External ear and ear canal normal. Tympanic membrane is not erythematous and not retracted.  Left Ear: External ear and ear canal normal. Tympanic membrane is not erythematous and not retracted.  Nose: Right sinus exhibits maxillary sinus tenderness. Right sinus exhibits no frontal sinus tenderness. Left sinus exhibits maxillary sinus tenderness. Left sinus exhibits no frontal sinus tenderness.    Mouth/Throat: Uvula is midline and mucous membranes are normal. No oral lesions. No oropharyngeal exudate or posterior oropharyngeal erythema.  Enlarged turbinates  Cardiovascular: Normal rate, regular rhythm and normal heart sounds.  Pulmonary/Chest: Effort normal and breath sounds normal. She has no wheezes. She has no rales.  Lymphadenopathy:    She has no cervical adenopathy.  Neurological: She is alert and oriented to person, place, and time.    BP 114/78 (BP Location: Right Arm, Patient Position: Sitting, Cuff Size: Normal)   Pulse 76   Ht 5\' 4"  (1.626 m)   Wt 158 lb 9.6 oz (71.9 kg)   SpO2 100%   BMI 27.22 kg/m   Assessment and Plan: 1. Nasal sinus congestion Stop flonase due to bleeding and begin Nasonex Add singulair If dizziness continues, consider ENT evaluation - montelukast (SINGULAIR) 10 MG tablet; Take 1 tablet (10 mg total) by mouth at bedtime.  Dispense: 30 tablet; Refill: 3 - mometasone (NASONEX) 50 MCG/ACT nasal spray; Place 2 sprays into the nose daily.  Dispense: 17 g; Refill: 12  2. Other specified anxiety disorders Begin very low dose wellbutrin If not tolerated, consider very low dose Lexapro (2.5 mg) or Effexor (12.5 mg bid) - buPROPion (WELLBUTRIN) 75 MG tablet; Take 0.5 tablets (37.5 mg total) by mouth 2 (two) times daily.  Dispense: 30 tablet; Refill: 1  3. Vestibular migraine imitrex as needed  Did not tolerate topamax or regular dose effexor   Partially dictated using Editor, commissioning. Any errors are unintentional.  Halina Maidens, MD [facility]  12/28/2017

## 2017-12-31 ENCOUNTER — Telehealth: Payer: Self-pay

## 2017-12-31 ENCOUNTER — Other Ambulatory Visit: Payer: Self-pay | Admitting: Internal Medicine

## 2017-12-31 DIAGNOSIS — J019 Acute sinusitis, unspecified: Secondary | ICD-10-CM

## 2017-12-31 MED ORDER — AZITHROMYCIN 250 MG PO TABS: ORAL_TABLET | ORAL | 0 refills | Status: DC

## 2017-12-31 NOTE — Telephone Encounter (Signed)
Z pak sent to pharmacy

## 2017-12-31 NOTE — Telephone Encounter (Signed)
Patient called stating she was just seen Monday. Started the Singulair and is breathing much better. She is now having facial pressure and pain and believes this has turned into sinus infection. Wanted to know if we could call antibiotics in for her.  Please Advise.  Pharmacy: [facility] [place].

## 2018-01-01 NOTE — Telephone Encounter (Signed)
Patient informed. Will pick up Zpack and continue Singulair daily.

## 2018-01-06 ENCOUNTER — Telehealth: Payer: Self-pay

## 2018-01-06 NOTE — Telephone Encounter (Signed)
Okay to stop singulair.  I am happy to refer to ENT of choice - I do not know Dr. Lucia Gaskins.

## 2018-01-06 NOTE — Telephone Encounter (Signed)
Patient called stating she is still not feeling well from [place]. Finished Zpack and still taking Singulair but she believes she is having some side affects to the Singulair medication. She states she is having nose bleeds, headaches, and severe congestion. Now down to 1/2 tablet of Singulair daily. Wanted to know if we could refer to ENT. She found Dr Lucia Gaskins ENT in [place] and wanted to know if you recommend him personally.  Please Advise.

## 2018-01-06 NOTE — Telephone Encounter (Signed)
Patient informed - will stop Singulair. Will call and make own appt for ENT. And call back if referral needed.

## 2018-03-09 ENCOUNTER — Encounter: Payer: Self-pay | Admitting: Internal Medicine

## 2018-03-09 ENCOUNTER — Ambulatory Visit: Payer: Managed Care, Other (non HMO) | Admitting: Internal Medicine

## 2018-03-09 VITALS — BP 128/80 | HR 76 | Ht 64.0 in | Wt 156.0 lb

## 2018-03-09 DIAGNOSIS — F0630 Mood disorder due to known physiological condition, unspecified: Secondary | ICD-10-CM

## 2018-03-09 DIAGNOSIS — R42 Dizziness and giddiness: Secondary | ICD-10-CM | POA: Diagnosis not present

## 2018-03-09 NOTE — Progress Notes (Signed)
Date:  03/09/2018   Name:  Natalie [name]   DOB:  Dec 17, 1955   MRN:  [id number]   Chief Complaint: Dizziness (off and on for a "couple of years"- gets dizzy when stands up like "I'm going to pass out") and Depression (PHQ=7)  Dizziness  This is a new problem. The current episode started 1 to 4 weeks ago. The problem occurs daily. The problem has been unchanged. Associated symptoms include fatigue. Pertinent negatives include no abdominal pain, chest pain, chills, coughing, fever, headaches, vertigo, visual change or vomiting. The symptoms are aggravated by bending, walking and standing. She has tried nothing for the symptoms.  She has longstanding vertigo type sx - was evaluated by ENT - negative tilt table testings.  Then seen by Neurology - treated for vestibular migraine.  MRI was normal.  She did not tolerate Topiramate.   Current sx are a bit different - she feels like she might pass out, like a shade coming down over her eyes.  Especially worse going from sitting to standing but also occurs while sitting or with head turning.  No palpitations or rapid heart beat.  No fever or chills. She was also taking bupropion for anxiety but she can not tolerate it.   Review of Systems  Constitutional: Positive for fatigue. Negative for chills and fever.  HENT: Negative for ear pain, sinus pressure, sinus pain and trouble swallowing.   Respiratory: Negative for cough, chest tightness and shortness of breath.   Cardiovascular: Negative for chest pain, palpitations and leg swelling.  Gastrointestinal: Negative for abdominal pain and vomiting.  Neurological: Positive for dizziness and light-headedness. Negative for vertigo, seizures, syncope, speech difficulty and headaches.  Psychiatric/Behavioral: Positive for dysphoric mood (due to ongoing sx and feeling poorly). Negative for sleep disturbance.    Patient Active Problem List   Diagnosis Date Noted  . Vestibular migraine 11/27/2017  . Middle  ear effusion, left 11/27/2017  . 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 Diverticulitis of large intestine without perforation or abscess without bleeding   . Benign neoplasm of ascending colon   . Intestinal bypass or anastomosis status     Allergies  Allergen Reactions  . Codeine Nausea Only  . Levofloxacin Other (See Comments)    insomnia  . Effexor [Venlafaxine] Nausea Only    And dizziness  . Sulfa Antibiotics Rash  . Topamax [Topiramate] Other (See Comments)    sedation    Past Surgical History:  Procedure Laterality Date  . COLONOSCOPY WITH PROPOFOL N/A 07/27/2015   Procedure: COLONOSCOPY WITH PROPOFOL;  Surgeon: Lucilla Lame, MD;  Location: [place];  Service: Endoscopy;  Laterality: N/A;  . COLONOSCOPY WITH PROPOFOL N/A 03/14/2016   Procedure: COLONOSCOPY WITH PROPOFOL;  Surgeon: Lucilla Lame, MD;  Location: [place];  Service: Endoscopy;  Laterality: N/A;  Cannot arrive before 0830 AM  . COLOSTOMY REVERSAL N/A 09/26/2015   Procedure: COLOSTOMY REVERSAL / TAKE DOWN;  Surgeon: Hubbard Robinson, MD;  Location: [facility];  Service: General;  Laterality: N/A;  . LAPAROTOMY N/A 03/01/2015   Procedure: EXPLORATORY LAPAROTOMY, SIGMOID COLECTOMY, COLOSTOMY;  Surgeon: Hubbard Robinson, MD;  Location: [facility];  Service: General;  Laterality: N/A;  . POLYPECTOMY  03/14/2016   Procedure: POLYPECTOMY;  Surgeon: Lucilla Lame, MD;  Location: [place];  Service: Endoscopy;;    Social History   Tobacco Use  . Smoking status: Never Smoker  . Smokeless tobacco: Never Used  Substance Use  Topics  . Alcohol use: No    Alcohol/week: 0.0 standard drinks  . Drug use: No     Medication list has been reviewed and updated.  Current Meds  Medication Sig  . cholecalciferol (VITAMIN D) 1000 units tablet Take 1,000 Units by mouth daily.  . cyanocobalamin 1000 MCG tablet Take 1,000 mcg by mouth daily.  . fluticasone (FLONASE) 50 MCG/ACT  nasal spray Place 1 spray into both nostrils daily as needed for allergies or rhinitis. Reported on 03/27/2015  . mometasone (NASONEX) 50 MCG/ACT nasal spray Place 2 sprays into the nose daily.  . Multiple Vitamin (MULTIVITAMIN) tablet Take 1 tablet by mouth daily.  . polyethylene glycol (MIRALAX / GLYCOLAX) packet Take 17 g by mouth daily.  . vitamin C (ASCORBIC ACID) 500 MG tablet Take 500 mg by mouth daily.    PHQ 2/9 Scores 03/09/2018 12/28/2017 11/27/2017  PHQ - 2 Score 1 0 3  PHQ- 9 Score 7 0 12    Physical Exam  Constitutional: She is oriented to person, place, and time. She appears well-developed. No distress.  HENT:  Head: Normocephalic and atraumatic.  Eyes: Pupils are equal, round, and reactive to light. EOM are normal.  Neck: Normal range of motion. Neck supple.  Cardiovascular: Normal rate, regular rhythm and normal heart sounds.  Pulmonary/Chest: Effort normal and breath sounds normal. No respiratory distress.  Musculoskeletal: Normal range of motion.  Lymphadenopathy:    She has no cervical adenopathy.  Neurological: She is alert and oriented to person, place, and time. She has normal strength. No cranial nerve deficit or sensory deficit.  Skin: Skin is warm and dry. No rash noted.  Psychiatric: She has a normal mood and affect. Her speech is normal and behavior is normal. Thought content normal.  Nursing note and vitals reviewed.   BP 128/80   Pulse 76   Ht 5\' 4"  (1.626 m)   Wt 156 lb (70.8 kg)   SpO2 99%   BMI 26.78 kg/m   Assessment and Plan: 1. Episodic lightheadedness Will order Zio monitor to rule out arrhythmias Pt has mild orthostatic BP changes - liberalize sodium, increase fluids May need ENT evaluation for possible inner ear etiology - CBC with Differential/Platelet - Comprehensive metabolic panel  2. Mood disorder due to a general medical condition Will not add medications at this time - continue to monitor sx   Partially dictated using Dragon  software. Any errors are unintentional.  Halina Maidens, MD [facility]  03/09/2018

## 2018-03-10 ENCOUNTER — Encounter: Payer: Self-pay | Admitting: Emergency Medicine

## 2018-03-10 ENCOUNTER — Emergency Department
Admission: EM | Admit: 2018-03-10 | Discharge: 2018-03-10 | Disposition: A | Discharge status: Home / Self Care | Payer: Managed Care, Other (non HMO) | Attending: Emergency Medicine | Admitting: Emergency Medicine

## 2018-03-10 ENCOUNTER — Other Ambulatory Visit: Payer: Self-pay

## 2018-03-10 ENCOUNTER — Telehealth: Payer: Self-pay

## 2018-03-10 ENCOUNTER — Emergency Department: Payer: Managed Care, Other (non HMO)

## 2018-03-10 DIAGNOSIS — R51 Headache: Secondary | ICD-10-CM | POA: Diagnosis not present

## 2018-03-10 DIAGNOSIS — R110 Nausea: Secondary | ICD-10-CM | POA: Insufficient documentation

## 2018-03-10 DIAGNOSIS — R5383 Other fatigue: Secondary | ICD-10-CM | POA: Insufficient documentation

## 2018-03-10 DIAGNOSIS — Z79899 Other long term (current) drug therapy: Secondary | ICD-10-CM | POA: Insufficient documentation

## 2018-03-10 DIAGNOSIS — R42 Dizziness and giddiness: Secondary | ICD-10-CM | POA: Diagnosis present

## 2018-03-10 DIAGNOSIS — R55 Syncope and collapse: Secondary | ICD-10-CM | POA: Diagnosis not present

## 2018-03-10 LAB — URINALYSIS, COMPLETE (UACMP) WITH MICROSCOPIC
BILIRUBIN URINE: NEGATIVE
Bacteria, UA: NONE SEEN
GLUCOSE, UA: NEGATIVE mg/dL
HGB URINE DIPSTICK: NEGATIVE
Ketones, ur: NEGATIVE mg/dL
LEUKOCYTES UA: NEGATIVE
Nitrite: NEGATIVE
Protein, ur: NEGATIVE mg/dL
Specific Gravity, Urine: 1.001 — ABNORMAL LOW (ref 1.005–1.030)
Squamous Epithelial / LPF: NONE SEEN (ref 0–5)
pH: 7 (ref 5.0–8.0)

## 2018-03-10 LAB — CBC
HCT: 42.7 % (ref 36.0–46.0)
Hemoglobin: 14.7 g/dL (ref 12.0–15.0)
MCH: 32.5 pg (ref 26.0–34.0)
MCHC: 34.4 g/dL (ref 30.0–36.0)
MCV: 94.3 fL (ref 80.0–100.0)
PLATELETS: 295 10*3/uL (ref 150–400)
RBC: 4.53 MIL/uL (ref 3.87–5.11)
RDW: 12.4 % (ref 11.5–15.5)
WBC: 8.2 10*3/uL (ref 4.0–10.5)
nRBC: 0 % (ref 0.0–0.2)

## 2018-03-10 LAB — COMPREHENSIVE METABOLIC PANEL
A/G RATIO: 1.8 (ref 1.2–2.2)
ALK PHOS: 81 IU/L (ref 39–117)
ALT: 10 IU/L (ref 0–32)
AST: 17 IU/L (ref 0–40)
Albumin: 4.7 g/dL (ref 3.6–4.8)
BILIRUBIN TOTAL: 0.6 mg/dL (ref 0.0–1.2)
BUN/Creatinine Ratio: 14 (ref 12–28)
BUN: 12 mg/dL (ref 8–27)
CHLORIDE: 103 mmol/L (ref 96–106)
CO2: 23 mmol/L (ref 20–29)
Calcium: 9.8 mg/dL (ref 8.7–10.3)
Creatinine, Ser: 0.88 mg/dL (ref 0.57–1.00)
GFR calc Af Amer: 81 mL/min/{1.73_m2} (ref >59)
GFR, EST NON AFRICAN AMERICAN: 70 mL/min/{1.73_m2} (ref >59)
GLOBULIN, TOTAL: 2.6 g/dL (ref 1.5–4.5)
Glucose: 87 mg/dL (ref 65–99)
POTASSIUM: 4.7 mmol/L (ref 3.5–5.2)
SODIUM: 144 mmol/L (ref 134–144)
Total Protein: 7.3 g/dL (ref 6.0–8.5)

## 2018-03-10 LAB — CBC WITH DIFFERENTIAL/PLATELET
Basophils Absolute: 0.1 10*3/uL (ref 0.0–0.2)
Basos: 1 %
EOS (ABSOLUTE): 0.3 10*3/uL (ref 0.0–0.4)
Eos: 6 %
HEMATOCRIT: 41.1 % (ref 34.0–46.6)
Hemoglobin: 14.1 g/dL (ref 11.1–15.9)
IMMATURE GRANULOCYTES: 0 %
Immature Grans (Abs): 0 10*3/uL (ref 0.0–0.1)
LYMPHS ABS: 1.8 10*3/uL (ref 0.7–3.1)
Lymphs: 30 %
MCH: 32.4 pg (ref 26.6–33.0)
MCHC: 34.3 g/dL (ref 31.5–35.7)
MCV: 95 fL (ref 79–97)
MONOS ABS: 0.6 10*3/uL (ref 0.1–0.9)
Monocytes: 9 %
NEUTROS PCT: 54 %
Neutrophils Absolute: 3.2 10*3/uL (ref 1.4–7.0)
PLATELETS: 321 10*3/uL (ref 150–450)
RBC: 4.35 x10E6/uL (ref 3.77–5.28)
RDW: 12.2 % — AB (ref 12.3–15.4)
WBC: 5.9 10*3/uL (ref 3.4–10.8)

## 2018-03-10 LAB — BASIC METABOLIC PANEL
Anion gap: 10 (ref 5–15)
BUN: 11 mg/dL (ref 8–23)
CALCIUM: 9.5 mg/dL (ref 8.9–10.3)
CO2: 25 mmol/L (ref 22–32)
CREATININE: 0.66 mg/dL (ref 0.44–1.00)
Chloride: 106 mmol/L (ref 98–111)
GFR calc Af Amer: >60 mL/min (ref >60)
Glucose, Bld: 105 mg/dL — ABNORMAL HIGH (ref 70–99)
Potassium: 3.8 mmol/L (ref 3.5–5.1)
SODIUM: 141 mmol/L (ref 135–145)

## 2018-03-10 LAB — TROPONIN I
Troponin I: <0.03 ng/mL (ref <0.03)
Troponin I: <0.03 ng/mL (ref <0.03)

## 2018-03-10 MED ORDER — ONDANSETRON HCL 4 MG/2ML IJ SOLN
4.0000 mg | Freq: Once | INTRAMUSCULAR | Status: AC
  Administered 2018-03-10: 4 mg via INTRAVENOUS
  Filled 2018-03-10: qty 2

## 2018-03-10 MED ORDER — SODIUM CHLORIDE 0.9 % IV BOLUS
1000.0000 mL | Freq: Once | INTRAVENOUS | Status: AC
  Administered 2018-03-10: 1000 mL via INTRAVENOUS

## 2018-03-10 NOTE — ED Notes (Signed)
Pt had syncopal episode while sitting down approx 45 seconds while on phone with husband. Pt alert and oriented X4, active, cooperative, pt in NAD. RR even and unlabored, color WNL.  Wears cardiac monitor that was placed yesterday by PCP for near syncope.

## 2018-03-10 NOTE — ED Provider Notes (Signed)
[facility] Emergency Department Provider Note  ____________________________________________  Time seen: Approximately 4:24 PM  I have reviewed the triage vital signs and the nursing notes.   HISTORY  Chief Complaint Dizziness   HPI Natalie [name] is a 62 y.o. female with history of vestibular migraines and vertigo who presents for evaluation of dizziness.  Patient reports having history of vertigo over the last 2 years for which she has been worked up by ENT and her primary care doctor.  Over the last week she has been having episodes of dizziness which are very different.  These episodes she feels lightheaded like she is going to pass out.  Her initial episodes of vertigo felt like patient was out of balance but not lightheaded.  She saw her primary care doctor yesterday and had normal labs done.  Her PCP placed a ZIO Patch which patient is currently wearing.  She reports that this morning she was feeling great.  She was at work sitting at her desk talking to her husband when she started feeling like she was going to pass out.  She reports a brief loss of consciousness episode.  She did not fall from the chair injuring herself.  Since that episode happened she is been feeling very fatigued and nauseous.  She initially had a mild occipital headache after the syncopal event however that has resolved.  She denies facial droop, slurred speech, unilateral weakness or numbness, chest pain or shortness of breath, abdominal pain or back pain.  She denies any prior history of syncope.  She has a brother with a history of bradycardia who received a pacemaker but no history of ischemic heart disease or blood clots.  Past Medical History:  Diagnosis Date  . Benign neoplasm of sigmoid colon   . Chronic constipation   . Chronic sinusitis   . Cystitis   . Diverticulitis   . Perforated bowel ([place])   . Pleural effusion on right 2016  . Preventative health care 05/24/2015     Patient Active Problem List   Diagnosis Date Noted  . Vestibular migraine 11/27/2017  . Middle ear effusion, left 11/27/2017  . 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 Diverticulitis of large intestine without perforation or abscess without bleeding   . Benign neoplasm of ascending colon   . Intestinal bypass or anastomosis status     Past Surgical History:  Procedure Laterality Date  . COLONOSCOPY WITH PROPOFOL N/A 07/27/2015   Procedure: COLONOSCOPY WITH PROPOFOL;  Surgeon: Lucilla Lame, MD;  Location: [place];  Service: Endoscopy;  Laterality: N/A;  . COLONOSCOPY WITH PROPOFOL N/A 03/14/2016   Procedure: COLONOSCOPY WITH PROPOFOL;  Surgeon: Lucilla Lame, MD;  Location: [place];  Service: Endoscopy;  Laterality: N/A;  Cannot arrive before 0830 AM  . COLOSTOMY REVERSAL N/A 09/26/2015   Procedure: COLOSTOMY REVERSAL / TAKE DOWN;  Surgeon: Hubbard Robinson, MD;  Location: [facility];  Service: General;  Laterality: N/A;  . LAPAROTOMY N/A 03/01/2015   Procedure: EXPLORATORY LAPAROTOMY, SIGMOID COLECTOMY, COLOSTOMY;  Surgeon: Hubbard Robinson, MD;  Location: [facility];  Service: General;  Laterality: N/A;  . POLYPECTOMY  03/14/2016   Procedure: POLYPECTOMY;  Surgeon: Lucilla Lame, MD;  Location: [place];  Service: Endoscopy;;    Prior to Admission medications   Medication Sig Start Date End Date Taking? Authorizing Provider  azelastine (ASTELIN) 0.1 % nasal spray Place 1 spray into both nostrils 2 (two) times daily. Use  in each nostril as directed   Yes [provider]  cholecalciferol (VITAMIN D) 1000 units tablet Take 1,000 Units by mouth daily.   Yes [provider]  cyanocobalamin 1000 MCG tablet Take 1,000 mcg by mouth daily.   Yes [provider]  fluticasone (FLONASE) 50 MCG/ACT nasal spray Place 1 spray into both nostrils daily as needed for allergies or rhinitis. Reported on 03/27/2015 11/27/17   Yes Glean Hess, MD  Multiple Vitamin (MULTIVITAMIN) tablet Take 1 tablet by mouth daily.   Yes [provider]  polyethylene glycol (MIRALAX / GLYCOLAX) packet Take 17 g by mouth daily.   Yes [provider]  vitamin C (ASCORBIC ACID) 500 MG tablet Take 500 mg by mouth daily.   Yes [provider]    Allergies Codeine; Levofloxacin; Effexor [venlafaxine]; Sulfa antibiotics; and Topamax [topiramate]  Family History  Problem Relation Age of Onset  . Stroke Mother   . Arthritis Father   . Lung cancer Father   . Colon cancer Maternal Aunt   . Breast cancer Paternal Aunt     Social History Social History   Tobacco Use  . Smoking status: Never Smoker  . Smokeless tobacco: Never Used  Substance Use Topics  . Alcohol use: No    Alcohol/week: 0.0 standard drinks  . Drug use: No    Review of Systems  Constitutional: Negative for fever. + syncope Eyes: Negative for visual changes. ENT: Negative for sore throat. Neck: No neck pain  Cardiovascular: Negative for chest pain. Respiratory: Negative for shortness of breath. Gastrointestinal: Negative for abdominal pain, vomiting or diarrhea. Genitourinary: Negative for dysuria. Musculoskeletal: Negative for back pain. Skin: Negative for rash. Neurological: Negative for headaches, weakness or numbness. Psych: No SI or HI  ____________________________________________   PHYSICAL EXAM:  VITAL SIGNS: ED Triage Vitals  Enc Vitals Group     BP 03/10/18 1403 127/64     Pulse Rate 03/10/18 1403 92     Resp 03/10/18 1403 16     Temp 03/10/18 1403 97.7 F (36.5 C)     Temp Source 03/10/18 1403 Oral     SpO2 03/10/18 1403 100 %     Weight 03/10/18 1405 155 lb (70.3 kg)     Height 03/10/18 1405 5\' 4"  (1.626 m)     Head Circumference --      Peak Flow --      Pain Score 03/10/18 1404 0     Pain Loc --      Pain Edu? --      Excl. in [place]? --     Constitutional: Alert and oriented. Well appearing  and in no apparent distress. HEENT:      Head: Normocephalic and atraumatic.         Eyes: Conjunctivae are normal. Sclera is non-icteric.       Mouth/Throat: Mucous membranes are moist.       Neck: Supple with no signs of meningismus. Cardiovascular: Regular rate and rhythm. No murmurs, gallops, or rubs. 2+ symmetrical distal pulses are present in all extremities. No JVD. Respiratory: Normal respiratory effort. Lungs are clear to auscultation bilaterally. No wheezes, crackles, or rhonchi.  Gastrointestinal: Soft, non tender, and non distended with positive bowel sounds. No rebound or guarding. Musculoskeletal: Nontender with normal range of motion in all extremities. No edema, cyanosis, or erythema of extremities. Neurologic: Normal speech and language. Face is symmetric.  Extraocular movements are intact, pupils are equal round and reactive with no nystagmus, intact  to strength and sensation x4, no pronator drift, no dysmetria. Skin: Skin is warm, dry and intact. No rash noted. Psychiatric: Mood and affect are normal. Speech and behavior are normal.  ____________________________________________   LABS (all labs ordered are listed, but only abnormal results are displayed)  Labs Reviewed  BASIC METABOLIC PANEL - Abnormal; Notable for the following components:      Result Value   Glucose, Bld 105 (*)    All other components within normal limits  URINALYSIS, COMPLETE (UACMP) WITH MICROSCOPIC - Abnormal; Notable for the following components:   Color, Urine COLORLESS (*)    APPearance CLEAR (*)    Specific Gravity, Urine 1.001 (*)    All other components within normal limits  CBC  TROPONIN I  TROPONIN I   ____________________________________________  EKG  ED ECG REPORT I, Rudene Re, the attending physician, personally viewed and interpreted this ECG.  Normal sinus rhythm, normal intervals, normal axis, no STE or depressions, no evidence of HOCM, AV block, delta wave, ARVD,  prolonged QTc, WPW, or Brugada.   ____________________________________________  RADIOLOGY  I have personally reviewed the images performed during this visit and I agree with the Radiologist's read.   Interpretation by Radiologist:  Ct Head Wo Contrast  Result Date: 03/10/2018 CLINICAL DATA:  Intermittent dizziness with syncope. EXAM: CT HEAD WITHOUT CONTRAST TECHNIQUE: Contiguous axial images were obtained from the base of the skull through the vertex without intravenous contrast. COMPARISON:  MRI 01/02/2017 FINDINGS: BRAIN: The ventricles and sulci are normal. No intraparenchymal hemorrhage, mass effect nor midline shift. No acute large vascular territory infarcts. Grey-white matter distinction is maintained. The basal ganglia are unremarkable. No abnormal extra-axial fluid collections. Basal cisterns are not effaced and midline. The brainstem and cerebellar hemispheres are without acute abnormalities. VASCULAR: No hyperdense vessel sign.  No unexpected calcifications. SKULL/SOFT TISSUES: No skull fracture. No significant soft tissue swelling. ORBITS/SINUSES: The included ocular globes and orbital contents are normal.The mastoid air cells are clear. The included paranasal sinuses are well-aerated. OTHER: None. IMPRESSION: Normal age-appropriate head CT. Electronically Signed   By: Ashley Royalty M.D.   On: 03/10/2018 17:20      ____________________________________________   PROCEDURES  Procedure(s) performed: None Procedures Critical Care performed:  None ____________________________________________   INITIAL IMPRESSION / ASSESSMENT AND PLAN / ED COURSE  62 y.o. female with history of vestibular migraines and vertigo who presents for evaluation of several episodes of lightheadedness over the last week and a brief syncopal episode today.  Patient is extremely well-appearing with normal vital signs, completely neurologically intact.  EKG with no evidence of dysrhythmias or ischemia.  Luckily  patient is wearing a ZIO patch which was placed yesterday by her PCP for these episodes of lightheadedness.  Patient will be monitor on telemetry for any further episodes to rule out dysrhythmias.  Will check orthostatic vital signs.  Will check labs to rule out AKI, dehydration, electrolyte abnormalities.  We will get a head CT to rule out intracranial etiologies such as a brain mass.  No signs of stroke at this time.  No thunderclap headache.    _________________________ 6:14 PM on 03/10/2018 -----------------------------------------  Patient monitored for a few hours in the ED with no episodes of dysrhythmias or further episodes of syncope.  Remains completely asymptomatic.  Per [facility] syncope rule patient is very low risk.  Had 2 sets of cardiac enzymes which were negative.  Labs showing no evidence of anemia or electrolyte abnormalities.  Head CT negative for mass  or bleed. UA negative for UTI.  Vital signs negative.  Patient received fluids.  Recommended increase oral hydration and close follow-up with primary care doctor for evaluation of zio patch.  Discussed standard return precautions with patient   As part of my medical decision making, I reviewed the following data within the [place] notes reviewed and incorporated, Labs reviewed , EKG interpreted , Old EKG reviewed, Old chart reviewed, Radiograph reviewed , Notes from prior ED visits and [place] Controlled Substance Database    Pertinent labs & imaging results that were available during my care of the patient were reviewed by me and considered in my medical decision making (see chart for details).    ____________________________________________   FINAL CLINICAL IMPRESSION(S) / ED DIAGNOSES  Final diagnoses:  Syncope, unspecified syncope type      NEW MEDICATIONS STARTED DURING THIS VISIT:  ED Discharge Orders    None       Note:  This document was prepared using Dragon voice recognition  software and may include unintentional dictation errors.    Rudene Re, MD 03/10/18 [phone]

## 2018-03-10 NOTE — Telephone Encounter (Signed)
Patient called stating she was sitting on the phone about 10 mins ago talking to her husband and she felt faint. She had enough time to tell her husband she was going to pass out and then she did. She said her husband was yelling at her on the phone and calling her name and she eventually came to. Patient was wearing the heart monitor during this episode. Spoke with Dr Army Melia. She recommended for patient to go to the ER to be evaluated. She verbalized understanding and said " I will call somebody to pick me up and take me."

## 2018-03-10 NOTE — Progress Notes (Signed)
Patient informed of normal labs. Will call someone to pick her up and take her to ER as she had episode of passing out while sitting on the phone with husband.

## 2018-03-10 NOTE — ED Notes (Signed)
Pt alert and oriented X4, active, cooperative, pt in NAD. RR even and unlabored, color WNL.  Pt informed to return if any life threatening symptoms occur.  Discharge and followup instructions reviewed. Ambulates safely. 

## 2018-03-10 NOTE — ED Notes (Signed)
Patient transported to CT 

## 2018-03-10 NOTE — ED Triage Notes (Signed)
Pt in via POV, reports ongoing intermittent dizziness w/ syncopal episode today, denies falling out of chair, denies hitting head.  Pt currently wearing Zio XT Rythym monitor.  Pt contacted PCP, advised to be evaluated here.  Vitals WDL at this time.

## 2018-03-11 ENCOUNTER — Telehealth: Payer: Self-pay

## 2018-03-11 NOTE — Telephone Encounter (Signed)
Patient called to let you know that she seen Er last night and they did CT and EKG which all came back normal. She said the hospital told her to call and ask you should she go ahead and send the ZIO in since she had a fainting episode or should she continue to wear it for the full 10 days?  Also, she stated she woke up this morning feeling nauseous and having heartburn. What is okay for her to take OTC for this?  Please Advise.

## 2018-03-11 NOTE — Telephone Encounter (Signed)
I would recommend that she wear it for another couple of days - maybe send it in at least until Monday.

## 2018-03-12 ENCOUNTER — Telehealth: Payer: Self-pay

## 2018-03-12 NOTE — Telephone Encounter (Signed)
Patient informed. Will try Pepcid over the counter for acid reflux. Pt is wondering if this is connected to past surgery for colon resection and may want to see GI in the future. Informed her to at least finish wearing the heart monitor until Monday and we will call her when these results come back. If we find nothing with ZIO patch, then informed her she may come back and discuss referral to GI to follow up on past colon resection and GERD.

## 2018-03-12 NOTE — Telephone Encounter (Signed)
Pt would like a call from [place]. She has a few issues going on and would like to know if she should make a appt to see Dr. Allen Norris or does she need another specialty doctor? Pls call pt at your convenience.

## 2018-03-12 NOTE — Telephone Encounter (Signed)
Yes, she will need an appt. Her last office appt was 10/18/15 with Allen Norris. Could you call her back and schedule? Thanks!!

## 2018-03-19 ENCOUNTER — Other Ambulatory Visit: Payer: Self-pay | Admitting: Internal Medicine

## 2018-03-19 DIAGNOSIS — I499 Cardiac arrhythmia, unspecified: Secondary | ICD-10-CM

## 2018-03-19 DIAGNOSIS — R42 Dizziness and giddiness: Secondary | ICD-10-CM

## 2018-03-19 NOTE — Progress Notes (Unsigned)
cardiac

## 2018-03-22 DIAGNOSIS — I471 Supraventricular tachycardia: Secondary | ICD-10-CM | POA: Insufficient documentation

## 2018-05-31 ENCOUNTER — Encounter: Payer: Self-pay | Admitting: Internal Medicine

## 2018-05-31 ENCOUNTER — Other Ambulatory Visit: Payer: Self-pay

## 2018-05-31 ENCOUNTER — Ambulatory Visit (INDEPENDENT_AMBULATORY_CARE_PROVIDER_SITE_OTHER): Payer: Managed Care, Other (non HMO) | Admitting: Internal Medicine

## 2018-05-31 ENCOUNTER — Other Ambulatory Visit (HOSPITAL_COMMUNITY)
Admission: RE | Admit: 2018-05-31 | Discharge: 2018-05-31 | Disposition: A | Discharge status: Home / Self Care | Payer: Managed Care, Other (non HMO) | Source: Ambulatory Visit | Attending: Internal Medicine | Admitting: Internal Medicine

## 2018-05-31 VITALS — BP 124/82 | HR 76 | Ht 64.0 in | Wt 167.0 lb

## 2018-05-31 DIAGNOSIS — Z124 Encounter for screening for malignant neoplasm of cervix: Secondary | ICD-10-CM | POA: Diagnosis not present

## 2018-05-31 DIAGNOSIS — Z1159 Encounter for screening for other viral diseases: Secondary | ICD-10-CM | POA: Diagnosis not present

## 2018-05-31 DIAGNOSIS — R42 Dizziness and giddiness: Secondary | ICD-10-CM

## 2018-05-31 DIAGNOSIS — R0981 Nasal congestion: Secondary | ICD-10-CM | POA: Diagnosis not present

## 2018-05-31 DIAGNOSIS — Z0000 Encounter for general adult medical examination without abnormal findings: Secondary | ICD-10-CM

## 2018-05-31 DIAGNOSIS — R55 Syncope and collapse: Secondary | ICD-10-CM | POA: Diagnosis not present

## 2018-05-31 DIAGNOSIS — Z1231 Encounter for screening mammogram for malignant neoplasm of breast: Secondary | ICD-10-CM

## 2018-05-31 LAB — POCT URINALYSIS DIPSTICK
Bilirubin, UA: NEGATIVE
Blood, UA: NEGATIVE
Glucose, UA: NEGATIVE
Ketones, UA: NEGATIVE
Leukocytes, UA: NEGATIVE
NITRITE UA: NEGATIVE
PROTEIN UA: NEGATIVE
Spec Grav, UA: 1.025 (ref 1.010–1.025)
Urobilinogen, UA: 0.2 E.U./dL
pH, UA: 6.5 (ref 5.0–8.0)

## 2018-05-31 NOTE — Progress Notes (Signed)
Date:  05/31/2018   Name:  Natalie [name]   DOB:  29-Jan-1956   MRN:  [id number]   Chief Complaint: Annual Exam (Breast Exam. Last Papsmear if normal.) and Hypotension (Wants to know if low bp could be causing her to pass out. Last episode was 2 wks ago. Wants to know if she can try a BP med to increase her pressure. ) Natalie [name] is a 63 y.o. female who presents today for her Complete Annual Exam. She feels fairly well. She reports exercising at home on a treadmill. She reports she is sleeping fairly well. She is due for a Pap smear.  She has not done mammogram due to issues with transportation and ongoing syncope and pre-syncope. Colonoscopy is due in 2 years.  Immunization are up to date.  Loss of Consciousness  This is a recurrent problem. She lost consciousness for a period of less than 1 minute. The symptoms are aggravated by unknown factors. Associated symptoms include light-headedness and palpitations (improved with stopping caffeine). Pertinent negatives include no abdominal pain, chest pain, dizziness, fever, headaches or vomiting. She has tried drinking for the symptoms. The treatment provided no relief. Her past medical history is significant for arrhythmia and vertigo. There is no history of HTN or seizures. seen by Neurology - felt to possibly have vestibular migraines.  Referred to a specialist - appt next month.  Recently seen by cardiology after Zio monitor showed brief SVT.  Stress testing and ECHO were normal.   Nasal congestion - using Astelin or Nasocort PRN.  No recent infections.  Did not tolerate singulair.  Review of Systems  Constitutional: Negative for chills, fatigue and fever.  HENT: Negative for congestion, hearing loss, tinnitus, trouble swallowing and voice change.   Eyes: Negative for visual disturbance.  Respiratory: Negative for cough, chest tightness, shortness of breath and wheezing.   Cardiovascular: Positive for palpitations (improved with stopping  caffeine) and syncope. Negative for chest pain and leg swelling.  Gastrointestinal: Negative for abdominal pain, constipation, diarrhea and vomiting.  Endocrine: Negative for polydipsia and polyuria.  Genitourinary: Negative for dysuria, frequency, genital sores, vaginal bleeding and vaginal discharge.  Musculoskeletal: Negative for arthralgias, gait problem and joint swelling.  Skin: Negative for color change and rash.  Neurological: Positive for syncope and light-headedness. Negative for dizziness, tremors and headaches.  Hematological: Negative for adenopathy. Does not bruise/bleed easily.  Psychiatric/Behavioral: Negative for dysphoric mood and sleep disturbance. The patient is not nervous/anxious.     Patient Active Problem List   Diagnosis Date Noted  . Supraventricular tachycardia ([place]) 03/22/2018  . Vestibular migraine 11/27/2017  . Middle ear effusion, left 11/27/2017  . 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 Diverticulitis of large intestine without perforation or abscess without bleeding   . Benign neoplasm of ascending colon   . Intestinal bypass or anastomosis status     Allergies  Allergen Reactions  . Codeine Nausea Only  . Levofloxacin Other (See Comments)    insomnia  . Effexor [Venlafaxine] Nausea Only    And dizziness  . Sulfa Antibiotics Rash  . Topamax [Topiramate] Other (See Comments)    sedation    Past Surgical History:  Procedure Laterality Date  . COLONOSCOPY WITH PROPOFOL N/A 07/27/2015   Procedure: COLONOSCOPY WITH PROPOFOL;  Surgeon: Lucilla Lame, MD;  Location: [place];  Service: Endoscopy;  Laterality: N/A;  . COLONOSCOPY WITH PROPOFOL N/A 03/14/2016   Procedure: COLONOSCOPY WITH PROPOFOL;  Surgeon: Evangeline Gula  Allen Norris, MD;  Location: [place];  Service: Endoscopy;  Laterality: N/A;  Cannot arrive before 0830 AM  . COLOSTOMY REVERSAL N/A 09/26/2015   Procedure: COLOSTOMY REVERSAL / TAKE DOWN;  Surgeon:  Hubbard Robinson, MD;  Location: [facility];  Service: General;  Laterality: N/A;  . LAPAROTOMY N/A 03/01/2015   Procedure: EXPLORATORY LAPAROTOMY, SIGMOID COLECTOMY, COLOSTOMY;  Surgeon: Hubbard Robinson, MD;  Location: [facility];  Service: General;  Laterality: N/A;  . POLYPECTOMY  03/14/2016   Procedure: POLYPECTOMY;  Surgeon: Lucilla Lame, MD;  Location: [place];  Service: Endoscopy;;    Social History   Tobacco Use  . Smoking status: Never Smoker  . Smokeless tobacco: Never Used  Substance Use Topics  . Alcohol use: No    Alcohol/week: 0.0 standard drinks  . Drug use: No     Medication list has been reviewed and updated.  Current Meds  Medication Sig  . azelastine (ASTELIN) 0.1 % nasal spray Place 1 spray into both nostrils 2 (two) times daily. Use in each nostril as directed  . cholecalciferol (VITAMIN D) 1000 units tablet Take 1,000 Units by mouth daily.  . cyanocobalamin 1000 MCG tablet Take 1,000 mcg by mouth daily.  . fluticasone (FLONASE) 50 MCG/ACT nasal spray Place 1 spray into both nostrils daily as needed for allergies or rhinitis. Reported on 03/27/2015  . Multiple Vitamin (MULTIVITAMIN) tablet Take 1 tablet by mouth daily.  . polyethylene glycol (MIRALAX / GLYCOLAX) packet Take 17 g by mouth daily.  . vitamin C (ASCORBIC ACID) 500 MG tablet Take 500 mg by mouth daily.    PHQ 2/9 Scores 05/31/2018 03/09/2018 12/28/2017 11/27/2017  PHQ - 2 Score 0 1 0 3  PHQ- 9 Score - 7 0 12   Wt Readings from Last 3 Encounters:  05/31/18 167 lb (75.8 kg)  03/10/18 155 lb (70.3 kg)  03/09/18 156 lb (70.8 kg)    Physical Exam Vitals signs and nursing note reviewed.  Constitutional:      General: She is not in acute distress.    Appearance: She is well-developed.  HENT:     Head: Normocephalic and atraumatic.     Right Ear: Tympanic membrane and ear canal normal.     Left Ear: Tympanic membrane and ear canal normal.     Nose: Nose normal.     Right Sinus: No  maxillary sinus tenderness.     Left Sinus: No maxillary sinus tenderness.     Mouth/Throat:     Mouth: Mucous membranes are moist.     Pharynx: Uvula midline.  Eyes:     General: No scleral icterus.       Right eye: No discharge.        Left eye: No discharge.     Conjunctiva/sclera: Conjunctivae normal.  Neck:     Musculoskeletal: Normal range of motion. No erythema.     Thyroid: No thyromegaly.     Vascular: No carotid bruit.  Cardiovascular:     Rate and Rhythm: Normal rate and regular rhythm.     Pulses: Normal pulses.     Heart sounds: Normal heart sounds.  Pulmonary:     Effort: Pulmonary effort is normal. No respiratory distress.     Breath sounds: No wheezing.  Chest:     Breasts:        Right: No mass, nipple discharge, skin change or tenderness.        Left: No mass, nipple discharge, skin change or tenderness.  Abdominal:     General: Bowel sounds are normal.     Palpations: Abdomen is soft.     Tenderness: There is no abdominal tenderness.     Hernia: There is no hernia in the right inguinal area or left inguinal area.  Genitourinary:    Labia:        Right: No rash, tenderness or lesion.        Left: No rash, tenderness or lesion.      Vagina: Normal.     Cervix: Normal.     Uterus: Normal.      Adnexa: Right adnexa normal and left adnexa normal.  Musculoskeletal: Normal range of motion.  Lymphadenopathy:     Cervical: No cervical adenopathy.  Skin:    General: Skin is warm and dry.     Findings: No rash.  Neurological:     Mental Status: She is alert and oriented to person, place, and time.     Cranial Nerves: No cranial nerve deficit.     Sensory: No sensory deficit.     Deep Tendon Reflexes: Reflexes are normal and symmetric.  Psychiatric:        Speech: Speech normal.        Behavior: Behavior normal.        Thought Content: Thought content normal.    Wt Readings from Last 3 Encounters:  05/31/18 167 lb (75.8 kg)  03/10/18 155 lb (70.3 kg)    03/09/18 156 lb (70.8 kg)     BP 124/82   Pulse 76   Ht 5\' 4"  (1.626 m)   Wt 167 lb (75.8 kg)   SpO2 98%   BMI 28.67 kg/m   Assessment and Plan: 1. Annual physical exam Continue healthy diet, exercise as tolerated - POCT urinalysis dipstick - Lipid panel - TSH  2. Encounter for screening mammogram for breast cancer Schedule at her convenience - [place]; Future  3. Need for hepatitis C screening test - Hepatitis C antibody  4. Episodic lightheadedness Seeing ENT specialist - if no dx, would see Neurology again  5. Nasal sinus congestion Continue sprays as needed  6. Syncope, unspecified syncope type As above Continue adequate fluid intake - Comprehensive metabolic panel  7. Encounter for Papanicolaou smear for cervical cancer screening - Cytology - PAP   Partially dictated using Dragon software. Any errors are unintentional.  Halina Maidens, MD [facility]  05/31/2018

## 2018-06-01 LAB — COMPREHENSIVE METABOLIC PANEL
ALBUMIN: 4.4 g/dL (ref 3.8–4.8)
ALT: 15 IU/L (ref 0–32)
AST: 18 IU/L (ref 0–40)
Albumin/Globulin Ratio: 1.6 (ref 1.2–2.2)
Alkaline Phosphatase: 86 IU/L (ref 39–117)
BUN/Creatinine Ratio: 18 (ref 12–28)
BUN: 15 mg/dL (ref 8–27)
Bilirubin Total: 0.5 mg/dL (ref 0.0–1.2)
CO2: 23 mmol/L (ref 20–29)
Calcium: 10.1 mg/dL (ref 8.7–10.3)
Chloride: 104 mmol/L (ref 96–106)
Creatinine, Ser: 0.82 mg/dL (ref 0.57–1.00)
GFR calc Af Amer: 87 mL/min/{1.73_m2} (ref >59)
GFR calc non Af Amer: 76 mL/min/{1.73_m2} (ref >59)
Globulin, Total: 2.8 g/dL (ref 1.5–4.5)
Glucose: 87 mg/dL (ref 65–99)
Potassium: 4.5 mmol/L (ref 3.5–5.2)
Sodium: 141 mmol/L (ref 134–144)
Total Protein: 7.2 g/dL (ref 6.0–8.5)

## 2018-06-01 LAB — LIPID PANEL
Chol/HDL Ratio: 3.3 ratio (ref 0.0–4.4)
Cholesterol, Total: 210 mg/dL — ABNORMAL HIGH (ref 100–199)
HDL: 63 mg/dL (ref >39)
LDL Calculated: 125 mg/dL — ABNORMAL HIGH (ref 0–99)
Triglycerides: 108 mg/dL (ref 0–149)
VLDL Cholesterol Cal: 22 mg/dL (ref 5–40)

## 2018-06-01 LAB — TSH: TSH: 1.83 u[IU]/mL (ref 0.450–4.500)

## 2018-06-01 LAB — HEPATITIS C ANTIBODY: Hep C Virus Ab: <0.1 s/co ratio (ref 0.0–0.9)

## 2018-06-02 LAB — CYTOLOGY - PAP
Diagnosis: NEGATIVE
HPV (WINDOPATH): NOT DETECTED

## 2018-06-25 ENCOUNTER — Encounter: Payer: Self-pay | Admitting: Internal Medicine

## 2018-06-25 DIAGNOSIS — H8103 Meniere's disease, bilateral: Secondary | ICD-10-CM | POA: Insufficient documentation

## 2019-01-24 ENCOUNTER — Other Ambulatory Visit: Payer: Self-pay | Admitting: Internal Medicine

## 2019-01-24 DIAGNOSIS — R0981 Nasal congestion: Secondary | ICD-10-CM

## 2019-02-17 ENCOUNTER — Ambulatory Visit
Admission: RE | Admit: 2019-02-17 | Discharge: 2019-02-17 | Disposition: A | Discharge status: Home / Self Care | Payer: Managed Care, Other (non HMO) | Source: Ambulatory Visit | Attending: Internal Medicine | Admitting: Internal Medicine

## 2019-02-17 DIAGNOSIS — Z1231 Encounter for screening mammogram for malignant neoplasm of breast: Secondary | ICD-10-CM | POA: Diagnosis not present

## 2019-02-18 ENCOUNTER — Other Ambulatory Visit: Payer: Self-pay | Admitting: Internal Medicine

## 2019-02-18 DIAGNOSIS — R928 Other abnormal and inconclusive findings on diagnostic imaging of breast: Secondary | ICD-10-CM

## 2019-03-07 ENCOUNTER — Ambulatory Visit
Admission: RE | Admit: 2019-03-07 | Discharge: 2019-03-07 | Disposition: A | Discharge status: Home / Self Care | Payer: Managed Care, Other (non HMO) | Source: Ambulatory Visit | Attending: Internal Medicine | Admitting: Internal Medicine

## 2019-03-07 DIAGNOSIS — R928 Other abnormal and inconclusive findings on diagnostic imaging of breast: Secondary | ICD-10-CM

## 2019-03-08 ENCOUNTER — Ambulatory Visit: Payer: 59

## 2019-03-08 ENCOUNTER — Other Ambulatory Visit: Payer: Self-pay | Admitting: Internal Medicine

## 2019-03-08 ENCOUNTER — Other Ambulatory Visit: Payer: 59

## 2019-03-08 DIAGNOSIS — R928 Other abnormal and inconclusive findings on diagnostic imaging of breast: Secondary | ICD-10-CM

## 2019-03-08 DIAGNOSIS — N6310 Unspecified lump in the right breast, unspecified quadrant: Secondary | ICD-10-CM

## 2019-03-15 ENCOUNTER — Ambulatory Visit
Admission: RE | Admit: 2019-03-15 | Discharge: 2019-03-15 | Disposition: A | Discharge status: Home / Self Care | Payer: Medicare HMO | Source: Ambulatory Visit | Attending: Internal Medicine | Admitting: Internal Medicine

## 2019-03-15 ENCOUNTER — Other Ambulatory Visit: Payer: Self-pay

## 2019-03-15 DIAGNOSIS — N6310 Unspecified lump in the right breast, unspecified quadrant: Secondary | ICD-10-CM | POA: Insufficient documentation

## 2019-03-15 DIAGNOSIS — N6314 Unspecified lump in the right breast, lower inner quadrant: Secondary | ICD-10-CM | POA: Diagnosis not present

## 2019-03-15 DIAGNOSIS — R928 Other abnormal and inconclusive findings on diagnostic imaging of breast: Secondary | ICD-10-CM

## 2019-03-15 DIAGNOSIS — Z7689 Persons encountering health services in other specified circumstances: Secondary | ICD-10-CM | POA: Diagnosis not present

## 2019-03-15 HISTORY — PX: BREAST BIOPSY: SHX20

## 2019-03-16 LAB — SURGICAL PATHOLOGY

## 2019-03-17 DIAGNOSIS — R69 Illness, unspecified: Secondary | ICD-10-CM | POA: Diagnosis not present

## 2019-06-22 ENCOUNTER — Other Ambulatory Visit: Payer: Self-pay | Admitting: Internal Medicine

## 2019-06-22 DIAGNOSIS — K5732 Diverticulitis of large intestine without perforation or abscess without bleeding: Secondary | ICD-10-CM

## 2019-06-23 ENCOUNTER — Encounter: Payer: Self-pay | Admitting: Internal Medicine

## 2019-06-23 ENCOUNTER — Other Ambulatory Visit: Payer: Self-pay

## 2019-06-23 ENCOUNTER — Ambulatory Visit (INDEPENDENT_AMBULATORY_CARE_PROVIDER_SITE_OTHER): Payer: Medicare HMO | Admitting: Internal Medicine

## 2019-06-23 VITALS — BP 118/82 | HR 90 | Temp 97.1°F | Ht 64.0 in | Wt 159.0 lb

## 2019-06-23 DIAGNOSIS — J019 Acute sinusitis, unspecified: Secondary | ICD-10-CM

## 2019-06-23 DIAGNOSIS — I471 Supraventricular tachycardia: Secondary | ICD-10-CM | POA: Diagnosis not present

## 2019-06-23 DIAGNOSIS — Z0000 Encounter for general adult medical examination without abnormal findings: Secondary | ICD-10-CM

## 2019-06-23 DIAGNOSIS — E7800 Pure hypercholesterolemia, unspecified: Secondary | ICD-10-CM | POA: Diagnosis not present

## 2019-06-23 DIAGNOSIS — G8929 Other chronic pain: Secondary | ICD-10-CM | POA: Diagnosis not present

## 2019-06-23 DIAGNOSIS — R928 Other abnormal and inconclusive findings on diagnostic imaging of breast: Secondary | ICD-10-CM | POA: Diagnosis not present

## 2019-06-23 DIAGNOSIS — M25511 Pain in right shoulder: Secondary | ICD-10-CM

## 2019-06-23 DIAGNOSIS — Z13820 Encounter for screening for osteoporosis: Secondary | ICD-10-CM

## 2019-06-23 DIAGNOSIS — G43809 Other migraine, not intractable, without status migrainosus: Secondary | ICD-10-CM | POA: Diagnosis not present

## 2019-06-23 LAB — POCT URINALYSIS DIPSTICK
Bilirubin, UA: NEGATIVE
Blood, UA: NEGATIVE
Glucose, UA: NEGATIVE
Ketones, UA: NEGATIVE
Leukocytes, UA: NEGATIVE
Nitrite, UA: NEGATIVE
Protein, UA: NEGATIVE
Spec Grav, UA: 1.01 (ref 1.010–1.025)
Urobilinogen, UA: 0.2 E.U./dL
pH, UA: 6 (ref 5.0–8.0)

## 2019-06-23 MED ORDER — DOXYCYCLINE HYCLATE 100 MG PO TABS: 100.0000 mg | ORAL_TABLET | Freq: Two times a day (BID) | ORAL | 0 refills | Status: DC

## 2019-06-23 NOTE — Progress Notes (Signed)
Date:  06/23/2019   Name:  Natalie [name]   DOB:  [birth year]   MRN:  [id number]   Chief Complaint: Annual Exam (Breast Exam. No pap- aged out.) and Shoulder Pain (Rt shoulder pain. Worse at night. Aches on and off. Sleeps on her right side and makes more issues with sleep because its her laying side. Arm feels weaker than left side. Started in December. ) Natalie [name] is a 64 y.o. female who presents today for her Complete Annual Exam. She feels fairly well. She reports exercising doing house and yard projects and trying to walk. She reports she is sleeping poorly which is helped by melatonin.   Mammogram 03/2019 - biopsy done; negative; repeat right with [place] 6 mo Pap  05/2018 negative thin prep DEXA - none Colonoscopy  03/2016 Immunization History  Administered Date(s) Administered  . Influenza,inj,Quad PF,6+ Mos 02/05/2018  . Influenza-Unspecified 01/18/2015, 01/02/2016, 02/11/2017  . Tdap 06/27/2009    Shoulder Pain  The pain is present in the right shoulder. This is a new problem. The current episode started more than 1 month ago. There has been no history of extremity trauma. The quality of the pain is described as burning and sharp. The pain is moderate. Associated symptoms include a limited range of motion and stiffness. Pertinent negatives include no fever. The symptoms are aggravated by activity. She has tried cold (voltaren topical) for the symptoms. The treatment provided mild relief.  Dizziness This is a recurrent problem. The problem occurs daily. Associated symptoms include congestion. Pertinent negatives include no abdominal pain, arthralgias (right shoulder pain), chest pain, chills, coughing, fatigue, fever, headaches, joint swelling, rash, visual change or vomiting.  Sinus Problem This is a recurrent problem. There has been no fever. She is experiencing no pain. Associated symptoms include congestion and sinus pressure (and bloody mucus every morning). Pertinent  negatives include no chills, coughing, headaches or shortness of breath. Past treatments include spray decongestants (nasonex but stopped once blood appeared).    Lab Results  Component Value Date   CREATININE 0.82 05/31/2018   BUN 15 05/31/2018   NA 141 05/31/2018   K 4.5 05/31/2018   CL 104 05/31/2018   CO2 23 05/31/2018   Lab Results  Component Value Date   CHOL 210 (H) 05/31/2018   HDL 63 05/31/2018   LDLCALC 125 (H) 05/31/2018   TRIG 108 05/31/2018   CHOLHDL 3.3 05/31/2018   Lab Results  Component Value Date   TSH 1.830 05/31/2018   Lab Results  Component Value Date   HGBA1C 5.5 11/27/2017   Lab Results  Component Value Date   WBC 8.2 03/10/2018   HGB 14.7 03/10/2018   HCT 42.7 03/10/2018   MCV 94.3 03/10/2018   PLT 295 03/10/2018   Lab Results  Component Value Date   ALT 15 05/31/2018   AST 18 05/31/2018   ALKPHOS 86 05/31/2018   BILITOT 0.5 05/31/2018     Review of Systems  Constitutional: Negative for chills, fatigue and fever.  HENT: Positive for congestion, postnasal drip and sinus pressure (and bloody mucus every morning). Negative for hearing loss, tinnitus, trouble swallowing and voice change.   Eyes: Positive for visual disturbance.  Respiratory: Negative for cough, chest tightness, shortness of breath and wheezing.   Cardiovascular: Negative for chest pain, palpitations and leg swelling.  Gastrointestinal: Negative for abdominal pain, constipation, diarrhea and vomiting.  Endocrine: Negative for polydipsia and polyuria.  Genitourinary: Negative for dysuria, frequency, genital sores, vaginal bleeding  and vaginal discharge.  Musculoskeletal: Positive for stiffness. Negative for arthralgias (right shoulder pain), gait problem and joint swelling.  Skin: Negative for color change and rash.  Neurological: Positive for dizziness. Negative for tremors, light-headedness and headaches.  Hematological: Negative for adenopathy. Does not bruise/bleed easily.   Psychiatric/Behavioral: Negative for dysphoric mood and sleep disturbance. The patient is not nervous/anxious.     Patient Active Problem List   Diagnosis Date Noted  . Supraventricular tachycardia ([place]) 03/22/2018  . Vestibular migraine 11/27/2017  . Diverticulitis of large intestine without perforation or abscess without bleeding   . Benign neoplasm of ascending colon   . History of colostomy reversal     Allergies  Allergen Reactions  . Codeine Nausea Only  . Levofloxacin Other (See Comments)    insomnia  . Effexor [Venlafaxine] Nausea Only    And dizziness  . Sulfa Antibiotics Rash  . Topamax [Topiramate] Other (See Comments)    sedation    Past Surgical History:  Procedure Laterality Date  . BREAST BIOPSY Right 03/15/2019   stereo biopsy/ x clip/ path pending  . COLONOSCOPY WITH PROPOFOL N/A 07/27/2015   Procedure: COLONOSCOPY WITH PROPOFOL;  Surgeon: Lucilla Lame, MD;  Location: [facility];  Service: Endoscopy;  Laterality: N/A;  . COLONOSCOPY WITH PROPOFOL N/A 03/14/2016   Procedure: COLONOSCOPY WITH PROPOFOL;  Surgeon: Lucilla Lame, MD;  Location: [facility];  Service: Endoscopy;  Laterality: N/A;  Cannot arrive before 0830 AM  . COLOSTOMY REVERSAL N/A 09/26/2015   Procedure: COLOSTOMY REVERSAL / TAKE DOWN;  Surgeon: Hubbard Robinson, MD;  Location: [facility];  Service: General;  Laterality: N/A;  . LAPAROTOMY N/A 03/01/2015   Procedure: EXPLORATORY LAPAROTOMY, SIGMOID COLECTOMY, COLOSTOMY;  Surgeon: Hubbard Robinson, MD;  Location: [facility];  Service: General;  Laterality: N/A;  . POLYPECTOMY  03/14/2016   Procedure: POLYPECTOMY;  Surgeon: Lucilla Lame, MD;  Location: [facility];  Service: Endoscopy;;    Social History   Tobacco Use  . Smoking status: Never Smoker  . Smokeless tobacco: Never Used  Substance Use Topics  . Alcohol use: No    Alcohol/week: 0.0 standard drinks  . Drug use: No     Medication list has been reviewed and  updated.  Current Meds  Medication Sig  . azelastine (ASTELIN) 0.1 % nasal spray Place 1 spray into both nostrils 2 (two) times daily. Use in each nostril as directed  . cholecalciferol (VITAMIN D) 1000 units tablet Take 1,000 Units by mouth daily.  . cyanocobalamin 1000 MCG tablet Take 1,000 mcg by mouth daily.  . mometasone (NASONEX) 50 MCG/ACT nasal spray SHAKE LIQUID AND USE 2 SPRAYS IN EACH NOSTRIL DAILY  . Multiple Vitamin (MULTIVITAMIN) tablet Take 1 tablet by mouth daily.  . polyethylene glycol (MIRALAX / GLYCOLAX) packet Take 17 g by mouth daily.  . vitamin C (ASCORBIC ACID) 500 MG tablet Take 500 mg by mouth daily.    PHQ 2/9 Scores 06/23/2019 05/31/2018 03/09/2018 12/28/2017  PHQ - 2 Score 0 0 1 0  PHQ- 9 Score 0 - 7 0    BP Readings from Last 3 Encounters:  06/23/19 118/82  05/31/18 124/82  03/10/18 117/74    Physical Exam Vitals and nursing note reviewed.  Constitutional:      General: She is not in acute distress.    Appearance: She is well-developed.  HENT:     Head: Normocephalic and atraumatic.     Right Ear: Tympanic membrane and ear canal normal.  Left Ear: Tympanic membrane and ear canal normal.     Nose:     Right Sinus: No maxillary sinus tenderness or frontal sinus tenderness.     Left Sinus: No maxillary sinus tenderness or frontal sinus tenderness.  Eyes:     General: No scleral icterus.       Right eye: No discharge.        Left eye: No discharge.     Conjunctiva/sclera: Conjunctivae normal.  Neck:     Thyroid: No thyromegaly.     Vascular: No carotid bruit.  Cardiovascular:     Rate and Rhythm: Normal rate and regular rhythm.     Pulses: Normal pulses.     Heart sounds: Normal heart sounds.  Pulmonary:     Effort: Pulmonary effort is normal. No respiratory distress.     Breath sounds: No wheezing.  Chest:     Breasts:        Right: Tenderness present. No mass, nipple discharge or skin change.        Left: No mass, nipple discharge, skin  change or tenderness.    Abdominal:     General: Bowel sounds are normal.     Palpations: Abdomen is soft.     Tenderness: There is no abdominal tenderness.  Musculoskeletal:     Right shoulder: Tenderness (over bicep tendon proximally) present. No swelling, deformity or effusion. Decreased range of motion.     Left shoulder: Normal.     Cervical back: Normal range of motion. No erythema.     Right lower leg: No edema.     Left lower leg: No edema.  Lymphadenopathy:     Cervical: No cervical adenopathy.     Upper Body:     Right upper body: No supraclavicular adenopathy.     Left upper body: No supraclavicular adenopathy.  Skin:    General: Skin is warm and dry.     Findings: No rash.  Neurological:     Mental Status: She is alert and oriented to person, place, and time.     Cranial Nerves: No cranial nerve deficit.     Sensory: No sensory deficit.     Deep Tendon Reflexes: Reflexes are normal and symmetric.  Psychiatric:        Attention and Perception: Attention normal.        Mood and Affect: Mood normal.        Speech: Speech normal.        Behavior: Behavior normal.     Wt Readings from Last 3 Encounters:  06/23/19 159 lb (72.1 kg)  05/31/18 167 lb (75.8 kg)  03/10/18 155 lb (70.3 kg)    BP 118/82   Pulse 90   Temp (!) 97.1 F (36.2 C) (Temporal)   Ht 5\' 4"  (1.626 m)   Wt 159 lb (72.1 kg)   SpO2 98%   BMI 27.29 kg/m   Assessment and Plan: 1. Annual physical exam Normal exam Continue healthy diet, exercise - CBC with Differential/Platelet - Lipid panel - POCT urinalysis dipstick  2. Vestibular migraine Continues to have dizziness daily - full evaluation with ENT and Ophthalmology with vestibular therapy of no benefit  3. Abnormal mammogram of right breast Due for Dx mammo and [place] in June - MM DIAG BREAST TOMO UNI RIGHT; Future - US BREAST LTD UNI RIGHT INC AXILLA; Future  4. Chronic right shoulder pain Suspect tendonitis - Ambulatory referral to  Orthopedic Surgery  5. Acute sinusitis, recurrence not specified, unspecified  location Will treat for infection but if no improvement or recurrence will refer to ENT D/c nasal spray due to bleeding - doxycycline (VIBRA-TABS) 100 MG tablet; Take 1 tablet (100 mg total) by mouth 2 (two) times daily for 10 days.  Dispense: 20 tablet; Refill: 0  6. Supraventricular tachycardia ([facility]) Resolved after stopping all caffeine - Comprehensive metabolic panel - TSH + free T4  7. Encounter for screening for osteoporosis - DG Bone Density; Future   Partially dictated using Editor, commissioning. Any errors are unintentional.  Halina Maidens, MD [facility]  06/23/2019

## 2019-06-24 ENCOUNTER — Other Ambulatory Visit: Payer: Self-pay | Admitting: Internal Medicine

## 2019-06-24 ENCOUNTER — Telehealth: Payer: Self-pay

## 2019-06-24 DIAGNOSIS — J019 Acute sinusitis, unspecified: Secondary | ICD-10-CM

## 2019-06-24 LAB — COMPREHENSIVE METABOLIC PANEL
ALT: 14 IU/L (ref 0–32)
AST: 21 IU/L (ref 0–40)
Albumin/Globulin Ratio: 1.7 (ref 1.2–2.2)
Albumin: 4.5 g/dL (ref 3.8–4.8)
Alkaline Phosphatase: 95 IU/L (ref 39–117)
BUN/Creatinine Ratio: 20 (ref 12–28)
BUN: 15 mg/dL (ref 8–27)
Bilirubin Total: 0.5 mg/dL (ref 0.0–1.2)
CO2: 24 mmol/L (ref 20–29)
Calcium: 10 mg/dL (ref 8.7–10.3)
Chloride: 103 mmol/L (ref 96–106)
Creatinine, Ser: 0.76 mg/dL (ref 0.57–1.00)
GFR calc Af Amer: 95 mL/min/{1.73_m2} (ref >59)
GFR calc non Af Amer: 83 mL/min/{1.73_m2} (ref >59)
Globulin, Total: 2.7 g/dL (ref 1.5–4.5)
Glucose: 88 mg/dL (ref 65–99)
Potassium: 4.8 mmol/L (ref 3.5–5.2)
Sodium: 142 mmol/L (ref 134–144)
Total Protein: 7.2 g/dL (ref 6.0–8.5)

## 2019-06-24 LAB — LIPID PANEL
Chol/HDL Ratio: 2.6 ratio (ref 0.0–4.4)
Cholesterol, Total: 195 mg/dL (ref 100–199)
HDL: 74 mg/dL (ref >39)
LDL Chol Calc (NIH): 105 mg/dL — ABNORMAL HIGH (ref 0–99)
Triglycerides: 88 mg/dL (ref 0–149)
VLDL Cholesterol Cal: 16 mg/dL (ref 5–40)

## 2019-06-24 LAB — CBC WITH DIFFERENTIAL/PLATELET
Basophils Absolute: 0.1 10*3/uL (ref 0.0–0.2)
Basos: 1 %
EOS (ABSOLUTE): 0.3 10*3/uL (ref 0.0–0.4)
Eos: 5 %
Hematocrit: 39.9 % (ref 34.0–46.6)
Hemoglobin: 14 g/dL (ref 11.1–15.9)
Immature Grans (Abs): 0 10*3/uL (ref 0.0–0.1)
Immature Granulocytes: 0 %
Lymphocytes Absolute: 1.6 10*3/uL (ref 0.7–3.1)
Lymphs: 29 %
MCH: 32.9 pg (ref 26.6–33.0)
MCHC: 35.1 g/dL (ref 31.5–35.7)
MCV: 94 fL (ref 79–97)
Monocytes Absolute: 0.6 10*3/uL (ref 0.1–0.9)
Monocytes: 10 %
Neutrophils Absolute: 3 10*3/uL (ref 1.4–7.0)
Neutrophils: 55 %
Platelets: 280 10*3/uL (ref 150–450)
RBC: 4.25 x10E6/uL (ref 3.77–5.28)
RDW: 11.9 % (ref 11.7–15.4)
WBC: 5.6 10*3/uL (ref 3.4–10.8)

## 2019-06-24 LAB — TSH+FREE T4
Free T4: 1.23 ng/dL (ref 0.82–1.77)
TSH: 2.25 u[IU]/mL (ref 0.450–4.500)

## 2019-06-24 MED ORDER — AMOXICILLIN 875 MG PO TABS: 875.0000 mg | ORAL_TABLET | Freq: Two times a day (BID) | ORAL | 0 refills | Status: AC

## 2019-06-24 NOTE — Telephone Encounter (Signed)
Yes. Patient informed of new medication sent. Will try amoxicillin.

## 2019-06-24 NOTE — Telephone Encounter (Signed)
Patient called saying she has taken 3 pills of her doxycycline but this is upsetting her stomaching and causing her vomit.  Wants something different sent in. Said Augmentin also upsets her stomach but she is will to try amoxicillin if this is what you want.  Please advise.  -[facility] [address].

## 2019-06-24 NOTE — Telephone Encounter (Signed)
Is she taking the Doxycycline with food?  I sent in Amoxicillin.

## 2019-07-01 DIAGNOSIS — M7551 Bursitis of right shoulder: Secondary | ICD-10-CM | POA: Diagnosis not present

## 2019-07-01 DIAGNOSIS — M7541 Impingement syndrome of right shoulder: Secondary | ICD-10-CM | POA: Diagnosis not present

## 2019-07-01 DIAGNOSIS — G8929 Other chronic pain: Secondary | ICD-10-CM | POA: Diagnosis not present

## 2019-07-01 DIAGNOSIS — M25511 Pain in right shoulder: Secondary | ICD-10-CM | POA: Diagnosis not present

## 2019-07-18 DIAGNOSIS — E7800 Pure hypercholesterolemia, unspecified: Secondary | ICD-10-CM | POA: Insufficient documentation

## 2019-09-14 ENCOUNTER — Other Ambulatory Visit: Payer: 59

## 2019-09-21 DIAGNOSIS — R69 Illness, unspecified: Secondary | ICD-10-CM | POA: Diagnosis not present

## 2019-09-22 ENCOUNTER — Encounter: Payer: Self-pay | Admitting: Internal Medicine

## 2019-09-22 ENCOUNTER — Ambulatory Visit
Admission: RE | Admit: 2019-09-22 | Discharge: 2019-09-22 | Disposition: A | Discharge status: Home / Self Care | Payer: Medicare HMO | Source: Ambulatory Visit | Attending: Internal Medicine | Admitting: Internal Medicine

## 2019-09-22 DIAGNOSIS — Z13820 Encounter for screening for osteoporosis: Secondary | ICD-10-CM | POA: Diagnosis not present

## 2019-09-22 DIAGNOSIS — M8580 Other specified disorders of bone density and structure, unspecified site: Secondary | ICD-10-CM | POA: Insufficient documentation

## 2019-09-22 DIAGNOSIS — R922 Inconclusive mammogram: Secondary | ICD-10-CM | POA: Diagnosis not present

## 2019-09-22 DIAGNOSIS — R928 Other abnormal and inconclusive findings on diagnostic imaging of breast: Secondary | ICD-10-CM | POA: Diagnosis not present

## 2019-09-22 DIAGNOSIS — M8589 Other specified disorders of bone density and structure, multiple sites: Secondary | ICD-10-CM | POA: Insufficient documentation

## 2019-09-22 DIAGNOSIS — Z780 Asymptomatic menopausal state: Secondary | ICD-10-CM | POA: Diagnosis not present

## 2019-09-23 ENCOUNTER — Telehealth: Payer: Self-pay | Admitting: Internal Medicine

## 2019-09-23 NOTE — Telephone Encounter (Signed)
Tried calling pt about bone density. Routed note to [facility] Nurse Triage to give pt results if she returns call.  CM

## 2019-09-23 NOTE — Telephone Encounter (Signed)
Copied from [place] [id number]. Topic: General - Call Back - No Documentation >> Sep 23, 2019 11:03 AM Erick Blinks wrote: Reason for CRM: Pt called in regard to bone density test results. Please advise  Best contact: [phone]

## 2020-02-11 DIAGNOSIS — Z20822 Contact with and (suspected) exposure to covid-19: Secondary | ICD-10-CM | POA: Diagnosis not present

## 2020-02-14 ENCOUNTER — Ambulatory Visit (INDEPENDENT_AMBULATORY_CARE_PROVIDER_SITE_OTHER): Payer: Medicare HMO | Admitting: Internal Medicine

## 2020-02-14 ENCOUNTER — Other Ambulatory Visit: Payer: Self-pay

## 2020-02-14 ENCOUNTER — Encounter: Payer: Self-pay | Admitting: Internal Medicine

## 2020-02-14 VITALS — BP 112/74 | HR 75 | Temp 97.2°F | Ht 64.0 in | Wt 160.0 lb

## 2020-02-14 DIAGNOSIS — J0100 Acute maxillary sinusitis, unspecified: Secondary | ICD-10-CM

## 2020-02-14 DIAGNOSIS — R0981 Nasal congestion: Secondary | ICD-10-CM

## 2020-02-14 MED ORDER — MOMETASONE FUROATE 50 MCG/ACT NA SUSP: 2.0000 | Freq: Every day | NASAL | 5 refills | Status: DC

## 2020-02-14 MED ORDER — AMOXICILLIN-POT CLAVULANATE 875-125 MG PO TABS: 1.0000 | ORAL_TABLET | Freq: Two times a day (BID) | ORAL | 0 refills | Status: AC

## 2020-02-14 NOTE — Progress Notes (Signed)
Date:  02/14/2020   Name:  Natalie [name]   DOB:  April 07, 1956   MRN:  [id number]   Chief Complaint: Sinusitis (X almost 2 weeks, pressure behind eyes,headache, congestion, yellow mucous when blowing nose and blood as well, felt like she had a fever )  Sinusitis This is a new problem. The current episode started 1 to 4 weeks ago. The problem has been gradually worsening since onset. There has been no fever (may be low grade). The pain is mild. Associated symptoms include congestion, headaches and sinus pressure. Pertinent negatives include no chills, coughing, ear pain or shortness of breath. Past treatments include spray decongestants. The treatment provided no relief.  Tested for Covid at [facility] two days ago and was negative. She is not vaccinated for Covid-19.  Lab Results  Component Value Date   CREATININE 0.76 06/23/2019   BUN 15 06/23/2019   NA 142 06/23/2019   K 4.8 06/23/2019   CL 103 06/23/2019   CO2 24 06/23/2019   Lab Results  Component Value Date   CHOL 195 06/23/2019   HDL 74 06/23/2019   LDLCALC 105 (H) 06/23/2019   TRIG 88 06/23/2019   CHOLHDL 2.6 06/23/2019   Lab Results  Component Value Date   TSH 2.250 06/23/2019   Lab Results  Component Value Date   HGBA1C 5.5 11/27/2017   Lab Results  Component Value Date   WBC 5.6 06/23/2019   HGB 14.0 06/23/2019   HCT 39.9 06/23/2019   MCV 94 06/23/2019   PLT 280 06/23/2019   Lab Results  Component Value Date   ALT 14 06/23/2019   AST 21 06/23/2019   ALKPHOS 95 06/23/2019   BILITOT 0.5 06/23/2019     Review of Systems  Constitutional: Negative for chills, fatigue and fever.  HENT: Positive for congestion, postnasal drip and sinus pressure. Negative for ear pain and trouble swallowing.   Respiratory: Negative for cough, chest tightness, shortness of breath and wheezing.   Cardiovascular: Negative for chest pain and palpitations.  Neurological: Positive for dizziness and headaches. Negative for  tremors and syncope.    Patient Active Problem List   Diagnosis Date Noted  . Osteopenia determined by x-ray 09/22/2019  . Elevated LDL cholesterol level 07/18/2019  . Supraventricular tachycardia ([place]) 03/22/2018  . Vestibular migraine 11/27/2017  . Diverticulitis of large intestine without perforation or abscess without bleeding   . Benign neoplasm of ascending colon   . History of colostomy reversal     Allergies  Allergen Reactions  . Codeine Nausea Only  . Doxycycline Nausea And Vomiting  . Levofloxacin Other (See Comments)    insomnia  . Effexor [Venlafaxine] Nausea Only    And dizziness  . Sulfa Antibiotics Rash  . Topamax [Topiramate] Other (See Comments)    sedation    Past Surgical History:  Procedure Laterality Date  . BREAST BIOPSY Right 03/15/2019   stereo biopsy/ x clip/ benign  . COLONOSCOPY WITH PROPOFOL N/A 07/27/2015   Procedure: COLONOSCOPY WITH PROPOFOL;  Surgeon: Lucilla Lame, MD;  Location: [facility];  Service: Endoscopy;  Laterality: N/A;  . COLONOSCOPY WITH PROPOFOL N/A 03/14/2016   Procedure: COLONOSCOPY WITH PROPOFOL;  Surgeon: Lucilla Lame, MD;  Location: [place];  Service: Endoscopy;  Laterality: N/A;  Cannot arrive before 0830 AM  . COLOSTOMY REVERSAL N/A 09/26/2015   Procedure: COLOSTOMY REVERSAL / TAKE DOWN;  Surgeon: Hubbard Robinson, MD;  Location: [facility];  Service: General;  Laterality: N/A;  .  LAPAROTOMY N/A 03/01/2015   Procedure: EXPLORATORY LAPAROTOMY, SIGMOID COLECTOMY, COLOSTOMY;  Surgeon: Hubbard Robinson, MD;  Location: [facility];  Service: General;  Laterality: N/A;  . POLYPECTOMY  03/14/2016   Procedure: POLYPECTOMY;  Surgeon: Lucilla Lame, MD;  Location: [place];  Service: Endoscopy;;    Social History   Tobacco Use  . Smoking status: Never Smoker  . Smokeless tobacco: Never Used  Vaping Use  . Vaping Use: Never used  Substance Use Topics  . Alcohol use: No    Alcohol/week: 0.0 standard  drinks  . Drug use: No     Medication list has been reviewed and updated.  Current Meds  Medication Sig  . cholecalciferol (VITAMIN D) 1000 units tablet Take 1,000 Units by mouth daily.  . cyanocobalamin 1000 MCG tablet Take 1,000 mcg by mouth daily.  . mometasone (NASONEX) 50 MCG/ACT nasal spray SHAKE LIQUID AND USE 2 SPRAYS IN EACH NOSTRIL DAILY  . Multiple Vitamin (MULTIVITAMIN) tablet Take 1 tablet by mouth daily.  . polyethylene glycol (MIRALAX / GLYCOLAX) packet Take 17 g by mouth daily.  . vitamin C (ASCORBIC ACID) 500 MG tablet Take 500 mg by mouth daily.  . [DISCONTINUED] azelastine (ASTELIN) 0.1 % nasal spray Place 1 spray into both nostrils 2 (two) times daily. Use in each nostril as directed    PHQ 2/9 Scores 02/14/2020 06/23/2019 05/31/2018 03/09/2018  PHQ - 2 Score 0 0 0 1  PHQ- 9 Score 0 0 - 7    GAD 7 : Generalized Anxiety Score 02/14/2020  Nervous, Anxious, on Edge 0  Control/stop worrying 0  Worry too much - different things 0  Trouble relaxing 0  Restless 0  Easily annoyed or irritable 0  Afraid - awful might happen 0  Total GAD 7 Score 0    BP Readings from Last 3 Encounters:  02/14/20 112/74  06/23/19 118/82  05/31/18 124/82    Physical Exam Constitutional:      Appearance: She is well-developed.  HENT:     Right Ear: Ear canal and external ear normal. Tympanic membrane is not erythematous or retracted.     Left Ear: Ear canal and external ear normal. Tympanic membrane is not erythematous or retracted.     Nose:     Right Sinus: Maxillary sinus tenderness and frontal sinus tenderness present.     Left Sinus: Maxillary sinus tenderness and frontal sinus tenderness present.     Mouth/Throat:     Mouth: No oral lesions.     Pharynx: Uvula midline. Posterior oropharyngeal erythema present. No oropharyngeal exudate.  Cardiovascular:     Rate and Rhythm: Normal rate and regular rhythm.     Heart sounds: Normal heart sounds.  Pulmonary:     Breath  sounds: Normal breath sounds. No wheezing or rales.  Lymphadenopathy:     Cervical: No cervical adenopathy.  Neurological:     Mental Status: She is alert and oriented to person, place, and time.     Wt Readings from Last 3 Encounters:  02/14/20 160 lb (72.6 kg)  06/23/19 159 lb (72.1 kg)  05/31/18 167 lb (75.8 kg)    BP 112/74   Pulse 75   Temp (!) 97.2 F (36.2 C) (Oral)   Ht 5\' 4"  (1.626 m)   Wt 160 lb (72.6 kg)   SpO2 97%   BMI 27.46 kg/m   Assessment and Plan: 1. Acute non-recurrent maxillary sinusitis Continue Nasonex spray, fluids Return if no improvement - amoxicillin-clavulanate (AUGMENTIN) 875-125  MG tablet; Take 1 tablet by mouth 2 (two) times daily for 10 days.  Dispense: 20 tablet; Refill: 0  2. Nasal sinus congestion - mometasone (NASONEX) 50 MCG/ACT nasal spray; Place 2 sprays into the nose daily.  Dispense: 17 g; Refill: 5   Partially dictated using Editor, commissioning. Any errors are unintentional.  Halina Maidens, MD [facility]  02/14/2020

## 2020-03-14 ENCOUNTER — Encounter: Payer: Self-pay | Admitting: Internal Medicine

## 2020-03-14 ENCOUNTER — Other Ambulatory Visit: Payer: Self-pay

## 2020-03-14 ENCOUNTER — Ambulatory Visit (INDEPENDENT_AMBULATORY_CARE_PROVIDER_SITE_OTHER): Payer: Medicare HMO | Admitting: Internal Medicine

## 2020-03-14 VITALS — BP 124/64 | HR 96 | Ht 64.0 in | Wt 161.0 lb

## 2020-03-14 DIAGNOSIS — G4730 Sleep apnea, unspecified: Secondary | ICD-10-CM | POA: Diagnosis not present

## 2020-03-14 DIAGNOSIS — G4761 Periodic limb movement disorder: Secondary | ICD-10-CM | POA: Insufficient documentation

## 2020-03-14 DIAGNOSIS — G2581 Restless legs syndrome: Secondary | ICD-10-CM | POA: Insufficient documentation

## 2020-03-14 NOTE — Progress Notes (Signed)
Date:  03/14/2020   Name:  Natalie [name]   DOB:  01/06/1956   MRN:  [id number]   Chief Complaint: Snoring (Husband says she snores even on her side and gasping for air at night. Never feels rested in the mornings. ) Snoring - noticed by her husband despite changing positions.  He also notices that her breathing gets shallower to the point that she then gasps for breath.  She wakens more often during the night.  She is tired in the morning and does not feel that she sleeps well.  She also has noticed some morning headaches.  HPI  Results of the [place] flowsheet 03/14/2020  Sitting and reading 1  Watching TV 2  Sitting, inactive in a public place (e.g. a theatre or a meeting) 0  As a passenger in a car for an hour without a break 1  Lying down to rest in the afternoon when circumstances permit 3  Sitting and talking to someone 0  Sitting quietly after a lunch without alcohol 1  In a car, while stopped for a few minutes in traffic 0  Total score 8    Lab Results  Component Value Date   CREATININE 0.76 06/23/2019   BUN 15 06/23/2019   NA 142 06/23/2019   K 4.8 06/23/2019   CL 103 06/23/2019   CO2 24 06/23/2019   Lab Results  Component Value Date   CHOL 195 06/23/2019   HDL 74 06/23/2019   LDLCALC 105 (H) 06/23/2019   TRIG 88 06/23/2019   CHOLHDL 2.6 06/23/2019   Lab Results  Component Value Date   TSH 2.250 06/23/2019   Lab Results  Component Value Date   HGBA1C 5.5 11/27/2017   Lab Results  Component Value Date   WBC 5.6 06/23/2019   HGB 14.0 06/23/2019   HCT 39.9 06/23/2019   MCV 94 06/23/2019   PLT 280 06/23/2019   Lab Results  Component Value Date   ALT 14 06/23/2019   AST 21 06/23/2019   ALKPHOS 95 06/23/2019   BILITOT 0.5 06/23/2019     Review of Systems  Constitutional: Positive for fatigue. Negative for chills and unexpected weight change.  HENT: Negative for congestion and trouble swallowing.   Respiratory: Negative for cough, chest  tightness and shortness of breath.   Cardiovascular: Negative for chest pain.  Neurological: Positive for headaches. Negative for dizziness and light-headedness.  Psychiatric/Behavioral: Positive for sleep disturbance. Negative for dysphoric mood. The patient is not nervous/anxious.     Patient Active Problem List   Diagnosis Date Noted  . Osteopenia determined by x-ray 09/22/2019  . Elevated LDL cholesterol level 07/18/2019  . Supraventricular tachycardia ([place]) 03/22/2018  . Vestibular migraine 11/27/2017  . Diverticulitis of large intestine without perforation or abscess without bleeding   . Benign neoplasm of ascending colon   . History of colostomy reversal     Allergies  Allergen Reactions  . Codeine Nausea Only  . Doxycycline Nausea And Vomiting  . Levofloxacin Other (See Comments)    insomnia  . Effexor [Venlafaxine] Nausea Only    And dizziness  . Sulfa Antibiotics Rash  . Topamax [Topiramate] Other (See Comments)    sedation    Past Surgical History:  Procedure Laterality Date  . BREAST BIOPSY Right 03/15/2019   stereo biopsy/ x clip/ benign  . COLONOSCOPY WITH PROPOFOL N/A 07/27/2015   Procedure: COLONOSCOPY WITH PROPOFOL;  Surgeon: Lucilla Lame, MD;  Location: [facility];  Service: Endoscopy;  Laterality: N/A;  . COLONOSCOPY WITH PROPOFOL N/A 03/14/2016   Procedure: COLONOSCOPY WITH PROPOFOL;  Surgeon: Lucilla Lame, MD;  Location: [place] [facility];  Service: Endoscopy;  Laterality: N/A;  Cannot arrive before 0830 AM  . COLOSTOMY REVERSAL N/A 09/26/2015   Procedure: COLOSTOMY REVERSAL / TAKE DOWN;  Surgeon: Hubbard Robinson, MD;  Location: [facility];  Service: General;  Laterality: N/A;  . LAPAROTOMY N/A 03/01/2015   Procedure: EXPLORATORY LAPAROTOMY, SIGMOID COLECTOMY, COLOSTOMY;  Surgeon: Hubbard Robinson, MD;  Location: [facility];  Service: General;  Laterality: N/A;  . POLYPECTOMY  03/14/2016   Procedure: POLYPECTOMY;  Surgeon: Lucilla Lame, MD;   Location: [place];  Service: Endoscopy;;    Social History   Tobacco Use  . Smoking status: Never Smoker  . Smokeless tobacco: Never Used  Vaping Use  . Vaping Use: Never used  Substance Use Topics  . Alcohol use: No    Alcohol/week: 0.0 standard drinks  . Drug use: No     Medication list has been reviewed and updated.  Current Meds  Medication Sig  . cholecalciferol (VITAMIN D) 1000 units tablet Take 1,000 Units by mouth daily.  . cyanocobalamin 1000 MCG tablet Take 1,000 mcg by mouth daily.  . mometasone (NASONEX) 50 MCG/ACT nasal spray Place 2 sprays into the nose daily.  . Multiple Vitamin (MULTIVITAMIN) tablet Take 1 tablet by mouth daily.  . polyethylene glycol (MIRALAX / GLYCOLAX) packet Take 17 g by mouth daily.  . vitamin C (ASCORBIC ACID) 500 MG tablet Take 500 mg by mouth daily.    PHQ 2/9 Scores 03/14/2020 02/14/2020 06/23/2019 05/31/2018  PHQ - 2 Score 0 0 0 0  PHQ- 9 Score 0 0 0 -    GAD 7 : Generalized Anxiety Score 03/14/2020 02/14/2020  Nervous, Anxious, on Edge 0 0  Control/stop worrying 0 0  Worry too much - different things 0 0  Trouble relaxing 0 0  Restless 0 0  Easily annoyed or irritable 0 0  Afraid - awful might happen 0 0  Total GAD 7 Score 0 0  Anxiety Difficulty Not difficult at all -    BP Readings from Last 3 Encounters:  03/14/20 124/64  02/14/20 112/74  06/23/19 118/82    Physical Exam Vitals and nursing note reviewed.  Constitutional:      General: She is not in acute distress.    Appearance: Normal appearance. She is well-developed.  HENT:     Head: Normocephalic and atraumatic.  Neck:     Vascular: No carotid bruit.  Cardiovascular:     Rate and Rhythm: Normal rate and regular rhythm.  Pulmonary:     Effort: Pulmonary effort is normal. No respiratory distress.     Breath sounds: No wheezing or rhonchi.  Musculoskeletal:     Cervical back: Normal range of motion.  Lymphadenopathy:     Cervical: No cervical  adenopathy.  Skin:    General: Skin is warm and dry.     Findings: No rash.  Neurological:     General: No focal deficit present.     Mental Status: She is alert and oriented to person, place, and time.  Psychiatric:        Mood and Affect: Mood normal.     Wt Readings from Last 3 Encounters:  03/14/20 161 lb (73 kg)  02/14/20 160 lb (72.6 kg)  06/23/19 159 lb (72.1 kg)    BP 124/64   Pulse 96   Ht 5\' 4"  (  1.626 m)   Wt 161 lb (73 kg)   SpO2 95%   BMI 27.64 kg/m   Assessment and Plan: 1. Sleep-disordered breathing Her symptoms are suggestive of OSA Will get studies and advise - Ambulatory referral to Sleep Studies   Partially dictated using Dragon software. Any errors are unintentional.  Halina Maidens, MD [facility]  03/14/2020

## 2020-03-15 ENCOUNTER — Telehealth: Payer: Self-pay

## 2020-03-15 NOTE — Telephone Encounter (Signed)
Copied from [place] [id number]. Topic: General - Other >> Mar 15, 2020  1:31 PM Celene Kras wrote: Reason for CRM: Pt calling stating that she contacted insurance today. She states that they do not cover the [employer] for her sleep study. She is requesting to have a new referral placed with someone in network with [employer]. Please advise.

## 2020-03-20 ENCOUNTER — Telehealth: Payer: Self-pay

## 2020-03-20 NOTE — Telephone Encounter (Signed)
Please Review.  KP

## 2020-03-20 NOTE — Telephone Encounter (Signed)
Contacted Parke Poisson. She is working on the [employer].  KP

## 2020-03-20 NOTE — Telephone Encounter (Unsigned)
Copied from [place] [id number]. Topic: Referral - Request for Referral >> Mar 20, 2020 10:44 AM Hinda Lenis D wrote: Insurance did not cover last referral  Has patient seen PCP for this complaint? Yes *If NO, is insurance requiring patient see PCP for this issue before PCP can refer them? Referral for which specialty: sleep study  Preferred provider/office: [facility]2 5 [facility] Sleep clinic in [place], [place] Address: [address] [place], [place], [place] [address] Hours:  Open  Closes 5PM Phone: [phone] Reason for referral: Sleep study

## 2020-03-20 NOTE — Telephone Encounter (Signed)
Let the referral center know this so they can re-send

## 2020-04-25 ENCOUNTER — Encounter (INDEPENDENT_AMBULATORY_CARE_PROVIDER_SITE_OTHER): Payer: Medicare HMO | Admitting: Internal Medicine

## 2020-04-25 DIAGNOSIS — G4719 Other hypersomnia: Secondary | ICD-10-CM

## 2020-04-25 DIAGNOSIS — G4710 Hypersomnia, unspecified: Secondary | ICD-10-CM

## 2020-04-26 NOTE — Procedures (Signed)
[facility] Report Part I                                                               Phone: [phone] Fax: [phone]  Patient Name: Natalie [name], Natalie [name] Acquisition Number: [id number]  Date of Birth: 1955-12-18 Acquisition Date: 04/25/2020  Referring Physician: Halina Maidens, MD     History: The patient is a 65 year old female who was referred for evaluation of possible sleep apnea. Medical History: osteopenia, cholesterol, supraventricular tachycardia, migraines.          Medications: cholecalciferol, cyanocobalamin, mometasone, multiple vitamin, polyethylene glycol.  Procedure: This routine overnight polysomnogram was performed on the [facility] using the standard diagnostic protocol. This included 6 channels of EEG, 2 channels of EOG, chin EMG, bilateral anterior tibialis EMG, nasal/oral thermistor, PTAF (nasal pressure transducer), chest and abdominal wall movements, EKG, and pulse oximetry.  Description: The total recording time was 406.6 minutes. The total sleep time was 204.0 minutes. There were a total of 110.3 minutes of wakefulness after sleep onset for a poor?sleep efficiency of 50.2%. The latency to sleep onset was prolonged at 92.3 minutes. The R sleep onset latency was within normal limits at 88.5 minutes.?? Sleep parameters, as a percentage of the total sleep time, demonstrated 4.7% of sleep was in N1 sleep, 66.4% N2, 16.7% N3 and 12.3% R sleep. There were a total of 8 arousals for an arousal index of 2.4 arousals per hour of sleep that was normal.???  Respiratory monitoring demonstrated rare mild degree of snoring in all positions. Only 2 hypopneas were observed the entire study. The baseline oxygen saturation during wakefulness was 94%, during NREM sleep averaged 95%, and during REM sleep averaged  95%. The total duration of oxygen < 90% was 0.4 minutes.  Cardiac monitoring- There were no significant cardiac rhythm irregularities.    Periodic limb movement monitoring- demonstrated that there were 90 periodic limb movements for a periodic limb movement index of 26.5 periodic limb movements per hour of sleep. Very frequent quasi-periodic limb movements were observed during periods of wakefulness.  Impression: ?This routine overnight polysomnogram did not demonstrate significant obstructive sleep apnea with only 2 hypopneas observed.    There was a significantly elevated periodic limb movement index of 26.5 periodic limb movements per hour of sleep. In addition, very frequent quasi-periodic limb movements were observed during periods of wakefulness. The patient reports difficulty initiating sleep along with symptoms suggestive of restless leg syndrome. Treatment may improve sleep quality if clinically appropriate.  There was a poor sleep efficiency with a reduced percentage of REM sleep.? These findings would appear to be due to the periodic limb movements.??????????????????  Recommendations:     1. This sleep did not demonstrate significant sleep apnea with an overall AHI of 0.6 per hour. 2. Would recommend weight loss in a patient with a BMI of 29.0.     Allyne Gee, MD, [facility] Diplomate ABMS-Pulmonary, Critical Care and Sleep Medicine  Electronically reviewed and digitally signed       [facility] Report Part II  Phone: [phone] Fax: [phone]  Patient last name [name] Neck Size 13.5 in. Acquisition [id number]  Patient first name Natalie Weight  169.0 lbs. Started 04/25/2020 at 10:23:46 PM  Birth date 05-21-1955 Height 64.0 in. Stopped 04/26/2020 at 5:16:10 AM  Age 45 BMI 29.0 lb/in2 Duration 406.6  Study Type Adult      Report generated by: Adriana Mccallum, RPSGT Sleep Data: Lights Out: 10:27:28 PM Sleep Onset: 11:59:46 PM  Lights On: 5:14:04 AM Sleep Efficiency: 50.2 %  Total Recording Time: 406.6 min Sleep Latency (from Lights Off) 92.3 min  Total Sleep Time (TST): 204.0  min R Latency (from Sleep Onset): 88.5 min  Sleep Period Time: 301.5 min Total number of awakenings: 9  Wake during sleep: 97.5 min Wake After Sleep Onset (WASO): 110.3 min   Sleep Data:         Arousal Summary: Stage  Latency from lights out (min) Latency from sleep onset (min) Duration (min) % Total Sleep Time  Normal values  N 1 92.3 0.0 9.5 4.7 (5%)  N 2 94.8 2.5 135.5 66.4 (50%)  N 3 142.8 50.5 34.0 16.7 (20%)  R 180.8 88.5 25.0 12.3 (25%)    Number Index  Spontaneous 15 4.4  Apneas & Hypopneas 0 0.0  RERAs 1 0.3       (Apneas & Hypopneas & RERAs)  (1) (0.3)  Limb Movement 2 0.6  Snore 0 0.0  TOTAL 18 5.3      Respiratory Data:  CA OA MA Apnea Hypopnea* A+ H RERA Total  Number 0 0 0 0 2 2 1 3   Mean Dur (sec) 0.0 0.0 0.0 0.0 34.8 34.8 31.5 33.7  Max Dur (sec) 0.0 0.0 0.0 0.0 41.5 41.5 31.5 41.5  Total Dur (min) 0.0 0.0 0.0 0.0 1.2 1.2 0.5 1.7  % of TST 0.0 0.0 0.0 0.0 0.6 0.6 0.3 0.8  Index (#/h TST) 0.0 0.0 0.0 0.0 0.6 0.6 0.3 0.9  *Hypopneas scored based on 4% or greater desaturation.  Sleep Stage:        REM NREM TST  AHI 0.0 0.7 0.6  RDI 0.0 1.0 0.9            Body Position Data:  Sleep (min) TST (%) REM (min) NREM (min) CA (#) OA (#) MA (#) HYP (#) AHI (#/h) RERA (#) RDI (#/h) Desat (#)  Supine 22.0 10.78 0.0 22.0 0 0 0 0 0.0 0 0.00 1  Non-Supine 182.00 89.22 25.00 157.00 0.00 0.00 0.00 2.00 0.66 1.00 0.99 5.00  Left: 80.0 39.22 10.0 70.0 0 0 0 0 0.0 0 0.00 1  Right: 102.0 50.00 15.0 87.0 0 0 0 2 1.2 1 1.8 4     Snoring: Total number of snoring episodes  0  Total time with snoring    min (   % of sleep)   Oximetry Distribution:             WK REM NREM TOTAL  Average (%)   94 95 95 95  < 90% 0.4 0.0 0.0 0.4  < 80% 0.4 0.0 0.0 0.4  < 70% 0.4 0.0 0.0 0.4  # of Desaturations* 2 0 4 6  Desat Index (#/hour) 0.6 0.0 1.3 1.8  Desat Max (%) 5 0 3 5  Desat Max Dur (sec) 15.0 0.0 105.0 105.0  Approx Min O2 during sleep 92  Approx min  O2 during a respiratory event 93  Was Oxygen added (Y/N) and final rate No:   0 LPM  *Desaturations based on 3% or greater drop from baseline.   Cheyne Stokes Breathing: None Present    Heart  Rate Summary:  Average Heart Rate During Sleep 53.8 bpm      Highest Heart Rate During Sleep (95th %) 115.0 bpm (artifact)  Highest Heart Rate During Sleep 115 bpm (artifact)  Highest Heart Rate During Recording (TIB) 115 bpm (artifact)   Heart Rate Observations: Event Type # Events   Bradycardia 0 Lowest HR Scored: N/A  Sinus Tachycardia During Sleep 0 Highest HR Scored: N/A  Narrow Complex Tachycardia 0 Highest HR Scored: N/A  Wide Complex Tachycardia 0 Highest HR Scored: N/A  Asystole 0 Longest Pause: N/A  Atrial Fibrillation 0 Duration Longest Event: N/A  Other Arrythmias  No Type:    Periodic Limb Movement Data: (Primary legs unless otherwise noted) Total # Limb Movement 92 Limb Movement Index 27.1  Total # PLMS 90 PLMS Index 26.5  Total # PLMS Arousals 1 PLMS Arousal Index 0.3  Percentage Sleep Time with PLMS 51.67min (25.1 % sleep)  Mean Duration limb movements (secs) 613.7

## 2020-04-27 ENCOUNTER — Telehealth: Payer: Self-pay

## 2020-04-27 NOTE — Telephone Encounter (Signed)
Noted  KP 

## 2020-04-27 NOTE — Telephone Encounter (Signed)
Copied from [place] [id number]. Topic: General - Other >> Apr 27, 2020 12:08 PM Erick Blinks wrote: Pt called to report the date of her sleep study to her PCP.  [04/25/2020]

## 2020-04-30 NOTE — Progress Notes (Signed)
Call her to make sure that she got the results from this sleep study from the ordering MD.

## 2020-05-01 ENCOUNTER — Telehealth: Payer: Self-pay | Admitting: Internal Medicine

## 2020-05-01 NOTE — Telephone Encounter (Signed)
Left message for patient to call back and schedule Medicare Annual Wellness Visit (AWV) either virtually or in office. Whichever the patients preference is.  No history of AWV; please schedule at anytime with [facility] Advisor.  This should be a 40 minute visit  AWV-I PER [place] AS OF 03/07/2020

## 2020-05-02 ENCOUNTER — Telehealth: Payer: Self-pay

## 2020-05-02 NOTE — Telephone Encounter (Signed)
-----   Message from Glean Hess, MD sent at 04/30/2020 11:12 AM EST -----   ----- Message ----- From: Allyne Gee, MD Sent: 04/26/2020   7:03 PM EST To: Glean Hess, MD

## 2020-05-02 NOTE — Telephone Encounter (Signed)
Called patient and left VM just to make sure patient was aware that her sleep study results came back show that she does NOT have sleep apnea. Told her to call back if she has any further questions, but she just wanted to call her to make sure she is aware.

## 2020-05-16 ENCOUNTER — Ambulatory Visit (INDEPENDENT_AMBULATORY_CARE_PROVIDER_SITE_OTHER): Payer: Medicare HMO

## 2020-05-16 DIAGNOSIS — Z0000 Encounter for general adult medical examination without abnormal findings: Secondary | ICD-10-CM

## 2020-05-16 NOTE — Patient Instructions (Signed)
Natalie [name] , Thank you for taking time to come for your Medicare Wellness Visit. I appreciate your ongoing commitment to your health goals. Please review the following plan we discussed and let me know if I can assist you in the future.   Screening recommendations/referrals: Colonoscopy: done 03/14/16. Repeat in 03/2021. Mammogram: done 09/22/19 Bone Density: done 09/22/19 Recommended yearly ophthalmology/optometry visit for glaucoma screening and checkup Recommended yearly dental visit for hygiene and checkup  Vaccinations: Influenza vaccine: declined Pneumococcal vaccine: declined Tdap vaccine: due Shingles vaccine: Shingrix discussed. Please contact your pharmacy for coverage information.  Covid-19: declined  Advanced directives: Please bring a copy of your health care power of attorney and living will to the office at your convenience.  Conditions/risks identified: Keep up the great work!  Next appointment: Follow up in one year for your annual wellness visit    Preventive Care 65 Years and Older, Female Preventive care refers to lifestyle choices and visits with your health care provider that can promote health and wellness. What does preventive care include?  A yearly physical exam. This is also called an annual well check.  Dental exams once or twice a year.  Routine eye exams. Ask your health care provider how often you should have your eyes checked.  Personal lifestyle choices, including:  Daily care of your teeth and gums.  Regular physical activity.  Eating a healthy diet.  Avoiding tobacco and drug use.  Limiting alcohol use.  Practicing safe sex.  Taking low-dose aspirin every day.  Taking vitamin and mineral supplements as recommended by your health care provider. What happens during an annual well check? The services and screenings done by your health care provider during your annual well check will depend on your age, overall health, lifestyle risk  factors, and family history of disease. Counseling  Your health care provider may ask you questions about your:  Alcohol use.  Tobacco use.  Drug use.  Emotional well-being.  Home and relationship well-being.  Sexual activity.  Eating habits.  History of falls.  Memory and ability to understand (cognition).  Work and work Statistician.  Reproductive health. Screening  You may have the following tests or measurements:  Height, weight, and BMI.  Blood pressure.  Lipid and cholesterol levels. These may be checked every 5 years, or more frequently if you are over 10 years old.  Skin check.  Lung cancer screening. You may have this screening every year starting at age 50 if you have a 30-pack-year history of smoking and currently smoke or have quit within the past 15 years.  Fecal occult blood test (FOBT) of the stool. You may have this test every year starting at age 68.  Flexible sigmoidoscopy or colonoscopy. You may have a sigmoidoscopy every 5 years or a colonoscopy every 10 years starting at age 68.  Hepatitis C blood test.  Hepatitis B blood test.  Sexually transmitted disease (STD) testing.  Diabetes screening. This is done by checking your blood sugar (glucose) after you have not eaten for a while (fasting). You may have this done every 1-3 years.  Bone density scan. This is done to screen for osteoporosis. You may have this done starting at age 108.  Mammogram. This may be done every 1-2 years. Talk to your health care provider about how often you should have regular mammograms. Talk with your health care provider about your test results, treatment options, and if necessary, the need for more tests. Vaccines  Your health care provider may  recommend certain vaccines, such as:  Influenza vaccine. This is recommended every year.  Tetanus, diphtheria, and acellular pertussis (Tdap, Td) vaccine. You may need a Td booster every 10 years.  Zoster vaccine. You  may need this after age 66.  Pneumococcal 13-valent conjugate (PCV13) vaccine. One dose is recommended after age 72.  Pneumococcal polysaccharide (PPSV23) vaccine. One dose is recommended after age 63. Talk to your health care provider about which screenings and vaccines you need and how often you need them. This information is not intended to replace advice given to you by your health care provider. Make sure you discuss any questions you have with your health care provider. Document Released: 04/20/2015 Document Revised: 12/12/2015 Document Reviewed: 01/23/2015 Elsevier Interactive Patient Education  2017 [place] Prevention in the Home Falls can cause injuries. They can happen to people of all ages. There are many things you can do to make your home safe and to help prevent falls. What can I do on the outside of my home?  Regularly fix the edges of walkways and driveways and fix any cracks.  Remove anything that might make you trip as you walk through a door, such as a raised step or threshold.  Trim any bushes or trees on the path to your home.  Use bright outdoor lighting.  Clear any walking paths of anything that might make someone trip, such as rocks or tools.  Regularly check to see if handrails are loose or broken. Make sure that both sides of any steps have handrails.  Any raised decks and porches should have guardrails on the edges.  Have any leaves, snow, or ice cleared regularly.  Use sand or salt on walking paths during winter.  Clean up any spills in your garage right away. This includes oil or grease spills. What can I do in the bathroom?  Use night lights.  Install grab bars by the toilet and in the tub and shower. Do not use towel bars as grab bars.  Use non-skid mats or decals in the tub or shower.  If you need to sit down in the shower, use a plastic, non-slip stool.  Keep the floor dry. Clean up any water that spills on the floor as soon as  it happens.  Remove soap buildup in the tub or shower regularly.  Attach bath mats securely with double-sided non-slip rug tape.  Do not have throw rugs and other things on the floor that can make you trip. What can I do in the bedroom?  Use night lights.  Make sure that you have a light by your bed that is easy to reach.  Do not use any sheets or blankets that are too big for your bed. They should not hang down onto the floor.  Have a firm chair that has side arms. You can use this for support while you get dressed.  Do not have throw rugs and other things on the floor that can make you trip. What can I do in the kitchen?  Clean up any spills right away.  Avoid walking on wet floors.  Keep items that you use a lot in easy-to-reach places.  If you need to reach something above you, use a strong step stool that has a grab bar.  Keep electrical cords out of the way.  Do not use floor polish or wax that makes floors slippery. If you must use wax, use non-skid floor wax.  Do not have throw rugs and  other things on the floor that can make you trip. What can I do with my stairs?  Do not leave any items on the stairs.  Make sure that there are handrails on both sides of the stairs and use them. Fix handrails that are broken or loose. Make sure that handrails are as long as the stairways.  Check any carpeting to make sure that it is firmly attached to the stairs. Fix any carpet that is loose or worn.  Avoid having throw rugs at the top or bottom of the stairs. If you do have throw rugs, attach them to the floor with carpet tape.  Make sure that you have a light switch at the top of the stairs and the bottom of the stairs. If you do not have them, ask someone to add them for you. What else can I do to help prevent falls?  Wear shoes that:  Do not have high heels.  Have rubber bottoms.  Are comfortable and fit you well.  Are closed at the toe. Do not wear sandals.  If  you use a stepladder:  Make sure that it is fully opened. Do not climb a closed stepladder.  Make sure that both sides of the stepladder are locked into place.  Ask someone to hold it for you, if possible.  Clearly mark and make sure that you can see:  Any grab bars or handrails.  First and last steps.  Where the edge of each step is.  Use tools that help you move around (mobility aids) if they are needed. These include:  Canes.  Walkers.  Scooters.  Crutches.  Turn on the lights when you go into a dark area. Replace any light bulbs as soon as they burn out.  Set up your furniture so you have a clear path. Avoid moving your furniture around.  If any of your floors are uneven, fix them.  If there are any pets around you, be aware of where they are.  Review your medicines with your doctor. Some medicines can make you feel dizzy. This can increase your chance of falling. Ask your doctor what other things that you can do to help prevent falls. This information is not intended to replace advice given to you by your health care provider. Make sure you discuss any questions you have with your health care provider. Document Released: 01/18/2009 Document Revised: 08/30/2015 Document Reviewed: 04/28/2014 Elsevier Interactive Patient Education  2017 [employer].

## 2020-05-16 NOTE — Progress Notes (Signed)
Subjective:   Natalie [name] is a 65 y.o. female who presents for an Initial Medicare Annual Wellness Visit.  Virtual Visit via Telephone Note  I connected with  ALVIS PULCINI on 05/16/20 at 10:40 AM EST by telephone and verified that I am speaking with the correct person using two identifiers.  Location: Patient: home Provider: [facility] Persons participating in the virtual visit: [place]   I discussed the limitations, risks, security and privacy concerns of performing an evaluation and management service by telephone and the availability of in person appointments. The patient expressed understanding and agreed to proceed.  Interactive audio and video telecommunications were attempted between this nurse and patient, however failed, due to patient having technical difficulties OR patient did not have access to video capability.  We continued and completed visit with audio only.  Some vital signs may be absent or patient reported.   Clemetine Marker, LPN    Review of Systems     Cardiac Risk Factors include: advanced age (>68men, >53 women)     Objective:    There were no vitals filed for this visit. There is no height or weight on file to calculate BMI.  Advanced Directives 05/16/2020 03/10/2018 01/02/2017 03/14/2016 09/26/2015 09/18/2015 07/27/2015  Does Patient Have a Medical Advance Directive? Yes Yes No Yes Yes Yes No  Type of Paramedic of [place];Living will Healthcare Power of [place] of [place] -  Does patient want to make changes to medical advance directive? - No - Patient declined - No - Patient declined No - Patient declined No - Patient declined -  Copy of [place] in Chart? No - copy requested No - copy requested - Yes - No - copy requested -  Would patient like information on creating a medical advance directive? - - - - - - No -  patient declined information    Current Medications (verified) Outpatient Encounter Medications as of 05/16/2020  Medication Sig  . cholecalciferol (VITAMIN D) 1000 units tablet Take 1,000 Units by mouth daily.  . cyanocobalamin 1000 MCG tablet Take 1,000 mcg by mouth daily.  . melatonin 3 MG TABS tablet Take 3 mg by mouth at bedtime.  . mometasone (NASONEX) 50 MCG/ACT nasal spray Place 2 sprays into the nose daily.  . Multiple Vitamin (MULTIVITAMIN) tablet Take 1 tablet by mouth daily.  . polyethylene glycol (MIRALAX / GLYCOLAX) packet Take 17 g by mouth daily.  . vitamin C (ASCORBIC ACID) 500 MG tablet Take 500 mg by mouth daily.   No facility-administered encounter medications on file as of 05/16/2020.    Allergies (verified) Codeine, Doxycycline, Levofloxacin, Effexor [venlafaxine], Sulfa antibiotics, and Topamax [topiramate]   History: Past Medical History:  Diagnosis Date  . Benign neoplasm of sigmoid colon   . Chronic constipation   . Chronic sinusitis   . Cystitis   . Diverticulitis   . Perforated bowel ([place])   . Pleural effusion on right 2016  . Preventative health care 05/24/2015   Past Surgical History:  Procedure Laterality Date  . BREAST BIOPSY Right 03/15/2019   stereo biopsy/ x clip/ benign  . COLONOSCOPY WITH PROPOFOL N/A 07/27/2015   Procedure: COLONOSCOPY WITH PROPOFOL;  Surgeon: Lucilla Lame, MD;  Location: [facility];  Service: Endoscopy;  Laterality: N/A;  . COLONOSCOPY WITH PROPOFOL N/A 03/14/2016   Procedure: COLONOSCOPY WITH PROPOFOL;  Surgeon: Lucilla Lame, MD;  Location[facility];  Service: Endoscopy;  Laterality: N/A;  Cannot arrive before 0830 AM  . COLOSTOMY REVERSAL N/A 09/26/2015   Procedure: COLOSTOMY REVERSAL / TAKE DOWN;  Surgeon: Hubbard Robinson, MD;  Location: [facility];  Service: General;  Laterality: N/A;  . LAPAROTOMY N/A 03/01/2015   Procedure: EXPLORATORY LAPAROTOMY, SIGMOID COLECTOMY, COLOSTOMY;  Surgeon: Hubbard Robinson, MD;  Location: [facility];  Service: General;  Laterality: N/A;  . POLYPECTOMY  03/14/2016   Procedure: POLYPECTOMY;  Surgeon: Lucilla Lame, MD;  Location: [place];  Service: Endoscopy;;   Family History  Problem Relation Age of Onset  . Stroke Mother   . Arthritis Father   . Lung cancer Father   . Colon cancer Maternal Aunt   . Breast cancer Paternal Aunt   . Heart attack Brother    Social History   Socioeconomic History  . Marital status: Married    Spouse name: Not on file  . Number of children: 0  . Years of education: Not on file  . Highest education level: Not on file  Occupational History  . Occupation: Retired    Comment: Therapist, music  Tobacco Use  . Smoking status: Never Smoker  . Smokeless tobacco: Never Used  Vaping Use  . Vaping Use: Never used  Substance and Sexual Activity  . Alcohol use: No    Alcohol/week: 0.0 standard drinks  . Drug use: No  . Sexual activity: Not on file  Other Topics Concern  . Not on file  Social History Narrative   Married. No children of her own but has step children   Worked for [employer] in [place].   Is a Equities trader.   Enjoys spending time with family, reading, walking. She is raising her granddaughter.   Social Determinants of Health   Financial Resource Strain: Low Risk   . Difficulty of Paying Living Expenses: Not hard at all  Food Insecurity: No Food Insecurity  . Worried About Charity fundraiser in the Last Year: Never true  . Ran Out of Food in the Last Year: Never true  Transportation Needs: No Transportation Needs  . Lack of Transportation (Medical): No  . Lack of Transportation (Non-Medical): No  Physical Activity: Insufficiently Active  . Days of Exercise per Week: 4 days  . Minutes of Exercise per Session: 30 min  Stress: No Stress Concern Present  . Feeling of Stress : Not at all  Social Connections: Moderately Integrated  . Frequency of Communication with Friends and  Family: More than three times a week  . Frequency of Social Gatherings with Friends and Family: Twice a week  . Attends Religious Services: More than 4 times per year  . Active Member of Clubs or Organizations: No  . Attends Archivist Meetings: Never  . Marital Status: Married    Tobacco Counseling Counseling given: Not Answered   Clinical Intake:  Pre-visit preparation completed: Yes  Pain : No/denies pain     Nutritional Risks: None Diabetes: No  How often do you need to have someone help you when you read instructions, pamphlets, or other written materials from your doctor or pharmacy?: 1 - Never    Interpreter Needed?: No  Information entered by :: Clemetine Marker LPN   Activities of Daily Living In your present state of health, do you have any difficulty performing the following activities: 05/16/2020 03/14/2020  Hearing? N N  Comment declines hearing aids -  Vision? N N  Difficulty  concentrating or making decisions? N N  Walking or climbing stairs? N N  Dressing or bathing? N N  Doing errands, shopping? N N  Preparing Food and eating ? N -  Using the Toilet? N -  In the past six months, have you accidently leaked urine? Y -  Comment wears pads for protection occasionally -  Do you have problems with loss of bowel control? N -  Managing your Medications? N -  Managing your Finances? N -  Housekeeping or managing your Housekeeping? N -  Some recent data might be hidden    Patient Care Team: Glean Hess, MD as PCP - General (Internal Medicine) Vladimir Crofts, MD as Consulting Physician (Neurology) Clyde Canterbury, MD as Referring Physician (Otolaryngology) Lucilla Lame, MD as Consulting Physician (Gastroenterology) Sanjuana Kava, MD as Referring Physician (Otolaryngology)  Indicate any recent Medical Services you may have received from other than [facility] providers in the past year (date may be approximate).     Assessment:   This is a  routine wellness examination for [place].  Hearing/Vision screen  Hearing Screening   125Hz  250Hz  500Hz  1000Hz  2000Hz  3000Hz  4000Hz  6000Hz  8000Hz   Right ear:           Left ear:           Comments: Pt denies hearing difficulty  Vision Screening Comments: Annual vision screenings done at [facility] Dr. Edison Pace  Dietary issues and exercise activities discussed: Current Exercise Habits: Home exercise routine, Type of exercise: treadmill;walking, Time (Minutes): 30, Frequency (Times/Week): 4, Weekly Exercise (Minutes/Week): 120, Intensity: Moderate, Exercise limited by: None identified  Goals    . Weight (lb) < 150 lb (68 kg)     Patient states she would like to lose 5-10 lbs over the next year with healthy eating and physical activity       Depression Screen PHQ 2/9 Scores 05/16/2020 03/14/2020 02/14/2020 06/23/2019 05/31/2018 03/09/2018 12/28/2017  PHQ - 2 Score 0 0 0 0 0 1 0  PHQ- 9 Score - 0 0 0 - 7 0    Fall Risk Fall Risk  05/16/2020 03/14/2020 02/14/2020 06/23/2019 05/31/2018  Falls in the past year? 0 0 0 0 1  Number falls in past yr: 0 0 - 0 0  Injury with Fall? 0 0 - 0 1  Risk for fall due to : No Fall Risks - - - History of fall(s)  Follow up Falls prevention discussed Falls evaluation completed Falls evaluation completed Falls evaluation completed -    FALL RISK PREVENTION PERTAINING TO THE HOME:  Any stairs in or around the home? Yes  If so, are there any without handrails? No  Home free of loose throw rugs in walkways, pet beds, electrical cords, etc? Yes  Adequate lighting in your home to reduce risk of falls? Yes   ASSISTIVE DEVICES UTILIZED TO PREVENT FALLS:  Life alert? No  Use of a cane, walker or w/c? No  Grab bars in the bathroom? Yes  Shower chair or bench in shower? Yes  Elevated toilet seat or a handicapped toilet? No   TIMED UP AND GO:  Was the test performed? No . Telephonic visit.   Cognitive Function: Normal cognitive status assessed by direct  observation by this Nurse Health Advisor. No abnormalities found.          Immunizations Immunization History  Administered Date(s) Administered  . Influenza,inj,Quad PF,6+ Mos 02/05/2018  . Influenza-Unspecified 01/18/2015, 01/02/2016, 02/11/2017  . Tdap 06/27/2009  TDAP status: Due, Education has been provided regarding the importance of this vaccine. Advised may receive this vaccine at local pharmacy or Health Dept. Aware to provide a copy of the vaccination record if obtained from local pharmacy or Health Dept. Verbalized acceptance and understanding.  Flu Vaccine status: Declined, Education has been provided regarding the importance of this vaccine but patient still declined. Advised may receive this vaccine at local pharmacy or Health Dept. Aware to provide a copy of the vaccination record if obtained from local pharmacy or Health Dept. Verbalized acceptance and understanding.  Pneumococcal vaccine status: Declined,  Education has been provided regarding the importance of this vaccine but patient still declined. Advised may receive this vaccine at local pharmacy or Health Dept. Aware to provide a copy of the vaccination record if obtained from local pharmacy or Health Dept. Verbalized acceptance and understanding.   Covid-19 vaccine status: Declined, Education has been provided regarding the importance of this vaccine but patient still declined. Advised may receive this vaccine at local pharmacy or Health Dept.or vaccine clinic. Aware to provide a copy of the vaccination record if obtained from local pharmacy or Health Dept. Verbalized acceptance and understanding.  Qualifies for Shingles Vaccine? Yes   Zostavax completed No   Shingrix Completed?: No.    Education has been provided regarding the importance of this vaccine. Patient has been advised to call insurance company to determine out of pocket expense if they have not yet received this vaccine. Advised may also receive vaccine  at local pharmacy or Health Dept. Verbalized acceptance and understanding.  Screening Tests Health Maintenance  Topic Date Due  . COVID-19 Vaccine (1) 06/01/2020 (Originally 03/23/1961)  . PNA vac Low Risk Adult (1 of 2 - PCV13) 06/22/2020 (Originally 03/23/2019)  . INFLUENZA VACCINE  07/05/2020 (Originally 11/06/2019)  . TETANUS/TDAP  02/13/2021 (Originally 06/28/2019)  . MAMMOGRAM  02/16/2021  . COLONOSCOPY (Pts 45-60yrs Insurance coverage will need to be confirmed)  03/14/2021  . DEXA SCAN  Completed  . Hepatitis C Screening  Completed    Health Maintenance  There are no preventive care reminders to display for this patient.  Colorectal cancer screening: Type of screening: Colonoscopy. Completed 03/14/16. Repeat every 5 years  Mammogram status: Completed 02/17/19. Repeat every year  Bone Density status: Completed 09/22/19. Results reflect: Bone density results: OSTEOPENIA. Repeat every 2 years.  Lung Cancer Screening: (Low Dose CT Chest recommended if Age 61-80 years, 30 pack-year currently smoking OR have quit w/in 15years.) does not qualify.   Additional Screening:  Hepatitis C Screening: does qualify; Completed 05/31/18  Vision Screening: Recommended annual ophthalmology exams for early detection of glaucoma and other disorders of the eye. Is the patient up to date with their annual eye exam?  Yes  Who is the provider or what is the name of the office in which the patient attends annual eye exams? [place] Screening: Recommended annual dental exams for proper oral hygiene  Community Resource Referral / Chronic Care Management: CRR required this visit?  No   CCM required this visit?  No      Plan:     I have personally reviewed and noted the following in the patient's chart:   . Medical and social history . Use of alcohol, tobacco or illicit drugs  . Current medications and supplements . Functional ability and status . Nutritional  status . Physical activity . Advanced directives . List of other physicians . Hospitalizations, surgeries, and ER visits in previous 60  months . Vitals . Screenings to include cognitive, depression, and falls . Referrals and appointments  In addition, I have reviewed and discussed with patient certain preventive protocols, quality metrics, and best practice recommendations. A written personalized care plan for preventive services as well as general preventive health recommendations were provided to patient.     Clemetine Marker, LPN   06/07/6120   Nurse Notes: pt states she struggles with dizziness/ equilibrium and perception problems; seen by cardiology and neurology with normal results. Pt requests vitamin b12 level to be checked due to elevated levels in 2018. Patient is not driving due to these concerns that have been ongoing since colon surgery

## 2020-06-21 IMAGING — MG MM BREAST LOCALIZATION CLIP
4 series · 4 of 12 positions shown · non-contrast
Comparison: Previous exam(s).

CLINICAL DATA: Post biopsy mammogram of the right breast for clip
placement.

EXAM:
DIAGNOSTIC RIGHT MAMMOGRAM POST STEREOTACTIC BIOPSY

[R CC synth-2D]
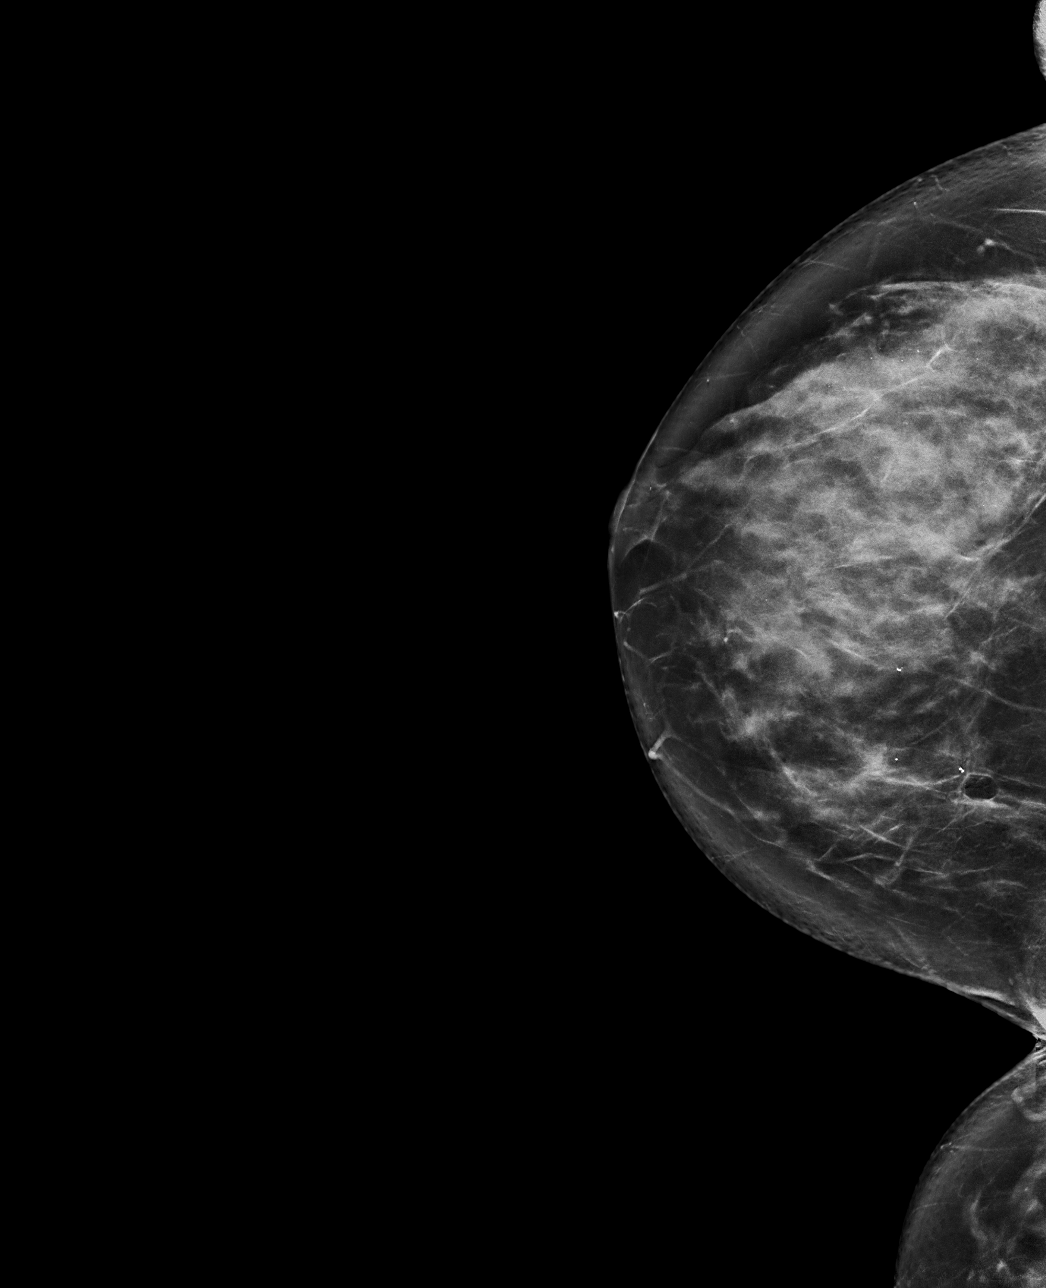

[R ML synth-2D]
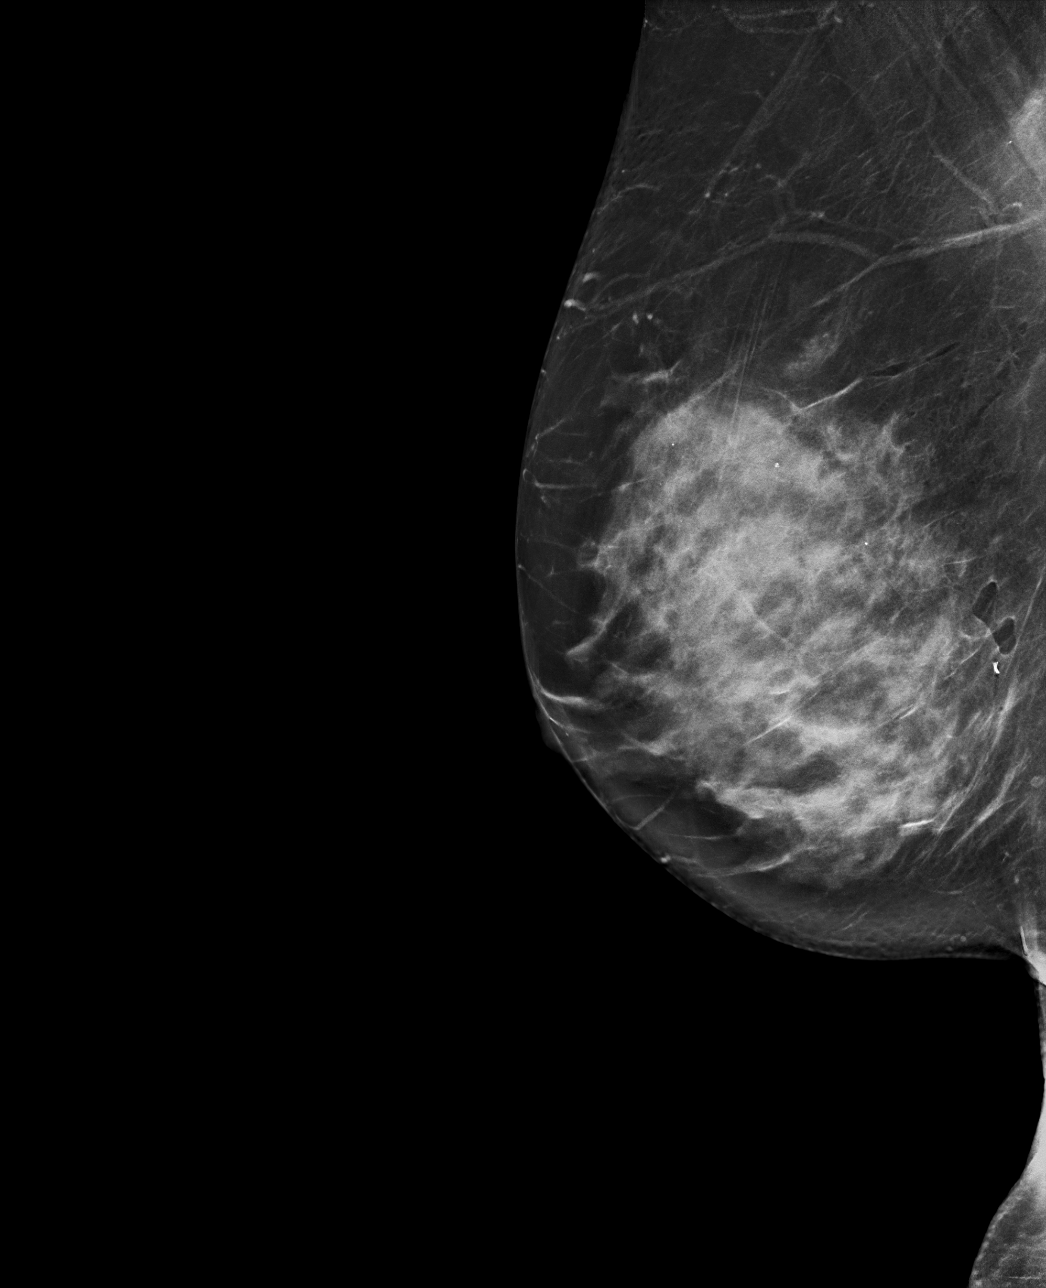

[R CC tomo · tomo slice 45/89.0]
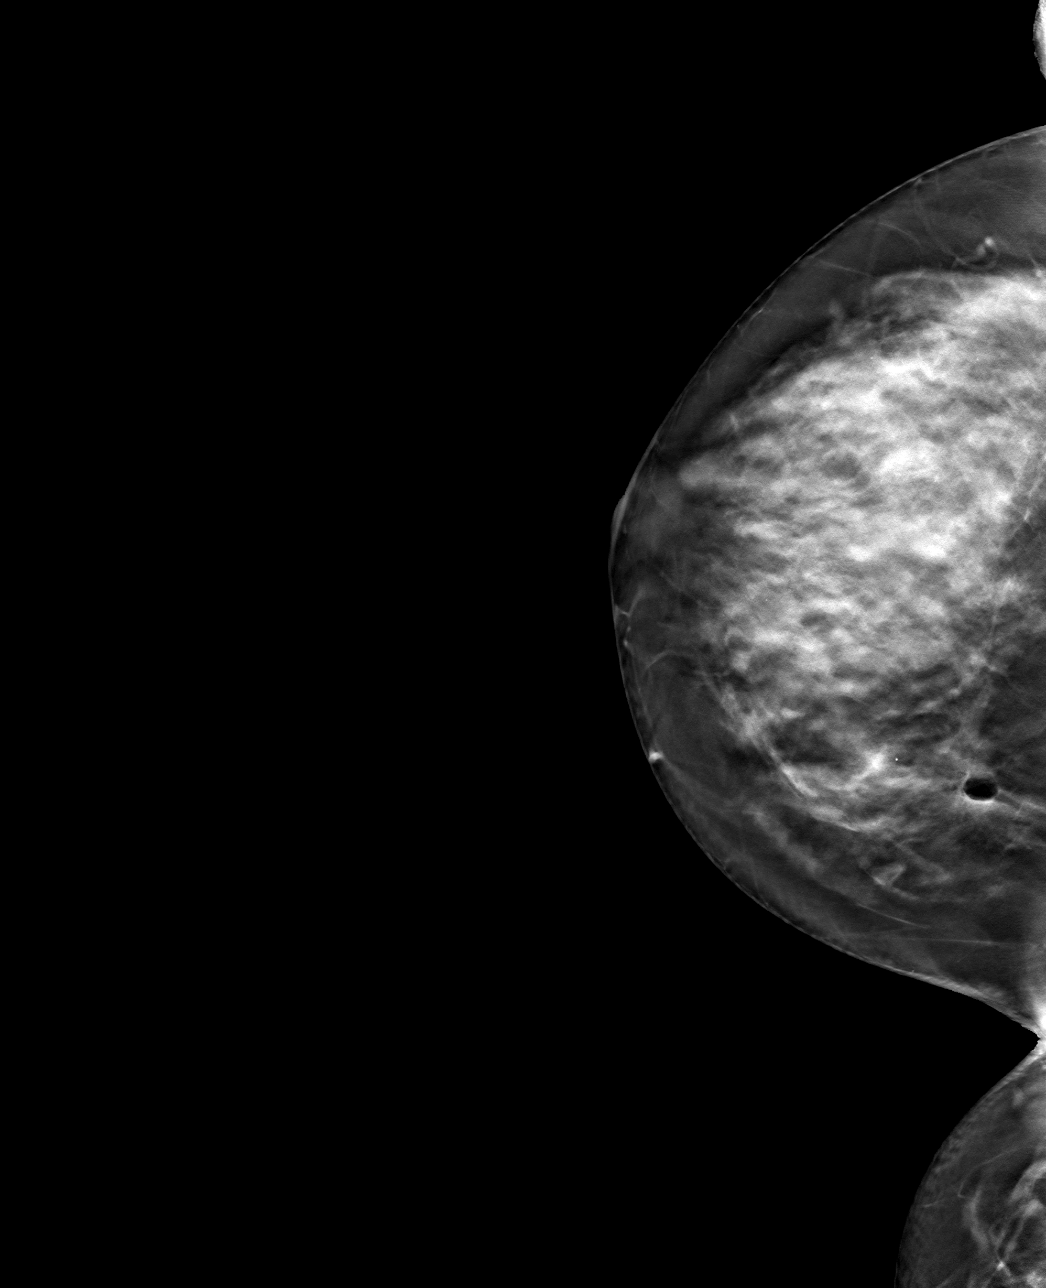

[R ML tomo · tomo slice 47/93.0]
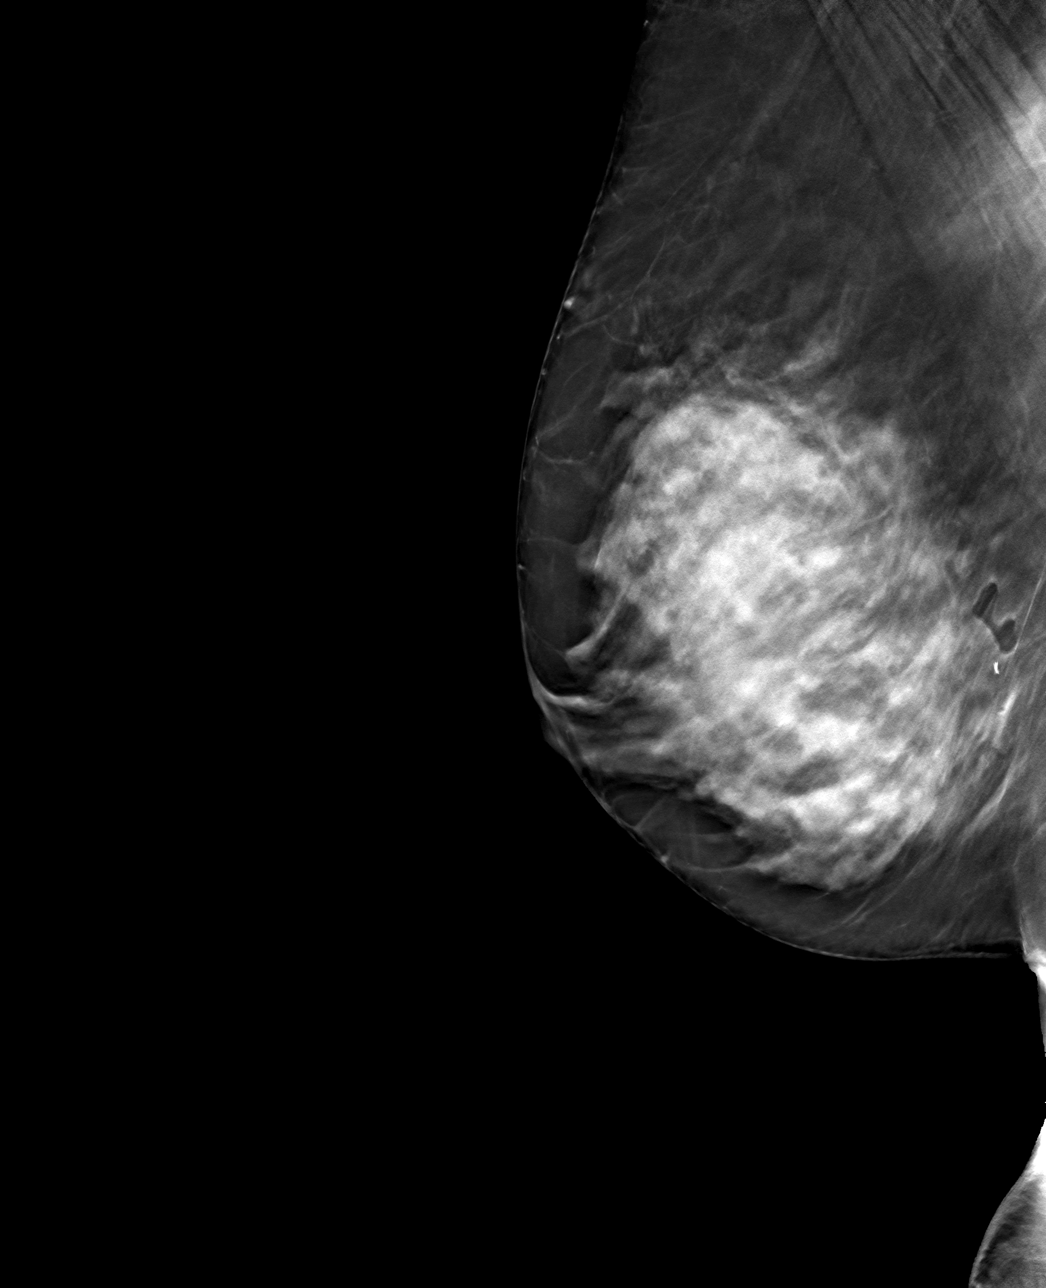

[4 of 12 positions shown; findings below may reference images not displayed]

FINDINGS: Mammographic images were obtained following stereotactic guided
biopsy of a mass in the lower-inner right breast. The biopsy marking
clip is in expected position at the site of biopsy.
IMPRESSION: Appropriate positioning of the X shaped biopsy marking clip at the
site of biopsy in the lower-inner right breast.

Final Assessment: Post Procedure Mammograms for Marker Placement

## 2020-06-25 ENCOUNTER — Other Ambulatory Visit: Payer: Self-pay

## 2020-06-25 ENCOUNTER — Ambulatory Visit (INDEPENDENT_AMBULATORY_CARE_PROVIDER_SITE_OTHER): Payer: Medicare HMO | Admitting: Internal Medicine

## 2020-06-25 ENCOUNTER — Encounter: Payer: Self-pay | Admitting: Internal Medicine

## 2020-06-25 VITALS — BP 132/82 | HR 74 | Temp 97.6°F | Ht 64.0 in | Wt 164.0 lb

## 2020-06-25 DIAGNOSIS — Z1211 Encounter for screening for malignant neoplasm of colon: Secondary | ICD-10-CM | POA: Diagnosis not present

## 2020-06-25 DIAGNOSIS — R42 Dizziness and giddiness: Secondary | ICD-10-CM

## 2020-06-25 DIAGNOSIS — Z0000 Encounter for general adult medical examination without abnormal findings: Secondary | ICD-10-CM

## 2020-06-25 DIAGNOSIS — G4761 Periodic limb movement disorder: Secondary | ICD-10-CM

## 2020-06-25 DIAGNOSIS — Z1231 Encounter for screening mammogram for malignant neoplasm of breast: Secondary | ICD-10-CM | POA: Diagnosis not present

## 2020-06-25 DIAGNOSIS — E7800 Pure hypercholesterolemia, unspecified: Secondary | ICD-10-CM | POA: Diagnosis not present

## 2020-06-25 DIAGNOSIS — M8580 Other specified disorders of bone density and structure, unspecified site: Secondary | ICD-10-CM | POA: Diagnosis not present

## 2020-06-25 DIAGNOSIS — G43809 Other migraine, not intractable, without status migrainosus: Secondary | ICD-10-CM

## 2020-06-25 MED ORDER — GABAPENTIN 100 MG PO CAPS: 100.0000 mg | ORAL_CAPSULE | Freq: Every day | ORAL | 0 refills | Status: DC

## 2020-06-25 NOTE — Patient Instructions (Signed)
Recommend calcium 1200 mg per day and Vitamin D 1000 IU daily plus weight bearing exercise.  Will repeat DEXA in 2-3 years.  

## 2020-06-25 NOTE — Progress Notes (Signed)
Date:  06/25/2020   Name:  Natalie [name]   DOB:  09/08/1955   MRN:  [id number]   Chief Complaint: Annual Exam (Breast exam no pap)  Natalie [name] is a 65 y.o. female who presents today for her Complete Annual Exam. She feels fairly well. She reports exercising walking X3 days a week. She reports she is sleeping well. Breast complaints none.  Mammogram: 09/2019 DEXA: 09/2019 osteopenia Pap smear: discontinued Colonoscopy: 03/2016 repeat 2022 Dr. Allen Norris  Immunization History  Administered Date(s) Administered  . Influenza,inj,Quad PF,6+ Mos 02/05/2018  . Influenza-Unspecified 01/18/2015, 01/02/2016, 02/11/2017  . Tdap 06/27/2009    Back Pain This is a recurrent problem. The current episode started yesterday. The problem has been gradually improving since onset. The pain is present in the sacro-iliac. The quality of the pain is described as aching. The pain does not radiate. Pertinent negatives include no abdominal pain, chest pain, dysuria, fever or headaches.    Sleep issues - sleep study showed no OSA but she did have PLMS with 26 events per hour. She does wake up 4-6 times per night and uses the rest room.  Still snoring.  Sleep seems adequate.  Lab Results  Component Value Date   CREATININE 0.76 06/23/2019   BUN 15 06/23/2019   NA 142 06/23/2019   K 4.8 06/23/2019   CL 103 06/23/2019   CO2 24 06/23/2019   Lab Results  Component Value Date   CHOL 195 06/23/2019   HDL 74 06/23/2019   LDLCALC 105 (H) 06/23/2019   TRIG 88 06/23/2019   CHOLHDL 2.6 06/23/2019   Lab Results  Component Value Date   TSH 2.250 06/23/2019   Lab Results  Component Value Date   HGBA1C 5.5 11/27/2017   Lab Results  Component Value Date   WBC 5.6 06/23/2019   HGB 14.0 06/23/2019   HCT 39.9 06/23/2019   MCV 94 06/23/2019   PLT 280 06/23/2019   Lab Results  Component Value Date   ALT 14 06/23/2019   AST 21 06/23/2019   ALKPHOS 95 06/23/2019   BILITOT 0.5 06/23/2019      Review of Systems  Constitutional: Negative for chills, fatigue and fever.  HENT: Negative for congestion, hearing loss, tinnitus, trouble swallowing and voice change.   Eyes: Negative for visual disturbance.  Respiratory: Negative for cough, chest tightness, shortness of breath and wheezing.   Cardiovascular: Negative for chest pain, palpitations and leg swelling.  Gastrointestinal: Negative for abdominal pain, constipation, diarrhea and vomiting.  Endocrine: Negative for polydipsia and polyuria.  Genitourinary: Negative for dysuria, frequency, genital sores, urgency, vaginal bleeding and vaginal discharge.  Musculoskeletal: Positive for back pain (new due to strain). Negative for arthralgias, gait problem and joint swelling.  Skin: Negative for color change and rash.  Neurological: Positive for dizziness (no longer driving as this exacerbates the sx). Negative for tremors, light-headedness and headaches.  Hematological: Negative for adenopathy. Does not bruise/bleed easily.  Psychiatric/Behavioral: Negative for dysphoric mood and sleep disturbance. The patient is not nervous/anxious.     Patient Active Problem List   Diagnosis Date Noted  . Periodic limb movement sleep disorder 03/14/2020  . Osteopenia determined by x-ray 09/22/2019  . Elevated LDL cholesterol level 07/18/2019  . Vestibular migraine 11/27/2017  . Hx of diverticulitis of colon   . Benign neoplasm of ascending colon   . History of colostomy reversal     Allergies  Allergen Reactions  . Codeine Nausea Only  . Doxycycline Nausea  And Vomiting  . Levofloxacin Other (See Comments)    insomnia  . Effexor [Venlafaxine] Nausea Only    And dizziness  . Sulfa Antibiotics Rash  . Topamax [Topiramate] Other (See Comments)    sedation    Past Surgical History:  Procedure Laterality Date  . BREAST BIOPSY Right 03/15/2019   stereo biopsy/ x clip/ benign  . COLONOSCOPY WITH PROPOFOL N/A 07/27/2015   Procedure:  COLONOSCOPY WITH PROPOFOL;  Surgeon: Lucilla Lame, MD;  Location: [place];  Service: Endoscopy;  Laterality: N/A;  . COLONOSCOPY WITH PROPOFOL N/A 03/14/2016   Procedure: COLONOSCOPY WITH PROPOFOL;  Surgeon: Lucilla Lame, MD;  Location: [place];  Service: Endoscopy;  Laterality: N/A;  Cannot arrive before 0830 AM  . COLOSTOMY REVERSAL N/A 09/26/2015   Procedure: COLOSTOMY REVERSAL / TAKE DOWN;  Surgeon: Hubbard Robinson, MD;  Location: [facility];  Service: General;  Laterality: N/A;  . LAPAROTOMY N/A 03/01/2015   Procedure: EXPLORATORY LAPAROTOMY, SIGMOID COLECTOMY, COLOSTOMY;  Surgeon: Hubbard Robinson, MD;  Location: [facility];  Service: General;  Laterality: N/A;  . POLYPECTOMY  03/14/2016   Procedure: POLYPECTOMY;  Surgeon: Lucilla Lame, MD;  Location: [place];  Service: Endoscopy;;    Social History   Tobacco Use  . Smoking status: Never Smoker  . Smokeless tobacco: Never Used  Vaping Use  . Vaping Use: Never used  Substance Use Topics  . Alcohol use: No    Alcohol/week: 0.0 standard drinks  . Drug use: No     Medication list has been reviewed and updated.  Current Meds  Medication Sig  . cholecalciferol (VITAMIN D) 1000 units tablet Take 1,000 Units by mouth daily.  . melatonin 3 MG TABS tablet Take 3 mg by mouth at bedtime.  . mometasone (NASONEX) 50 MCG/ACT nasal spray Place 2 sprays into the nose daily.  . Multiple Vitamin (MULTIVITAMIN) tablet Take 1 tablet by mouth daily.  . polyethylene glycol (MIRALAX / GLYCOLAX) packet Take 17 g by mouth daily.  . vitamin C (ASCORBIC ACID) 500 MG tablet Take 500 mg by mouth daily.    PHQ 2/9 Scores 06/25/2020 05/16/2020 03/14/2020 02/14/2020  PHQ - 2 Score 0 0 0 0  PHQ- 9 Score 0 - 0 0    GAD 7 : Generalized Anxiety Score 06/25/2020 03/14/2020 02/14/2020  Nervous, Anxious, on Edge 0 0 0  Control/stop worrying 0 0 0  Worry too much - different things 0 0 0  Trouble relaxing 0 0 0  Restless 0 0 0   Easily annoyed or irritable 0 0 0  Afraid - awful might happen 0 0 0  Total GAD 7 Score 0 0 0  Anxiety Difficulty - Not difficult at all -    BP Readings from Last 3 Encounters:  06/25/20 132/82  03/14/20 124/64  02/14/20 112/74    Physical Exam Vitals and nursing note reviewed.  Constitutional:      General: She is not in acute distress.    Appearance: She is well-developed.  HENT:     Head: Normocephalic and atraumatic.     Right Ear: Tympanic membrane and ear canal normal.     Left Ear: Tympanic membrane and ear canal normal.     Nose:     Right Sinus: No maxillary sinus tenderness.     Left Sinus: No maxillary sinus tenderness.  Eyes:     General: No scleral icterus.       Right eye: No discharge.  Left eye: No discharge.     Conjunctiva/sclera: Conjunctivae normal.  Neck:     Thyroid: No thyromegaly.     Vascular: No carotid bruit.  Cardiovascular:     Rate and Rhythm: Normal rate and regular rhythm.     Pulses: Normal pulses.     Heart sounds: Normal heart sounds.  Pulmonary:     Effort: Pulmonary effort is normal. No respiratory distress.     Breath sounds: No wheezing.  Chest:  Breasts:     Right: No mass, nipple discharge, skin change or tenderness.     Left: No mass, nipple discharge, skin change or tenderness.    Abdominal:     General: Bowel sounds are normal.     Palpations: Abdomen is soft.     Tenderness: There is no abdominal tenderness.  Musculoskeletal:     Cervical back: Normal range of motion. No erythema.     Lumbar back: Spasms and tenderness present. Negative right straight leg raise test and negative left straight leg raise test.     Right lower leg: No edema.     Left lower leg: No edema.  Lymphadenopathy:     Cervical: No cervical adenopathy.  Skin:    General: Skin is warm and dry.     Findings: No rash.  Neurological:     Mental Status: She is alert and oriented to person, place, and time.     Cranial Nerves: No cranial  nerve deficit.     Sensory: No sensory deficit.     Deep Tendon Reflexes: Reflexes are normal and symmetric.  Psychiatric:        Attention and Perception: Attention normal.        Mood and Affect: Mood normal.     Wt Readings from Last 3 Encounters:  06/25/20 164 lb (74.4 kg)  03/14/20 161 lb (73 kg)  02/14/20 160 lb (72.6 kg)    BP 132/82   Pulse 74   Temp 97.6 F (36.4 C) (Oral)   Ht 5\' 4"  (1.626 m)   Wt 164 lb (74.4 kg)   SpO2 99%   BMI 28.15 kg/m   Assessment and Plan: 1. Annual physical exam Normal exam Continue healthy diet, exercise as able Pt would like to wait for Prevnar 20. - TSH  2. Encounter for screening mammogram for breast cancer Schedule ASAP - MM 3D SCREEN BREAST BILATERAL; Future  3. Colon cancer screening Due for colonoscopy later this year with Dr. Allen Norris  4. Osteopenia determined by x-ray Recommend calcium and vitamin D - VITAMIN D 25 Hydroxy (Vit-D Deficiency, Fractures)  5. Periodic limb movement sleep disorder Begin low dose gabapentin at bedtime - CBC with Differential/Platelet - Comprehensive metabolic panel - gabapentin (NEURONTIN) 100 MG capsule; Take 1 capsule (100 mg total) by mouth at bedtime.  Dispense: 30 capsule; Refill: 0  6. Elevated LDL cholesterol level Check labs - Lipid panel  7. Dizziness, nonspecific Evaluated by ENT and Neurology Concerned about ineffective B12 and folate contributing - Vitamin B12 - Folate  8. Vestibular migraine Suggested by Neurology - Homocysteine - Methylmalonic Acid   Partially dictated using Dragon software. Any errors are unintentional.  Halina Maidens, MD [facility]  06/25/2020

## 2020-06-27 LAB — COMPREHENSIVE METABOLIC PANEL
ALT: 15 IU/L (ref 0–32)
AST: 22 IU/L (ref 0–40)
Albumin/Globulin Ratio: 1.6 (ref 1.2–2.2)
Albumin: 4.5 g/dL (ref 3.8–4.8)
Alkaline Phosphatase: 101 IU/L (ref 44–121)
BUN/Creatinine Ratio: 16 (ref 12–28)
BUN: 15 mg/dL (ref 8–27)
Bilirubin Total: 0.4 mg/dL (ref 0.0–1.2)
CO2: 22 mmol/L (ref 20–29)
Calcium: 9.5 mg/dL (ref 8.7–10.3)
Chloride: 102 mmol/L (ref 96–106)
Creatinine, Ser: 0.92 mg/dL (ref 0.57–1.00)
Globulin, Total: 2.8 g/dL (ref 1.5–4.5)
Glucose: 92 mg/dL (ref 65–99)
Potassium: 4.7 mmol/L (ref 3.5–5.2)
Sodium: 141 mmol/L (ref 134–144)
Total Protein: 7.3 g/dL (ref 6.0–8.5)
eGFR: 69 mL/min/{1.73_m2} (ref >59)

## 2020-06-27 LAB — HOMOCYSTEINE: Homocysteine: 9.7 umol/L (ref 0.0–17.2)

## 2020-06-27 LAB — METHYLMALONIC ACID, SERUM: Methylmalonic Acid: 187 nmol/L (ref 0–378)

## 2020-06-27 LAB — TSH: TSH: 3.57 u[IU]/mL (ref 0.450–4.500)

## 2020-06-27 LAB — CBC WITH DIFFERENTIAL/PLATELET
Basophils Absolute: 0.1 10*3/uL (ref 0.0–0.2)
Basos: 1 %
EOS (ABSOLUTE): 0.5 10*3/uL — ABNORMAL HIGH (ref 0.0–0.4)
Eos: 7 %
Hematocrit: 41.9 % (ref 34.0–46.6)
Hemoglobin: 14.4 g/dL (ref 11.1–15.9)
Immature Grans (Abs): 0 10*3/uL (ref 0.0–0.1)
Immature Granulocytes: 0 %
Lymphocytes Absolute: 1.9 10*3/uL (ref 0.7–3.1)
Lymphs: 26 %
MCH: 33 pg (ref 26.6–33.0)
MCHC: 34.4 g/dL (ref 31.5–35.7)
MCV: 96 fL (ref 79–97)
Monocytes Absolute: 0.7 10*3/uL (ref 0.1–0.9)
Monocytes: 9 %
Neutrophils Absolute: 4.2 10*3/uL (ref 1.4–7.0)
Neutrophils: 57 %
Platelets: 278 10*3/uL (ref 150–450)
RBC: 4.37 x10E6/uL (ref 3.77–5.28)
RDW: 12 % (ref 11.7–15.4)
WBC: 7.4 10*3/uL (ref 3.4–10.8)

## 2020-06-27 LAB — FOLATE: Folate: 19.3 ng/mL (ref >3.0)

## 2020-06-27 LAB — VITAMIN B12: Vitamin B-12: 1135 pg/mL (ref 232–1245)

## 2020-06-27 LAB — LIPID PANEL
Chol/HDL Ratio: 3 ratio (ref 0.0–4.4)
Cholesterol, Total: 212 mg/dL — ABNORMAL HIGH (ref 100–199)
HDL: 70 mg/dL (ref >39)
LDL Chol Calc (NIH): 118 mg/dL — ABNORMAL HIGH (ref 0–99)
Triglycerides: 136 mg/dL (ref 0–149)
VLDL Cholesterol Cal: 24 mg/dL (ref 5–40)

## 2020-06-27 LAB — VITAMIN D 25 HYDROXY (VIT D DEFICIENCY, FRACTURES): Vit D, 25-Hydroxy: 50.3 ng/mL (ref 30.0–100.0)

## 2020-07-17 ENCOUNTER — Other Ambulatory Visit: Payer: Self-pay | Admitting: Internal Medicine

## 2020-07-17 DIAGNOSIS — G4761 Periodic limb movement disorder: Secondary | ICD-10-CM

## 2020-07-17 MED ORDER — GABAPENTIN 100 MG PO CAPS: 100.0000 mg | ORAL_CAPSULE | Freq: Every day | ORAL | 0 refills | Status: DC

## 2020-07-17 NOTE — Telephone Encounter (Signed)
  Notes to clinic: Patient would like a 90 day supply because she is working Review for change and refill    Requested Prescriptions  Pending Prescriptions Disp Refills   gabapentin (NEURONTIN) 100 MG capsule 30 capsule 0    Sig: Take 1 capsule (100 mg total) by mouth at bedtime.      Neurology: Anticonvulsants - gabapentin Passed - 07/17/2020  9:39 AM      Passed - Valid encounter within last 12 months    Recent Outpatient Visits           3 weeks ago Annual physical exam   [facility] Glean Hess, MD   4 months ago Sleep-disordered breathing   [facility] Glean Hess, MD   5 months ago Acute non-recurrent maxillary sinusitis   [facility] Glean Hess, MD   1 year ago Annual physical exam   [facility] Glean Hess, MD   2 years ago Annual physical exam   [facility] Glean Hess, MD       Future Appointments             In 4 months Army Melia Jesse Sans, MD [facility], [place]   In 11 months Army Melia Jesse Sans, MD [facility], [employer]

## 2020-07-17 NOTE — Telephone Encounter (Signed)
Patient wanted to inform PCP gabapentin (NEURONTIN) 100 MG capsule (new pharmacy advised patient to contact PCP office) is working and would like a 3 month supply sent into [facility].

## 2020-07-26 ENCOUNTER — Ambulatory Visit: Payer: Self-pay | Admitting: *Deleted

## 2020-07-26 ENCOUNTER — Telehealth: Payer: Self-pay | Admitting: Internal Medicine

## 2020-07-26 NOTE — Telephone Encounter (Signed)
Please advise for on-going symptoms.  See note below.

## 2020-07-26 NOTE — Telephone Encounter (Signed)
Pt called with complaints of dizziness and a sinus infection; vertigo on 07/22/20 which has gotten better; she has sinus congestion with thick yellow secretions; the pt says she has intermittent left ear pain that started 07/22/20; she denies fever; the pt took meclizine 4/17 and 4/18; recommendations made per nurse triage protocol; the pt verbalized understanding and says she will call back if she worsens; the pt would like to have Dr Army Melia review this information and possibly send her to ENT; the pt can be contacted at [phone]; pt declined possible appt with another provider since Dr Army Melia is not in the office; will route to office for notification and provider review.  Reason for Disposition . Earache  Answer Assessment - Initial Assessment Questions 1. DESCRIPTION: "Describe your dizziness."     Feels off balance 2. VERTIGO: "Do you feel like either you or the room is spinning or tilting?"   yes; took meclizine on 4/17/ and 4/18 3. LIGHTHEADED: "Do you feel lightheaded?" (e.g., somewhat faint, woozy, weak upon standing)     Light headed when stand up intermittently 4. SEVERITY: "How bad is it?"  "Can you walk?"   - MILD: Feels unsteady but walking normally.   - MODERATE: Feels very unsteady when walking, but not falling; interferes with normal activities (e.g., school, work) .   - SEVERE: Unable to walk without falling, or requires assistance to walk without falling.     Mild; pt took Mcelizine on 4/17 and 4/18 5. ONSET:  "When did the dizziness begin?"     07/22/20 6. AGGRAVATING FACTORS: "Does anything make it worse?" (e.g., standing, change in head position)     standing 7. CAUSE: "What do you think is causing the dizziness?"   Sinus infection 8. RECURRENT SYMPTOM: "Have you had dizziness before?" If Yes, ask: "When was the last time?" "What happened that time?"     Sinus infection previously 9. OTHER SYMPTOMS: "Do you have any other symptoms?" (e.g., headache, weakness,  numbness, vomiting, earache)     Left ear pain; sinus congestion with thick yellow secretions 10. PREGNANCY: "Is there any chance you are pregnant?" "When was your last menstrual period?"      n/a  Protocols used: DIZZINESS - VERTIGO-A-AH

## 2020-07-26 NOTE — Telephone Encounter (Signed)
Will try mucinex, nasal spray and saline- put on schedule for Tuesday

## 2020-07-26 NOTE — Telephone Encounter (Signed)
error:[id number] ° °

## 2020-07-30 ENCOUNTER — Ambulatory Visit: Payer: Medicare HMO | Admitting: Internal Medicine

## 2020-07-31 ENCOUNTER — Ambulatory Visit (INDEPENDENT_AMBULATORY_CARE_PROVIDER_SITE_OTHER): Payer: Medicare HMO | Admitting: Internal Medicine

## 2020-07-31 ENCOUNTER — Encounter: Payer: Self-pay | Admitting: Internal Medicine

## 2020-07-31 ENCOUNTER — Other Ambulatory Visit: Payer: Self-pay

## 2020-07-31 VITALS — BP 118/82 | HR 79 | Temp 98.0°F | Ht 64.0 in | Wt 162.0 lb

## 2020-07-31 DIAGNOSIS — R42 Dizziness and giddiness: Secondary | ICD-10-CM

## 2020-07-31 DIAGNOSIS — J019 Acute sinusitis, unspecified: Secondary | ICD-10-CM | POA: Diagnosis not present

## 2020-07-31 DIAGNOSIS — G4761 Periodic limb movement disorder: Secondary | ICD-10-CM | POA: Diagnosis not present

## 2020-07-31 MED ORDER — GABAPENTIN 100 MG PO CAPS: 200.0000 mg | ORAL_CAPSULE | Freq: Every day | ORAL | 0 refills | Status: DC

## 2020-07-31 MED ORDER — AMOXICILLIN-POT CLAVULANATE 875-125 MG PO TABS: 1.0000 | ORAL_TABLET | Freq: Two times a day (BID) | ORAL | 0 refills | Status: AC

## 2020-07-31 NOTE — Progress Notes (Signed)
Date:  07/31/2020   Name:  Natalie [name]   DOB:  Oct 29, 1955   MRN:  [id number]   Chief Complaint: Sinusitis (X2 weeks, vertigo, congested, pressure in left ear and pain as well, headaches,yellow mucous when coughing, runny eyes)  Sinusitis This is a new problem. The current episode started 1 to 4 weeks ago. The problem is unchanged. There has been no fever. Associated symptoms include congestion, ear pain, headaches and sinus pressure. Pertinent negatives include no chills, coughing or shortness of breath.   PLMS - gabapentin has been very helpful but if she takes it every day her dizziness is worse.  She [facility].  [facility]  Dizziness - has has neuro work up, ENT - no cause found.  Unable to drive due to this.  She was hoping it would just resolve but it has not.     Lab Results  Component Value Date   CREATININE 0.92 06/25/2020   BUN 15 06/25/2020   NA 141 06/25/2020   K 4.7 06/25/2020   CL 102 06/25/2020   CO2 22 06/25/2020   Lab Results  Component Value Date   CHOL 212 (H) 06/25/2020   HDL 70 06/25/2020   LDLCALC 118 (H) 06/25/2020   TRIG 136 06/25/2020   CHOLHDL 3.0 06/25/2020   Lab Results  Component Value Date   TSH 3.570 06/25/2020   Lab Results  Component Value Date   HGBA1C 5.5 11/27/2017   Lab Results  Component Value Date   WBC 7.4 06/25/2020   HGB 14.4 06/25/2020   HCT 41.9 06/25/2020   MCV 96 06/25/2020   PLT 278 06/25/2020   Lab Results  Component Value Date   ALT 15 06/25/2020   AST 22 06/25/2020   ALKPHOS 101 06/25/2020   BILITOT 0.4 06/25/2020     Review of Systems  Constitutional: Negative for chills, fatigue and fever.  HENT: Positive for congestion, ear pain and sinus pressure.   Respiratory: Negative for cough, chest tightness and shortness of breath.   Cardiovascular: Negative for chest pain and palpitations.  Neurological: Positive for  dizziness, light-headedness and headaches.  Psychiatric/Behavioral: Positive for sleep disturbance. Negative for dysphoric mood. The patient is not nervous/anxious.     Patient Active Problem List   Diagnosis Date Noted  . Periodic limb movement sleep disorder 03/14/2020  . Osteopenia determined by x-ray 09/22/2019  . Elevated LDL cholesterol level 07/18/2019  . Vestibular migraine 11/27/2017  . Hx of diverticulitis of colon   . Benign neoplasm of ascending colon   . History of colostomy reversal     Allergies  Allergen Reactions  . Codeine Nausea Only  . Doxycycline Nausea And Vomiting  . Levofloxacin Other (See Comments)    insomnia  . Effexor [Venlafaxine] Nausea Only    And dizziness  . Sulfa Antibiotics Rash  . Topamax [Topiramate] Other (See Comments)    sedation    Past Surgical History:  Procedure Laterality Date  . BREAST BIOPSY Right 03/15/2019   stereo biopsy/ x clip/ benign  . COLONOSCOPY WITH PROPOFOL N/A 07/27/2015   Procedure: COLONOSCOPY WITH PROPOFOL;  Surgeon: Lucilla Lame, MD;  Location: [place];  Service: Endoscopy;  Laterality: N/A;  . COLONOSCOPY WITH PROPOFOL N/A 03/14/2016   Procedure: COLONOSCOPY WITH PROPOFOL;  Surgeon: Lucilla Lame, MD;  Location: [place];  Service: Endoscopy;  Laterality: N/A;  Cannot arrive  before 0830 AM  . COLOSTOMY REVERSAL N/A 09/26/2015   Procedure: COLOSTOMY REVERSAL / TAKE DOWN;  Surgeon: Hubbard Robinson, MD;  Location: [facility];  Service: General;  Laterality: N/A;  . LAPAROTOMY N/A 03/01/2015   Procedure: EXPLORATORY LAPAROTOMY, SIGMOID COLECTOMY, COLOSTOMY;  Surgeon: Hubbard Robinson, MD;  Location: [facility];  Service: General;  Laterality: N/A;  . POLYPECTOMY  03/14/2016   Procedure: POLYPECTOMY;  Surgeon: Lucilla Lame, MD;  Location: [place];  Service: Endoscopy;;    Social History   Tobacco Use  . Smoking status: Never Smoker  . Smokeless tobacco: Never Used  Vaping Use  .  Vaping Use: Never used  Substance Use Topics  . Alcohol use: No    Alcohol/week: 0.0 standard drinks  . Drug use: No     Medication list has been reviewed and updated.  Current Meds  Medication Sig  . cholecalciferol (VITAMIN D) 1000 units tablet Take 1,000 Units by mouth daily.  . cyanocobalamin 1000 MCG tablet Take 1,000 mcg by mouth daily.  Marland Kitchen gabapentin (NEURONTIN) 100 MG capsule Take 1 capsule (100 mg total) by mouth at bedtime.  . melatonin 3 MG TABS tablet Take 3 mg by mouth at bedtime.  . mometasone (NASONEX) 50 MCG/ACT nasal spray Place 2 sprays into the nose daily.  . Multiple Vitamin (MULTIVITAMIN) tablet Take 1 tablet by mouth daily.  . polyethylene glycol (MIRALAX / GLYCOLAX) packet Take 17 g by mouth daily.  . vitamin C (ASCORBIC ACID) 500 MG tablet Take 500 mg by mouth daily.    PHQ 2/9 Scores 07/31/2020 06/25/2020 05/16/2020 03/14/2020  PHQ - 2 Score 0 0 0 0  PHQ- 9 Score 0 0 - 0    GAD 7 : Generalized Anxiety Score 07/31/2020 06/25/2020 03/14/2020 02/14/2020  Nervous, Anxious, on Edge 0 0 0 0  Control/stop worrying 0 0 0 0  Worry too much - different things 0 0 0 0  Trouble relaxing 0 0 0 0  Restless 0 0 0 0  Easily annoyed or irritable 0 0 0 0  Afraid - awful might happen 0 0 0 0  Total GAD 7 Score 0 0 0 0  Anxiety Difficulty - - Not difficult at all -    BP Readings from Last 3 Encounters:  07/31/20 118/82  06/25/20 132/82  03/14/20 124/64    Physical Exam Constitutional:      Appearance: She is well-developed.  HENT:     Right Ear: Ear canal and external ear normal. Tympanic membrane is not erythematous or retracted.     Left Ear: Ear canal and external ear normal. Tympanic membrane is not erythematous or retracted.     Nose:     Right Sinus: Maxillary sinus tenderness and frontal sinus tenderness present.     Left Sinus: Maxillary sinus tenderness and frontal sinus tenderness present.     Mouth/Throat:     Mouth: No oral lesions.     Pharynx: Uvula  midline. Posterior oropharyngeal erythema present. No oropharyngeal exudate.  Cardiovascular:     Rate and Rhythm: Normal rate and regular rhythm.     Heart sounds: Normal heart sounds.  Pulmonary:     Breath sounds: Normal breath sounds. No wheezing or rales.  Musculoskeletal:     Cervical back: Normal range of motion.  Lymphadenopathy:     Cervical: No cervical adenopathy.  Neurological:     Mental Status: She is alert and oriented to person, place, and time.  Wt Readings from Last 3 Encounters:  07/31/20 162 lb (73.5 kg)  06/25/20 164 lb (74.4 kg)  03/14/20 161 lb (73 kg)    BP 118/82   Pulse 79   Temp 98 F (36.7 C) (Oral)   Ht 5\' 4"  (1.626 m)   Wt 162 lb (73.5 kg)   SpO2 97%   BMI 27.81 kg/m   Assessment and Plan: 1. Acute sinusitis, recurrence not specified, unspecified location Continue steroid nasal spray Take pseudo fed pediatric dose - amoxicillin-clavulanate (AUGMENTIN) 875-125 MG tablet; Take 1 tablet by mouth 2 (two) times daily for 10 days.  Dispense: 20 tablet; Refill: 0  2. Periodic limb movement sleep disorder [facility] - [facility] Rx sent Can also take it only as needed rather than nightly - gabapentin (NEURONTIN) 100 MG capsule; Take 2 capsules (200 mg total) by mouth at bedtime.  Dispense: 60 capsule; Refill: 0  3. Dizziness, nonspecific Recommend that she contact Neurology Dr. Manuella Ghazi again to see if he has any other recommendations   Partially dictated using Dragon software. Any errors are unintentional.  Halina Maidens, MD [facility]  07/31/2020

## 2020-10-23 DIAGNOSIS — Z20822 Contact with and (suspected) exposure to covid-19: Secondary | ICD-10-CM | POA: Diagnosis not present

## 2020-10-25 ENCOUNTER — Ambulatory Visit: Payer: Self-pay | Admitting: *Deleted

## 2020-10-25 NOTE — Telephone Encounter (Signed)
Called pt left her know that he risk factor is not high enough for Covid medication. Told pt we cough see her for her cough and rattling in chest. Pt verbalized understanding. Offered to schedule pt a VV appointment pt stated she she will see how the night goes and if she gets worse she will call us for an appointment.  KP

## 2020-10-25 NOTE — Telephone Encounter (Signed)
Pt reports tested positive for covid, [facility], resulted today. Symptoms started Tuesday, husband also positive. Pt reports congestion, moderate,productive cough, "Yellowish at times" fatigue, sore throat, "Chest rattling at times."  Worse symptom, "Body aches." Reports subjective fever "Probably low grade last night, chills, sweating, didn't check temp."  Denies any SOB, no CP. Pt is interested in oral anti -viral meds; states husband is taking.Reviewed guidelines for self isolation as well as symptoms that warrant an ED visit. Home care advise given. Assured pt NT would route to practice for PCPs review and final disposition. Pt verbalizes understanding.  Please advise: [phone]

## 2020-10-25 NOTE — Telephone Encounter (Signed)
Reason for Disposition  [1] COVID-19 diagnosed by positive lab test (e.g., PCR, rapid self-test kit) AND [2] mild symptoms (e.g., cough, fever, others) AND [1] no complications or SOB  Answer Assessment - Initial Assessment Questions 1. COVID-19 DIAGNOSIS: "Who made your COVID-19 diagnosis?" "Was it confirmed by a positive lab test or self-test?" If not diagnosed by a doctor (or NP/PA), ask "Are there lots of cases (community spread) where you live?" Note: See public health department website, if unsure.     [facility], resulted today 2. COVID-19 EXPOSURE: "Was there any known exposure to COVID before the symptoms began?" CDC Definition of close contact: within 6 feet (2 meters) for a total of 15 minutes or more over a 24-hour period.      husband 3. ONSET: "When did the COVID-19 symptoms start?"     Tuesday 4. WORST SYMPTOM: "What is your worst symptom?" (e.g., cough, fever, shortness of breath, muscle aches)     Body aches 5. COUGH: "Do you have a cough?" If Yes, ask: "How bad is the cough?"       Yes,  6. FEVER: "Do you have a fever?" If Yes, ask: "What is your temperature, how was it measured, and when did it start?"     Subjective "LGT" 7. RESPIRATORY STATUS: "Describe your breathing?" (e.g., shortness of breath, wheezing, unable to speak)      No SOB, just congested 8. BETTER-SAME-WORSE: "Are you getting better, staying the same or getting worse compared to yesterday?"  If getting worse, ask, "In what way?"     worse 9. HIGH RISK DISEASE: "Do you have any chronic medical problems?" (e.g., asthma, heart or lung disease, weak immune system, obesity, etc.)      10. VACCINE: "Have you had the COVID-19 vaccine?" If Yes, ask: "Which one, how many shots, when did you get it?"       no 11. BOOSTER: "Have you received your COVID-19 booster?" If Yes, ask: "Which one and when did you get it?"       no 13. OTHER SYMPTOMS: "Do you have any other symptoms?"  (e.g., chills, fatigue,  headache, loss of smell or taste, muscle pain, sore throat)       Body aches, congestion, fatigue, headache, sore throat. 14. O2 SATURATION MONITOR:  "Do you use an oxygen saturation monitor (pulse oximeter) at home?" If Yes, ask "What is your reading (oxygen level) today?" "What is your usual oxygen saturation reading?" (e.g., 95%)       N/A  Protocols used: Coronavirus (COVID-19) Diagnosed or Suspected-A-AH

## 2020-11-05 ENCOUNTER — Ambulatory Visit: Payer: Self-pay | Admitting: *Deleted

## 2020-11-05 ENCOUNTER — Encounter: Payer: Self-pay | Admitting: Internal Medicine

## 2020-11-05 ENCOUNTER — Ambulatory Visit (INDEPENDENT_AMBULATORY_CARE_PROVIDER_SITE_OTHER): Payer: Medicare HMO | Admitting: Internal Medicine

## 2020-11-05 VITALS — BP 116/80 | HR 69 | Temp 97.7°F | Ht 64.0 in | Wt 157.0 lb

## 2020-11-05 DIAGNOSIS — J0101 Acute recurrent maxillary sinusitis: Secondary | ICD-10-CM

## 2020-11-05 MED ORDER — CEFDINIR 300 MG PO CAPS: 300.0000 mg | ORAL_CAPSULE | Freq: Two times a day (BID) | ORAL | 0 refills | Status: AC

## 2020-11-05 NOTE — Progress Notes (Signed)
Date:  11/05/2020   Name:  Natalie [name]   DOB:  [birth year]   MRN:  [id number]   Chief Complaint: Dizziness (X1 days,From sinus congestion, off balance, felt like the room was spinning yesterday )  Sinusitis This is a new problem. The current episode started yesterday. The problem has been gradually worsening since onset. There has been no fever. Associated symptoms include congestion, coughing and sinus pressure. Pertinent negatives include no chills or shortness of breath. (Recently recovering from Covid-19.)  She was feeling better since covid - still a bit of chest congestion but no more body aches or fatigue.  She cut the grass on Saturday and yesterday woke up with vertigo and sinus pressures/drainage.  Lab Results  Component Value Date   CREATININE 0.92 06/25/2020   BUN 15 06/25/2020   NA 141 06/25/2020   K 4.7 06/25/2020   CL 102 06/25/2020   CO2 22 06/25/2020   Lab Results  Component Value Date   CHOL 212 (H) 06/25/2020   HDL 70 06/25/2020   LDLCALC 118 (H) 06/25/2020   TRIG 136 06/25/2020   CHOLHDL 3.0 06/25/2020   Lab Results  Component Value Date   TSH 3.570 06/25/2020   Lab Results  Component Value Date   HGBA1C 5.5 11/27/2017   Lab Results  Component Value Date   WBC 7.4 06/25/2020   HGB 14.4 06/25/2020   HCT 41.9 06/25/2020   MCV 96 06/25/2020   PLT 278 06/25/2020   Lab Results  Component Value Date   ALT 15 06/25/2020   AST 22 06/25/2020   ALKPHOS 101 06/25/2020   BILITOT 0.4 06/25/2020     Review of Systems  Constitutional:  Negative for chills, fatigue and fever.  HENT:  Positive for congestion, sinus pressure and tinnitus (intermittently). Negative for trouble swallowing.   Respiratory:  Positive for cough. Negative for chest tightness, shortness of breath and wheezing.   Neurological:  Positive for dizziness (yesterday but better today).   Patient Active Problem List   Diagnosis Date Noted  . Dizziness, nonspecific 07/31/2020  .  Periodic limb movement sleep disorder 03/14/2020  . Osteopenia determined by x-ray 09/22/2019  . Elevated LDL cholesterol level 07/18/2019  . Vestibular migraine 11/27/2017  . Hx of diverticulitis of colon   . Benign neoplasm of ascending colon   . History of colostomy reversal     Allergies  Allergen Reactions  . Codeine Nausea Only  . Doxycycline Nausea And Vomiting  . Levofloxacin Other (See Comments)    insomnia  . Effexor [Venlafaxine] Nausea Only    And dizziness  . Sulfa Antibiotics Rash  . Topamax [Topiramate] Other (See Comments)    sedation    Past Surgical History:  Procedure Laterality Date  . BREAST BIOPSY Right 03/15/2019   stereo biopsy/ x clip/ benign  . COLONOSCOPY WITH PROPOFOL N/A 07/27/2015   Procedure: COLONOSCOPY WITH PROPOFOL;  Surgeon: Lucilla Lame, MD;  Location: [place];  Service: Endoscopy;  Laterality: N/A;  . COLONOSCOPY WITH PROPOFOL N/A 03/14/2016   Procedure: COLONOSCOPY WITH PROPOFOL;  Surgeon: Lucilla Lame, MD;  Location: [place];  Service: Endoscopy;  Laterality: N/A;  Cannot arrive before 0830 AM  . COLOSTOMY REVERSAL N/A 09/26/2015   Procedure: COLOSTOMY REVERSAL / TAKE DOWN;  Surgeon: Hubbard Robinson, MD;  Location: [facility];  Service: General;  Laterality: N/A;  . LAPAROTOMY N/A 03/01/2015   Procedure: EXPLORATORY LAPAROTOMY, SIGMOID COLECTOMY, COLOSTOMY;  Surgeon: Hubbard Robinson, MD;  Location: [facility];  Service: General;  Laterality: N/A;  . POLYPECTOMY  03/14/2016   Procedure: POLYPECTOMY;  Surgeon: Lucilla Lame, MD;  Location: [place];  Service: Endoscopy;;    Social History   Tobacco Use  . Smoking status: Never  . Smokeless tobacco: Never  Vaping Use  . Vaping Use: Never used  Substance Use Topics  . Alcohol use: No    Alcohol/week: 0.0 standard drinks  . Drug use: No     Medication list has been reviewed and updated.  Current Meds  Medication Sig  . cholecalciferol (VITAMIN D)  1000 units tablet Take 1,000 Units by mouth daily.  . cyanocobalamin 1000 MCG tablet Take 1,000 mcg by mouth daily.  Marland Kitchen gabapentin (NEURONTIN) 100 MG capsule Take 2 capsules (200 mg total) by mouth at bedtime. (Patient taking differently: Take 200 mg by mouth as needed.)  . Meclizine HCl 25 MG CHEW Chew by mouth.  . melatonin 3 MG TABS tablet Take 3 mg by mouth at bedtime.  . mometasone (NASONEX) 50 MCG/ACT nasal spray Place 2 sprays into the nose daily.  . Multiple Vitamin (MULTIVITAMIN) tablet Take 1 tablet by mouth daily.  . polyethylene glycol (MIRALAX / GLYCOLAX) packet Take 17 g by mouth daily.  . vitamin C (ASCORBIC ACID) 500 MG tablet Take 500 mg by mouth daily.    PHQ 2/9 Scores 11/05/2020 07/31/2020 06/25/2020 05/16/2020  PHQ - 2 Score 0 0 0 0  PHQ- 9 Score 0 0 0 -    GAD 7 : Generalized Anxiety Score 11/05/2020 07/31/2020 06/25/2020 03/14/2020  Nervous, Anxious, on Edge 0 0 0 0  Control/stop worrying 0 0 0 0  Worry too much - different things 0 0 0 0  Trouble relaxing 0 0 0 0  Restless 0 0 0 0  Easily annoyed or irritable 0 0 0 0  Afraid - awful might happen 0 0 0 0  Total GAD 7 Score 0 0 0 0  Anxiety Difficulty - - - Not difficult at all    BP Readings from Last 3 Encounters:  11/05/20 116/80  07/31/20 118/82  06/25/20 132/82    Physical Exam Constitutional:      Appearance: Normal appearance. She is well-developed.  HENT:     Right Ear: Ear canal and external ear normal. Tympanic membrane is not erythematous or retracted.     Left Ear: Ear canal and external ear normal. Tympanic membrane is not erythematous or retracted.     Nose:     Right Sinus: Maxillary sinus tenderness present. No frontal sinus tenderness.     Left Sinus: Maxillary sinus tenderness present. No frontal sinus tenderness.     Mouth/Throat:     Mouth: No oral lesions.  Cardiovascular:     Rate and Rhythm: Normal rate and regular rhythm.     Pulses: Normal pulses.     Heart sounds: Normal heart  sounds.  Pulmonary:     Effort: Pulmonary effort is normal.     Breath sounds: Normal breath sounds. No wheezing, rhonchi or rales.  Musculoskeletal:     Cervical back: Normal range of motion.  Lymphadenopathy:     Cervical: No cervical adenopathy.  Neurological:     Mental Status: She is alert and oriented to person, place, and time.    Wt Readings from Last 3 Encounters:  11/05/20 157 lb (71.2 kg)  07/31/20 162 lb (73.5 kg)  06/25/20 164 lb (74.4 kg)    BP 116/80  Pulse 69   Temp 97.7 F (36.5 C) (Oral)   Ht '5\' 4"'$  (1.626 m)   Wt 157 lb (71.2 kg)   SpO2 98%   BMI 26.95 kg/m   Assessment and Plan: 1. Acute recurrent maxillary sinusitis Continue Mucinex, rest, fluids She likely over-exerted on the weekend before she was really completely recovered from Covid. - cefdinir (OMNICEF) 300 MG capsule; Take 1 capsule (300 mg total) by mouth 2 (two) times daily for 10 days.  Dispense: 20 capsule; Refill: 0   Partially dictated using Editor, commissioning. Any errors are unintentional.  Halina Maidens, MD [facility]  11/05/2020

## 2020-11-05 NOTE — Telephone Encounter (Signed)
I returned pt's call.    She tested positive for Covid on 10/23/2020.  She is feeling better from that.   She is c/o sinus congestion, headache, left ear fullness and post nasal drip along with vertigo.   She woke up with vertigo yesterday morning at 5:00.  Nancy Fetter).   "I get dizzy when I have sinus infections".   "Dr. Army Melia is very familiar with how I get".   "She also knows I'm getting over Covid because I called and let her know when I tested positive".  See triage notes.  It's been over 10 days since she initially tested positive 10/23/2020.   I scheduled her an in office visit for today at 1:20 with Dr. Army Melia. She has not had fever for over 24 hrs.  I sent my notes to [facility] for Dr. Halina Maidens.

## 2020-11-05 NOTE — Telephone Encounter (Signed)
Reason for Disposition  [1] MODERATE dizziness (e.g., vertigo; feels very unsteady, interferes with normal activities) AND [2] has NOT been evaluated by physician for this  Answer Assessment - Initial Assessment Questions 1. DESCRIPTION: "Describe your dizziness."     I'm having vertigo.   It's associated with sinus infections.   I'm positive for Covid 10/23/2020 with PCR.    My home test is positive this morning.    I'm still having sinus congestion and chest congestion.   It's yellow thick mucus I'm coughing up.  It's started over the last 5 days.   I'm having post nasal drip.    2. VERTIGO: "Do you feel like either you or the room is spinning or tilting?"     The vertigo was there Sun. At 5:00 AM.   Things are spinning around.   Yesterday I stayed in bed most of the day.   Today I'm up and around and things are spinning around.   When I lay down the dizziness goes away.    I'm using a walker to get around for safety.   I use Meclizine yesterday morning.  I've not taken any today.    It makes me so sleepy so I don't like to take it.   If I use 1/2 a tablet it's not as bad. 3. LIGHTHEADED: "Do you feel lightheaded?" (e.g., somewhat faint, woozy, weak upon standing)     No 4. SEVERITY: "How bad is it?"  "Can you walk?"   - MILD: Feels slightly dizzy and unsteady, but is walking normally.   - MODERATE: Feels unsteady when walking, but not falling; interferes with normal activities (e.g., school, work).   - SEVERE: Unable to walk without falling, or requires assistance to walk without falling.     Mild 5. ONSET:  "When did the dizziness begin?"     Yesterday morning at 5:00 6. AGGRAVATING FACTORS: "Does anything make it worse?" (e.g., standing, change in head position)     Moving around 7. CAUSE: "What do you think is causing the dizziness?"     I have a history of vertigo that goes along with having sinus infections.   I'm getting over Covid and have congestion left over. 8. RECURRENT SYMPTOM:  "Have you had dizziness before?" If Yes, ask: "When was the last time?" "What happened that time?"     Yes vertigo   The congestion is worsened.   9. OTHER SYMPTOMS: "Do you have any other symptoms?" (e.g., headache, weakness, numbness, vomiting, earache)     Headaches, no sore throat now, congestion in the back of my throat, and coughing.   My left ear feels full. 10. PREGNANCY: "Is there any chance you are pregnant?" "When was your last menstrual period?"       N/A due to age  Protocols used: Dizziness - Vertigo-A-AH

## 2020-11-21 ENCOUNTER — Encounter (HOSPITAL_COMMUNITY): Payer: Self-pay | Admitting: Emergency Medicine

## 2020-11-21 ENCOUNTER — Emergency Department (HOSPITAL_COMMUNITY): Payer: Medicare HMO

## 2020-11-21 ENCOUNTER — Emergency Department (HOSPITAL_COMMUNITY)
Admission: EM | Admit: 2020-11-21 | Discharge: 2020-11-22 | Disposition: A | Discharge status: Home / Self Care | Payer: Medicare HMO | Attending: Emergency Medicine | Admitting: Emergency Medicine

## 2020-11-21 DIAGNOSIS — R202 Paresthesia of skin: Secondary | ICD-10-CM | POA: Insufficient documentation

## 2020-11-21 DIAGNOSIS — R200 Anesthesia of skin: Secondary | ICD-10-CM | POA: Diagnosis not present

## 2020-11-21 DIAGNOSIS — R69 Illness, unspecified: Secondary | ICD-10-CM | POA: Diagnosis not present

## 2020-11-21 DIAGNOSIS — Y900 Blood alcohol level of less than 20 mg/100 ml: Secondary | ICD-10-CM | POA: Diagnosis not present

## 2020-11-21 DIAGNOSIS — Z79899 Other long term (current) drug therapy: Secondary | ICD-10-CM | POA: Insufficient documentation

## 2020-11-21 DIAGNOSIS — G43809 Other migraine, not intractable, without status migrainosus: Secondary | ICD-10-CM | POA: Insufficient documentation

## 2020-11-21 DIAGNOSIS — R791 Abnormal coagulation profile: Secondary | ICD-10-CM | POA: Diagnosis not present

## 2020-11-21 DIAGNOSIS — R42 Dizziness and giddiness: Secondary | ICD-10-CM | POA: Diagnosis not present

## 2020-11-21 LAB — DIFFERENTIAL
Abs Immature Granulocytes: 0.02 10*3/uL (ref 0.00–0.07)
Basophils Absolute: 0.1 10*3/uL (ref 0.0–0.1)
Basophils Relative: 1 %
Eosinophils Absolute: 0.5 10*3/uL (ref 0.0–0.5)
Eosinophils Relative: 7 %
Immature Granulocytes: 0 %
Lymphocytes Relative: 29 %
Lymphs Abs: 2.4 10*3/uL (ref 0.7–4.0)
Monocytes Absolute: 0.9 10*3/uL (ref 0.1–1.0)
Monocytes Relative: 11 %
Neutro Abs: 4.4 10*3/uL (ref 1.7–7.7)
Neutrophils Relative %: 52 %

## 2020-11-21 LAB — COMPREHENSIVE METABOLIC PANEL
ALT: 19 U/L (ref 0–44)
AST: 22 U/L (ref 15–41)
Albumin: 4 g/dL (ref 3.5–5.0)
Alkaline Phosphatase: 85 U/L (ref 38–126)
Anion gap: 7 (ref 5–15)
BUN: 15 mg/dL (ref 8–23)
CO2: 26 mmol/L (ref 22–32)
Calcium: 9.5 mg/dL (ref 8.9–10.3)
Chloride: 107 mmol/L (ref 98–111)
Creatinine, Ser: 0.85 mg/dL (ref 0.44–1.00)
GFR, Estimated: >60 mL/min (ref >60)
Glucose, Bld: 99 mg/dL (ref 70–99)
Potassium: 4.1 mmol/L (ref 3.5–5.1)
Sodium: 140 mmol/L (ref 135–145)
Total Bilirubin: 0.5 mg/dL (ref 0.3–1.2)
Total Protein: 7.1 g/dL (ref 6.5–8.1)

## 2020-11-21 LAB — I-STAT CHEM 8, ED
BUN: 17 mg/dL (ref 8–23)
Calcium, Ion: 1.19 mmol/L (ref 1.15–1.40)
Chloride: 106 mmol/L (ref 98–111)
Creatinine, Ser: 0.8 mg/dL (ref 0.44–1.00)
Glucose, Bld: 98 mg/dL (ref 70–99)
HCT: 41 % (ref 36.0–46.0)
Hemoglobin: 13.9 g/dL (ref 12.0–15.0)
Potassium: 4.1 mmol/L (ref 3.5–5.1)
Sodium: 142 mmol/L (ref 135–145)
TCO2: 28 mmol/L (ref 22–32)

## 2020-11-21 LAB — CBC
HCT: 42.3 % (ref 36.0–46.0)
Hemoglobin: 14.1 g/dL (ref 12.0–15.0)
MCH: 32.3 pg (ref 26.0–34.0)
MCHC: 33.3 g/dL (ref 30.0–36.0)
MCV: 97 fL (ref 80.0–100.0)
Platelets: 263 10*3/uL (ref 150–400)
RBC: 4.36 MIL/uL (ref 3.87–5.11)
RDW: 12.6 % (ref 11.5–15.5)
WBC: 8.3 10*3/uL (ref 4.0–10.5)
nRBC: 0 % (ref 0.0–0.2)

## 2020-11-21 LAB — ETHANOL: Alcohol, Ethyl (B): <10 mg/dL (ref <10)

## 2020-11-21 LAB — PROTIME-INR
INR: 0.9 (ref 0.8–1.2)
Prothrombin Time: 12.6 seconds (ref 11.4–15.2)

## 2020-11-21 LAB — APTT: aPTT: 33 seconds (ref 24–36)

## 2020-11-21 MED ORDER — DIAZEPAM 5 MG/ML IJ SOLN
5.0000 mg | INTRAMUSCULAR | Status: DC | PRN
  Administered 2020-11-21: 5 mg via INTRAVENOUS
  Filled 2020-11-21: qty 2

## 2020-11-21 NOTE — ED Provider Notes (Signed)
[place] EMERGENCY DEPARTMENT Provider Note   CSN: [id number] Arrival date & time: 11/21/20  1728     History Chief Complaint  Patient presents with   Stroke Symptoms    Natalie [name] is a 65 y.o. female.  HPI She presents for evaluation of a sensation of balance problems beginning at 3 PM today with numbness and tingling in the left side of her face.  She states at the time of onset of the disequilibrium, she was using a roller, painting on a wall.  She felt dizzy and had to sit down.  A few minutes later she noticed some tingling in her left face, left arm and left leg.  She also noticed trouble walking that felt like her right leg was not working well.  She has had an intermittent posterior headache since that time.  The symptoms have mostly resolved.  She also had some dizziness when she was moving from the stretcher to the CT table.  She has a history of "vertiginous migraines."  She has chronic intermittent episodes of dizziness, not requiring treatment but has been troubled by it since she had an episode of diverticulitis many years ago.  She denies current fever, chills, nausea, vomiting, shortness of breath, cough or chest pain.  She was treated for sinus infection with cefdinir, about 3 weeks ago.  She does not feel she has ongoing sinus symptoms.  There are no other known active modifying factors    Past Medical History:  Diagnosis Date   Benign neoplasm of sigmoid colon    Chronic constipation    Chronic sinusitis    Cystitis    Diverticulitis    Perforated bowel ([place])    Pleural effusion on right 2016   Preventative health care 05/24/2015    Patient Active Problem List   Diagnosis Date Noted   Dizziness, nonspecific 07/31/2020   Periodic limb movement sleep disorder 03/14/2020   Osteopenia determined by x-ray 09/22/2019   Elevated LDL cholesterol level 07/18/2019   Vestibular migraine 11/27/2017   Hx of diverticulitis of colon    Benign  neoplasm of ascending colon    History of colostomy reversal     Past Surgical History:  Procedure Laterality Date   BREAST BIOPSY Right 03/15/2019   stereo biopsy/ x clip/ benign   COLONOSCOPY WITH PROPOFOL N/A 07/27/2015   Procedure: COLONOSCOPY WITH PROPOFOL;  Surgeon: Lucilla Lame, MD;  Location: [place];  Service: Endoscopy;  Laterality: N/A;   COLONOSCOPY WITH PROPOFOL N/A 03/14/2016   Procedure: COLONOSCOPY WITH PROPOFOL;  Surgeon: Lucilla Lame, MD;  Location: [place];  Service: Endoscopy;  Laterality: N/A;  Cannot arrive before 0830 AM   COLOSTOMY REVERSAL N/A 09/26/2015   Procedure: COLOSTOMY REVERSAL / TAKE DOWN;  Surgeon: Hubbard Robinson, MD;  Location: [facility];  Service: General;  Laterality: N/A;   LAPAROTOMY N/A 03/01/2015   Procedure: EXPLORATORY LAPAROTOMY, SIGMOID COLECTOMY, COLOSTOMY;  Surgeon: Hubbard Robinson, MD;  Location: [facility];  Service: General;  Laterality: N/A;   POLYPECTOMY  03/14/2016   Procedure: POLYPECTOMY;  Surgeon: Lucilla Lame, MD;  Location: [place];  Service: Endoscopy;;     OB History   No obstetric history on file.     Family History  Problem Relation Age of Onset   Stroke Mother    Arthritis Father    Lung cancer Father    Colon cancer Maternal Aunt    Breast cancer Paternal Aunt    Heart  attack Brother     Social History   Tobacco Use   Smoking status: Never   Smokeless tobacco: Never  Vaping Use   Vaping Use: Never used  Substance Use Topics   Alcohol use: No    Alcohol/week: 0.0 standard drinks   Drug use: No    Home Medications Prior to Admission medications   Medication Sig Start Date End Date Taking? Authorizing Provider  cholecalciferol (VITAMIN D) 1000 units tablet Take 1,000 Units by mouth daily.    [provider]  cyanocobalamin 1000 MCG tablet Take 1,000 mcg by mouth daily.    [provider]  gabapentin (NEURONTIN) 100 MG capsule Take 2 capsules (200 mg  total) by mouth at bedtime. Patient taking differently: Take 200 mg by mouth as needed. 07/31/20   Glean Hess, MD  Meclizine HCl 25 MG CHEW Chew by mouth.    [provider]  melatonin 3 MG TABS tablet Take 3 mg by mouth at bedtime.    [provider]  mometasone (NASONEX) 50 MCG/ACT nasal spray Place 2 sprays into the nose daily. 02/14/20   Glean Hess, MD  Multiple Vitamin (MULTIVITAMIN) tablet Take 1 tablet by mouth daily.    [provider]  polyethylene glycol (MIRALAX / GLYCOLAX) packet Take 17 g by mouth daily.    [provider]  vitamin C (ASCORBIC ACID) 500 MG tablet Take 500 mg by mouth daily.    [provider]    Allergies    Codeine, Doxycycline, Levofloxacin, Effexor [venlafaxine], Sulfa antibiotics, and Topamax [topiramate]  Review of Systems   Review of Systems  All other systems reviewed and are negative.  Physical Exam Updated Vital Signs BP (!) 99/59   Pulse 79   Temp 97.8 F (36.6 C) (Oral)   Resp 20   SpO2 96%   Physical Exam Vitals and nursing note reviewed.  Constitutional:      General: She is not in acute distress.    Appearance: She is well-developed. She is not ill-appearing or toxic-appearing.  HENT:     Head: Normocephalic and atraumatic.     Right Ear: External ear normal.     Left Ear: External ear normal.  Eyes:     Conjunctiva/sclera: Conjunctivae normal.     Pupils: Pupils are equal, round, and reactive to light.  Neck:     Trachea: Phonation normal.  Cardiovascular:     Rate and Rhythm: Normal rate and regular rhythm.     Heart sounds: Normal heart sounds.  Pulmonary:     Effort: Pulmonary effort is normal.     Breath sounds: Normal breath sounds.  Abdominal:     Palpations: Abdomen is soft.     Tenderness: There is no abdominal tenderness.  Musculoskeletal:        General: Normal range of motion.     Cervical back: Normal range of motion and neck supple.  Skin:    General:  Skin is warm and dry.  Neurological:     Mental Status: She is alert and oriented to person, place, and time.     Cranial Nerves: No cranial nerve deficit.     Sensory: No sensory deficit.     Motor: No abnormal muscle tone.     Coordination: Coordination normal.     Comments: No dysarthria, aphasia, nystagmus, pronator drift, ataxia.  Psychiatric:        Mood and Affect: Mood normal.        Behavior: Behavior  normal.        Thought Content: Thought content normal.        Judgment: Judgment normal.    ED Results / Procedures / Treatments   Labs (all labs ordered are listed, but only abnormal results are displayed) Labs Reviewed  ETHANOL  PROTIME-INR  APTT  CBC  DIFFERENTIAL  COMPREHENSIVE METABOLIC PANEL  I-STAT CHEM 8, ED    EKG EKG Interpretation  Date/Time:  Wednesday November 21 2020 17:36:14 EDT Ventricular Rate:  77 PR Interval:  138 QRS Duration: 78 QT Interval:  386 QTC Calculation: 436 R Axis:   69 Text Interpretation: Normal sinus rhythm Normal ECG since last tracing no significant change Confirmed by Daleen Bo [phone]) on 11/21/2020 6:45:30 PM  Radiology CT HEAD WO CONTRAST  Result Date: 11/21/2020 CLINICAL DATA:  Transient ischemic attack (TIA). CT Head Patient reports change in equilibrium around 3pm with numbness and tingling along left side of face. No slurred speech, no droop, no drift, VAN negative. EXAM: CT HEAD WITHOUT CONTRAST TECHNIQUE: Contiguous axial images were obtained from the base of the skull through the vertex without intravenous contrast. COMPARISON:  CT head 03/10/2018. FINDINGS: Brain: Cerebral ventricle sizes are concordant with the degree of cerebral volume loss. No evidence of large-territorial acute infarction. No parenchymal hemorrhage. No mass lesion. No extra-axial collection. No mass effect or midline shift. No hydrocephalus. Basilar cisterns are patent. Vascular: No hyperdense vessel. Skull: No acute fracture or focal lesion.  Sinuses/Orbits: Paranasal sinuses and mastoid air cells are clear. The orbits are unremarkable. Other: None. IMPRESSION: No acute intracranial abnormality. Electronically Signed   By: Iven Finn M.D.   On: 11/21/2020 19:22   MR BRAIN WO CONTRAST  Result Date: 11/21/2020 CLINICAL DATA:  Dizziness and left facial tingling EXAM: MRI HEAD WITHOUT CONTRAST TECHNIQUE: Multiplanar, multiecho pulse sequences of the brain and surrounding structures were obtained without intravenous contrast. COMPARISON:  01/02/2017 FINDINGS: Brain: No acute infarct, mass effect or extra-axial collection. No acute or chronic hemorrhage. Normal white matter signal, parenchymal volume and CSF spaces. The midline structures are normal. Vascular: Major flow voids are preserved. Skull and upper cervical spine: Normal calvarium and skull base. Visualized upper cervical spine and soft tissues are normal. Sinuses/Orbits:No paranasal sinus fluid levels or advanced mucosal thickening. No mastoid or middle ear effusion. Normal orbits. IMPRESSION: Normal brain MRI. Electronically Signed   By: Ulyses Jarred M.D.   On: 11/21/2020 23:31    Procedures Procedures   Medications Ordered in ED Medications - No data to display  ED Course  I have reviewed the triage vital signs and the nursing notes.  Pertinent labs & imaging results that were available during my care of the patient were reviewed by me and considered in my medical decision making (see chart for details).    MDM Rules/Calculators/A&P                            No data found.   11:44 PM Reevaluation with update and discussion. After initial assessment and treatment, an updated evaluation reveals no change in clinical status, findings cussed with the patient and her husband, all questions were answered. Daleen Bo   Medical Decision Making:  This patient is presenting for evaluation of dizziness with paresthesia, which does require a range of treatment options,  and is a complaint that involves a high risk of morbidity and mortality. The differential diagnoses include TIA, CVA, sinusitis, peripheral vertigo. I decided  to review old records, and in summary Eh[place] female, with history of complex migraines, presenting for evaluation of dizziness, paresthesias on the left and difficulty moving her right leg.  Symptoms mostly resolved on arrival to the ED.  I did not require additional historical information from anyone.  Clinical Laboratory Tests Ordered, included CBC. Review indicates normal findings. Radiologic Tests Ordered, included CT head, MRI brain.  I independently Visualized: Radiographic images, which show no acute abnormalities no acute abnormalities    Critical Interventions-clinical evaluation, laboratory testing, CT imaging, MRI imaging, observation and reassessment  After These Interventions, the Patient was reevaluated and was found persistent symptoms, nonspecific, possibly related to recent sinus infection.  Patient also has history of migraines.  This may be a complex migraine.  There is no evidence for TIA, stroke, or serious disability at this time.  Patient stable for discharge with outpatient management by her neurologist.  CRITICAL CARE-no Performed by: Daleen Bo  Nursing Notes Reviewed/ Care Coordinated Applicable Imaging Reviewed Interpretation of Laboratory Data incorporated into ED treatment  The patient appears reasonably screened and/or stabilized for discharge and I doubt any other medical condition or other [facility] requiring further screening, evaluation, or treatment in the ED at this time prior to discharge.  Plan: Home Medications-continue usual; Home Treatments-regular activity; return here if the recommended treatment, does not improve the symptoms; Recommended follow up-neurology follow-up for further care and treatment within the next few weeks.     Final Clinical Impression(s) / ED Diagnoses Final diagnoses:   Other migraine without status migrainosus, not intractable    Rx / DC Orders ED Discharge Orders     None        Daleen Bo, MD 11/23/20 1357

## 2020-11-21 NOTE — ED Notes (Signed)
Pt to MRI

## 2020-11-21 NOTE — Discharge Instructions (Addendum)
The MRI is negative.  There is no sign of stroke.  Most likely this is a complex migraine, and can be followed and managed by your neurologist or primary care doctor.  Make sure you are getting plenty of rest, drink a lot of fluids and take your regular medications.

## 2020-11-21 NOTE — ED Provider Notes (Signed)
Emergency Medicine Provider Triage Evaluation Note  Natalie [name] , a 65 y.o. female  was evaluated in triage.  Pt complains of dizziness and tingling along the left side of her face that occurred at 3 PM.  States that she was painting when symptoms began.  Over the past several hours her symptoms have gradually resolved.  She has dizziness at baseline related to her vertigo but at the time her symptoms felt different.  She reports generalized weakness but no focal weakness or numbness.  No facial asymmetry or changes to her speech.  No injury or fall.  Review of Systems  Positive: Generalized weakness Negative: Extremity weakness, headache, vision changes  Physical Exam  BP (!) 174/77 (BP Location: Left Arm)   Pulse 75   Temp 98.2 F (36.8 C) (Oral)   Resp 16   SpO2 98%  Gen:   Awake, no distress   Resp:  Normal effort  MSK:   Moves extremities without difficulty  Other:  EOMs are intact.  No visual field deficits.  Normal gait.  Normal finger-to-nose coordination bilaterally.  No facial asymmetry.  Moving all extremities out difficulty.  Strength 5/5 in bilateral upper and lower extremities.  Normal sensation to light touch of bilateral upper and lower extremities, face  Medical Decision Making  Medically screening exam initiated at 5:43 PM.  Appropriate orders placed.  Dondra Prader was informed that the remainder of the evaluation will be completed by another provider, this initial triage assessment does not replace that evaluation, and the importance of remaining in the ED until their evaluation is complete.  Van negative, no weakness on exam but stroke w/u ordered   Delia Heady, PA-C 11/21/20 1748    Daleen Bo, MD 11/23/20 1334

## 2020-11-21 NOTE — ED Triage Notes (Signed)
Pt reports change in equilibrium around 3pm with numbness and tingling along left side of face. No slurred speech, no droop, no drift, VAN negative.

## 2020-11-30 ENCOUNTER — Other Ambulatory Visit: Payer: Self-pay | Admitting: Internal Medicine

## 2020-11-30 DIAGNOSIS — Z1231 Encounter for screening mammogram for malignant neoplasm of breast: Secondary | ICD-10-CM

## 2020-12-06 DIAGNOSIS — R69 Illness, unspecified: Secondary | ICD-10-CM | POA: Diagnosis not present

## 2020-12-06 DIAGNOSIS — Z833 Family history of diabetes mellitus: Secondary | ICD-10-CM | POA: Diagnosis not present

## 2020-12-06 DIAGNOSIS — Z823 Family history of stroke: Secondary | ICD-10-CM | POA: Diagnosis not present

## 2020-12-06 DIAGNOSIS — R32 Unspecified urinary incontinence: Secondary | ICD-10-CM | POA: Diagnosis not present

## 2020-12-06 DIAGNOSIS — Z882 Allergy status to sulfonamides status: Secondary | ICD-10-CM | POA: Diagnosis not present

## 2020-12-06 DIAGNOSIS — Z8249 Family history of ischemic heart disease and other diseases of the circulatory system: Secondary | ICD-10-CM | POA: Diagnosis not present

## 2020-12-06 DIAGNOSIS — Z809 Family history of malignant neoplasm, unspecified: Secondary | ICD-10-CM | POA: Diagnosis not present

## 2020-12-11 ENCOUNTER — Ambulatory Visit (INDEPENDENT_AMBULATORY_CARE_PROVIDER_SITE_OTHER): Payer: Medicare HMO | Admitting: Internal Medicine

## 2020-12-11 ENCOUNTER — Encounter: Payer: Self-pay | Admitting: Internal Medicine

## 2020-12-11 ENCOUNTER — Other Ambulatory Visit: Payer: Self-pay

## 2020-12-11 VITALS — BP 124/80 | HR 78 | Temp 98.0°F | Ht 64.0 in | Wt 162.0 lb

## 2020-12-11 DIAGNOSIS — G43809 Other migraine, not intractable, without status migrainosus: Secondary | ICD-10-CM | POA: Diagnosis not present

## 2020-12-11 DIAGNOSIS — R42 Dizziness and giddiness: Secondary | ICD-10-CM | POA: Diagnosis not present

## 2020-12-11 DIAGNOSIS — G4761 Periodic limb movement disorder: Secondary | ICD-10-CM | POA: Diagnosis not present

## 2020-12-11 MED ORDER — DIAZEPAM 2 MG PO TABS: 2.0000 mg | ORAL_TABLET | Freq: Four times a day (QID) | ORAL | 0 refills | Status: DC | PRN

## 2020-12-11 NOTE — Progress Notes (Signed)
Date:  12/11/2020   Name:  Natalie [name]   DOB:  08-29-1955   MRN:  [id number]   Chief Complaint: Insomnia (Had sleep study done in January, pt has been sleeping ok, did not need cpap) and Dizziness (Not a new problem, Still feels dizzy but has gotten better )  Insomnia Primary symptoms: no sleep disturbance.   The problem occurs intermittently. The problem has been resolved since onset. Prior diagnostic workup includes:  Polysomnogram (no sleep apnea).  Dizziness This is a recurrent problem. Pertinent negatives include no abdominal pain, chest pain, coughing, fatigue, headaches, visual change or weakness. Treatments tried: meclizine - would like to try low dose valium.  She did have an episode several weeks ago of vertigo along with focal tingling.  CT and MRI of head were normal.  Sx resolved after several hours.  She has hx of Vertiginous migraines so she resumed daily magnesium. Lab Results  Component Value Date   CREATININE 0.80 11/21/2020   BUN 17 11/21/2020   NA 142 11/21/2020   K 4.1 11/21/2020   CL 106 11/21/2020   CO2 26 11/21/2020   Lab Results  Component Value Date   CHOL 212 (H) 06/25/2020   HDL 70 06/25/2020   LDLCALC 118 (H) 06/25/2020   TRIG 136 06/25/2020   CHOLHDL 3.0 06/25/2020   Lab Results  Component Value Date   TSH 3.570 06/25/2020   Lab Results  Component Value Date   HGBA1C 5.5 11/27/2017   Lab Results  Component Value Date   WBC 8.3 11/21/2020   HGB 13.9 11/21/2020   HCT 41.0 11/21/2020   MCV 97.0 11/21/2020   PLT 263 11/21/2020   Lab Results  Component Value Date   ALT 19 11/21/2020   AST 22 11/21/2020   ALKPHOS 85 11/21/2020   BILITOT 0.5 11/21/2020     Review of Systems  Constitutional:  Negative for fatigue and unexpected weight change.  HENT:  Negative for nosebleeds.   Eyes:  Negative for visual disturbance.  Respiratory:  Negative for cough, chest tightness, shortness of breath and wheezing.   Cardiovascular:  Negative  for chest pain, palpitations and leg swelling.  Gastrointestinal:  Negative for abdominal pain, constipation and diarrhea.  Neurological:  Positive for dizziness. Negative for weakness, light-headedness and headaches.  Psychiatric/Behavioral:  Negative for sleep disturbance. The patient has insomnia. The patient is not nervous/anxious.    Patient Active Problem List   Diagnosis Date Noted   Dizziness, nonspecific 07/31/2020   Periodic limb movement sleep disorder 03/14/2020   Osteopenia determined by x-ray 09/22/2019   Elevated LDL cholesterol level 07/18/2019   Vestibular migraine 11/27/2017   Hx of diverticulitis of colon    Benign neoplasm of ascending colon    History of colostomy reversal     Allergies  Allergen Reactions   Codeine Nausea Only   Doxycycline Nausea And Vomiting   Levofloxacin Other (See Comments)    insomnia   Effexor [Venlafaxine] Nausea Only    And dizziness   Sulfa Antibiotics Rash   Topamax [Topiramate] Other (See Comments)    sedation    Past Surgical History:  Procedure Laterality Date   BREAST BIOPSY Right 03/15/2019   stereo biopsy/ x clip/ benign   COLONOSCOPY WITH PROPOFOL N/A 07/27/2015   Procedure: COLONOSCOPY WITH PROPOFOL;  Surgeon: Lucilla Lame, MD;  Location: [place];  Service: Endoscopy;  Laterality: N/A;   COLONOSCOPY WITH PROPOFOL N/A 03/14/2016   Procedure: COLONOSCOPY WITH PROPOFOL;  Surgeon: Lucilla Lame, MD;  Location: [place];  Service: Endoscopy;  Laterality: N/A;  Cannot arrive before 0830 AM   COLOSTOMY REVERSAL N/A 09/26/2015   Procedure: COLOSTOMY REVERSAL / TAKE DOWN;  Surgeon: Hubbard Robinson, MD;  Location: [facility];  Service: General;  Laterality: N/A;   LAPAROTOMY N/A 03/01/2015   Procedure: EXPLORATORY LAPAROTOMY, SIGMOID COLECTOMY, COLOSTOMY;  Surgeon: Hubbard Robinson, MD;  Location: [facility];  Service: General;  Laterality: N/A;   POLYPECTOMY  03/14/2016   Procedure: POLYPECTOMY;  Surgeon:  Lucilla Lame, MD;  Location: [facility];  Service: Endoscopy;;    Social History   Tobacco Use   Smoking status: Never   Smokeless tobacco: Never  Vaping Use   Vaping Use: Never used  Substance Use Topics   Alcohol use: No    Alcohol/week: 0.0 standard drinks   Drug use: No     Medication list has been reviewed and updated.  Current Meds  Medication Sig   cholecalciferol (VITAMIN D) 1000 units tablet Take 1,000 Units by mouth daily.   cyanocobalamin 1000 MCG tablet Take 1,000 mcg by mouth daily.   gabapentin (NEURONTIN) 100 MG capsule Take 2 capsules (200 mg total) by mouth at bedtime. (Patient taking differently: Take 200 mg by mouth as needed.)   MAGNESIUM PO Take by mouth.   Meclizine HCl 25 MG CHEW Chew by mouth.   melatonin 3 MG TABS tablet Take 3 mg by mouth at bedtime.   mometasone (NASONEX) 50 MCG/ACT nasal spray Place 2 sprays into the nose daily.   Multiple Vitamin (MULTIVITAMIN) tablet Take 1 tablet by mouth daily.   polyethylene glycol (MIRALAX / GLYCOLAX) packet Take 17 g by mouth daily.   vitamin C (ASCORBIC ACID) 500 MG tablet Take 500 mg by mouth daily.    PHQ 2/9 Scores 12/11/2020 11/05/2020 07/31/2020 06/25/2020  PHQ - 2 Score 0 0 0 0  PHQ- 9 Score 0 0 0 0    GAD 7 : Generalized Anxiety Score 12/11/2020 11/05/2020 07/31/2020 06/25/2020  Nervous, Anxious, on Edge 1 0 0 0  Control/stop worrying 0 0 0 0  Worry too much - different things 0 0 0 0  Trouble relaxing 1 0 0 0  Restless 0 0 0 0  Easily annoyed or irritable 0 0 0 0  Afraid - awful might happen 0 0 0 0  Total GAD 7 Score 2 0 0 0  Anxiety Difficulty - - - -    BP Readings from Last 3 Encounters:  12/11/20 124/80  11/22/20 (!) 99/59  11/05/20 116/80    Physical Exam Vitals and nursing note reviewed.  Constitutional:      General: She is not in acute distress.    Appearance: Normal appearance. She is well-developed.  HENT:     Head: Normocephalic and atraumatic.  Neck:     Vascular: No  carotid bruit.  Cardiovascular:     Rate and Rhythm: Normal rate and regular rhythm.  Pulmonary:     Effort: Pulmonary effort is normal. No respiratory distress.     Breath sounds: No wheezing or rhonchi.  Musculoskeletal:     Cervical back: Normal range of motion.     Right lower leg: No edema.     Left lower leg: No edema.  Lymphadenopathy:     Cervical: No cervical adenopathy.  Skin:    General: Skin is warm and dry.     Findings: No rash.  Neurological:     Mental  Status: She is alert and oriented to person, place, and time.  Psychiatric:        Mood and Affect: Mood normal.        Behavior: Behavior normal.    Wt Readings from Last 3 Encounters:  12/11/20 162 lb (73.5 kg)  11/05/20 157 lb (71.2 kg)  07/31/20 162 lb (73.5 kg)    BP 124/80   Pulse 78   Temp 98 F (36.7 C) (Oral)   Ht '5\' 4"'$  (1.626 m)   Wt 162 lb (73.5 kg)   SpO2 97%   BMI 27.81 kg/m   Assessment and Plan: 1. Vestibular migraine Recent trip to the ED - imaging benign Continue magnesium daily Follow up with Neurology if recurrent Pt advised not to take sumatriptan for headaches with focal neurological sx  2. Periodic limb movement sleep disorder Not currently active. She is sleeping well.  She has gabapentin to take if needed  3. Dizziness, nonspecific Will try PRN low dose valium - diazepam (VALIUM) 2 MG tablet; Take 1 tablet (2 mg total) by mouth every 6 (six) hours as needed (vertigo).  Dispense: 30 tablet; Refill: 0   Partially dictated using Editor, commissioning. Any errors are unintentional.  Halina Maidens, MD [facility]  12/11/2020

## 2021-02-06 ENCOUNTER — Other Ambulatory Visit: Payer: Self-pay

## 2021-02-06 ENCOUNTER — Ambulatory Visit (INDEPENDENT_AMBULATORY_CARE_PROVIDER_SITE_OTHER): Payer: Medicare HMO | Admitting: Internal Medicine

## 2021-02-06 ENCOUNTER — Encounter: Payer: Self-pay | Admitting: Internal Medicine

## 2021-02-06 VITALS — BP 112/78 | HR 87 | Temp 98.1°F | Ht 64.0 in | Wt 161.8 lb

## 2021-02-06 DIAGNOSIS — J029 Acute pharyngitis, unspecified: Secondary | ICD-10-CM | POA: Diagnosis not present

## 2021-02-06 MED ORDER — AMOXICILLIN 875 MG PO TABS: 875.0000 mg | ORAL_TABLET | Freq: Two times a day (BID) | ORAL | 0 refills | Status: AC

## 2021-02-06 NOTE — Progress Notes (Signed)
Date:  02/06/2021   Name:  REESA GOTSCHALL   DOB:  June 05, 1955   MRN:  [id number]   Chief Complaint: Cough  Sore Throat  This is a new problem. The current episode started in the past 7 days. The problem has been waxing and waning. Associated symptoms include congestion, coughing, headaches, swollen glands and trouble swallowing. Pertinent negatives include no ear pain.  Took a Covid test at home this morning and it was Negative.   Lab Results  Component Value Date   CREATININE 0.80 11/21/2020   BUN 17 11/21/2020   NA 142 11/21/2020   K 4.1 11/21/2020   CL 106 11/21/2020   CO2 26 11/21/2020   Lab Results  Component Value Date   CHOL 212 (H) 06/25/2020   HDL 70 06/25/2020   LDLCALC 118 (H) 06/25/2020   TRIG 136 06/25/2020   CHOLHDL 3.0 06/25/2020   Lab Results  Component Value Date   TSH 3.570 06/25/2020   Lab Results  Component Value Date   HGBA1C 5.5 11/27/2017   Lab Results  Component Value Date   WBC 8.3 11/21/2020   HGB 13.9 11/21/2020   HCT 41.0 11/21/2020   MCV 97.0 11/21/2020   PLT 263 11/21/2020   Lab Results  Component Value Date   ALT 19 11/21/2020   AST 22 11/21/2020   ALKPHOS 85 11/21/2020   BILITOT 0.5 11/21/2020     Review of Systems  Constitutional:  Negative for chills, fatigue and fever.  HENT:  Positive for congestion, sore throat, trouble swallowing and voice change. Negative for ear pain and postnasal drip.   Respiratory:  Positive for cough. Negative for chest tightness and wheezing.   Neurological:  Positive for headaches.   Patient Active Problem List   Diagnosis Date Noted   Dizziness, nonspecific 07/31/2020   Periodic limb movement sleep disorder 03/14/2020   Osteopenia determined by x-ray 09/22/2019   Elevated LDL cholesterol level 07/18/2019   Vestibular migraine 11/27/2017   Hx of diverticulitis of colon    Benign neoplasm of ascending colon    History of colostomy reversal     Allergies  Allergen Reactions    Codeine Nausea Only   Doxycycline Nausea And Vomiting   Levofloxacin Other (See Comments)    insomnia   Effexor [Venlafaxine] Nausea Only    And dizziness   Sulfa Antibiotics Rash   Topamax [Topiramate] Other (See Comments)    sedation    Past Surgical History:  Procedure Laterality Date   BREAST BIOPSY Right 03/15/2019   stereo biopsy/ x clip/ benign   COLONOSCOPY WITH PROPOFOL N/A 07/27/2015   Procedure: COLONOSCOPY WITH PROPOFOL;  Surgeon: Lucilla Lame, MD;  Location: [place];  Service: Endoscopy;  Laterality: N/A;   COLONOSCOPY WITH PROPOFOL N/A 03/14/2016   Procedure: COLONOSCOPY WITH PROPOFOL;  Surgeon: Lucilla Lame, MD;  Location: [place];  Service: Endoscopy;  Laterality: N/A;  Cannot arrive before 0830 AM   COLOSTOMY REVERSAL N/A 09/26/2015   Procedure: COLOSTOMY REVERSAL / TAKE DOWN;  Surgeon: Hubbard Robinson, MD;  Location: [facility];  Service: General;  Laterality: N/A;   LAPAROTOMY N/A 03/01/2015   Procedure: EXPLORATORY LAPAROTOMY, SIGMOID COLECTOMY, COLOSTOMY;  Surgeon: Hubbard Robinson, MD;  Location: [facility];  Service: General;  Laterality: N/A;   POLYPECTOMY  03/14/2016   Procedure: POLYPECTOMY;  Surgeon: Lucilla Lame, MD;  Location: [place];  Service: Endoscopy;;    Social History   Tobacco Use   Smoking  status: Never   Smokeless tobacco: Never  Vaping Use   Vaping Use: Never used  Substance Use Topics   Alcohol use: No    Alcohol/week: 0.0 standard drinks   Drug use: No     Medication list has been reviewed and updated.  Current Meds  Medication Sig   cholecalciferol (VITAMIN D) 1000 units tablet Take 1,000 Units by mouth daily.   cyanocobalamin 1000 MCG tablet Take 1,000 mcg by mouth daily.   diazepam (VALIUM) 2 MG tablet Take 1 tablet (2 mg total) by mouth every 6 (six) hours as needed (vertigo).   gabapentin (NEURONTIN) 100 MG capsule Take 2 capsules (200 mg total) by mouth at bedtime. (Patient taking  differently: Take 200 mg by mouth as needed.)   MAGNESIUM PO Take by mouth.   Meclizine HCl 25 MG CHEW Chew by mouth.   melatonin 3 MG TABS tablet Take 3 mg by mouth at bedtime.   mometasone (NASONEX) 50 MCG/ACT nasal spray Place 2 sprays into the nose daily.   Multiple Vitamin (MULTIVITAMIN) tablet Take 1 tablet by mouth daily.   polyethylene glycol (MIRALAX / GLYCOLAX) packet Take 17 g by mouth daily.   vitamin C (ASCORBIC ACID) 500 MG tablet Take 500 mg by mouth daily.    PHQ 2/9 Scores 02/06/2021 12/11/2020 11/05/2020 07/31/2020  PHQ - 2 Score 0 0 0 0  PHQ- 9 Score 0 0 0 0    GAD 7 : Generalized Anxiety Score 02/06/2021 12/11/2020 11/05/2020 07/31/2020  Nervous, Anxious, on Edge 0 1 0 0  Control/stop worrying 0 0 0 0  Worry too much - different things 0 0 0 0  Trouble relaxing 0 1 0 0  Restless 0 0 0 0  Easily annoyed or irritable 0 0 0 0  Afraid - awful might happen 0 0 0 0  Total GAD 7 Score 0 2 0 0  Anxiety Difficulty Not difficult at all - - -    BP Readings from Last 3 Encounters:  02/06/21 112/78  12/11/20 124/80  11/22/20 (!) 99/59    Physical Exam Constitutional:      Appearance: Normal appearance.  HENT:     Right Ear: Tympanic membrane and ear canal normal.     Left Ear: Tympanic membrane and ear canal normal.     Nose:     Right Sinus: No maxillary sinus tenderness or frontal sinus tenderness.     Left Sinus: No maxillary sinus tenderness or frontal sinus tenderness.     Mouth/Throat:     Pharynx: Posterior oropharyngeal erythema present. No oropharyngeal exudate or uvula swelling.  Cardiovascular:     Rate and Rhythm: Normal rate and regular rhythm.     Pulses: Normal pulses.  Pulmonary:     Effort: Pulmonary effort is normal.     Breath sounds: Normal breath sounds. No wheezing or rhonchi.  Musculoskeletal:     Cervical back: Normal range of motion.  Lymphadenopathy:     Cervical: Cervical adenopathy present.  Neurological:     Mental Status: She is alert.     Wt Readings from Last 3 Encounters:  02/06/21 161 lb 12.8 oz (73.4 kg)  12/11/20 162 lb (73.5 kg)  11/05/20 157 lb (71.2 kg)    BP 112/78   Pulse 87   Temp 98.1 F (36.7 C) (Oral)   Ht 5\' 4"  (1.626 m)   Wt 161 lb 12.8 oz (73.4 kg)   SpO2 97%   BMI 27.77 kg/m   Assessment  and Plan: 1. Pharyngitis, unspecified etiology Suspect Strept but will not swab. Warm liquids, Tylenol, voice rest Follow up if worsening - amoxicillin (AMOXIL) 875 MG tablet; Take 1 tablet (875 mg total) by mouth 2 (two) times daily for 10 days.  Dispense: 20 tablet; Refill: 0   Partially dictated using Editor, commissioning. Any errors are unintentional.  Halina Maidens, MD [facility]  02/06/2021

## 2021-02-07 ENCOUNTER — Ambulatory Visit: Payer: Medicare HMO | Admitting: Internal Medicine

## 2021-02-14 ENCOUNTER — Other Ambulatory Visit: Payer: Self-pay

## 2021-02-14 ENCOUNTER — Ambulatory Visit
Admission: RE | Admit: 2021-02-14 | Discharge: 2021-02-14 | Disposition: A | Discharge status: Home / Self Care | Payer: Medicare HMO | Source: Ambulatory Visit | Attending: Internal Medicine | Admitting: Internal Medicine

## 2021-02-14 DIAGNOSIS — Z1231 Encounter for screening mammogram for malignant neoplasm of breast: Secondary | ICD-10-CM | POA: Insufficient documentation

## 2021-03-13 DIAGNOSIS — H40003 Preglaucoma, unspecified, bilateral: Secondary | ICD-10-CM | POA: Diagnosis not present

## 2021-05-09 DIAGNOSIS — H524 Presbyopia: Secondary | ICD-10-CM | POA: Diagnosis not present

## 2021-05-09 DIAGNOSIS — H52209 Unspecified astigmatism, unspecified eye: Secondary | ICD-10-CM | POA: Diagnosis not present

## 2021-05-09 DIAGNOSIS — H5213 Myopia, bilateral: Secondary | ICD-10-CM | POA: Diagnosis not present

## 2021-05-22 ENCOUNTER — Ambulatory Visit (INDEPENDENT_AMBULATORY_CARE_PROVIDER_SITE_OTHER): Payer: No Typology Code available for payment source

## 2021-05-22 DIAGNOSIS — Z0000 Encounter for general adult medical examination without abnormal findings: Secondary | ICD-10-CM | POA: Diagnosis not present

## 2021-05-22 NOTE — Progress Notes (Signed)
Subjective:   Natalie [name] is a 66 y.o. female who presents for Medicare Annual (Subsequent) preventive examination.  Virtual Visit via Telephone Note  I connected with  Natalie [name] on 05/22/21 at 10:40 AM EST by telephone and verified that I am speaking with the correct person using two identifiers.  Location: Patient: home Provider: [facility] Persons participating in the virtual visit: [place]   I discussed the limitations, risks, security and privacy concerns of performing an evaluation and management service by telephone and the availability of in person appointments. The patient expressed understanding and agreed to proceed.  Interactive audio and video telecommunications were attempted between this nurse and patient, however failed, due to patient having technical difficulties OR patient did not have access to video capability.  We continued and completed visit with audio only.  Some vital signs may be absent or patient reported.   Clemetine Marker, LPN   Review of Systems     Cardiac Risk Factors include: advanced age (>80men, >30 women)     Objective:    There were no vitals filed for this visit. There is no height or weight on file to calculate BMI.  Advanced Directives 05/22/2021 11/21/2020 05/16/2020 03/10/2018 01/02/2017 03/14/2016 09/26/2015  Does Patient Have a Medical Advance Directive? Yes No Yes Yes No Yes Yes  Type of Paramedic of [place];Living will - [place];Living will Healthcare Power of [place]  Does patient want to make changes to medical advance directive? - - - No - Patient declined - No - Patient declined No - Patient declined  Copy of [place] in Chart? No - copy requested - No - copy requested No - copy requested - Yes -  Would patient like information on creating a medical advance directive? - No - Guardian  declined - - - - -    Current Medications (verified) Outpatient Encounter Medications as of 05/22/2021  Medication Sig   CALCIUM 600 1500 (600 Ca) MG TABS tablet    cholecalciferol (VITAMIN D) 1000 units tablet Take 1,000 Units by mouth daily.   cyanocobalamin 1000 MCG tablet Take 1,000 mcg by mouth daily.   diazepam (VALIUM) 2 MG tablet Take 1 tablet (2 mg total) by mouth every 6 (six) hours as needed (vertigo).   gabapentin (NEURONTIN) 100 MG capsule Take 2 capsules (200 mg total) by mouth at bedtime. (Patient taking differently: Take 200 mg by mouth as needed.)   MAGNESIUM PO Take by mouth.   Meclizine HCl 25 MG CHEW Chew by mouth.   melatonin 3 MG TABS tablet Take 3 mg by mouth at bedtime.   mometasone (NASONEX) 50 MCG/ACT nasal spray Place 2 sprays into the nose daily.   Multiple Vitamin (MULTIVITAMIN) tablet Take 1 tablet by mouth daily.   polyethylene glycol (MIRALAX / GLYCOLAX) packet Take 17 g by mouth daily.   vitamin C (ASCORBIC ACID) 500 MG tablet Take 500 mg by mouth daily.   No facility-administered encounter medications on file as of 05/22/2021.    Allergies (verified) Codeine, Doxycycline, Levofloxacin, Effexor [venlafaxine], Sulfa antibiotics, and Topamax [topiramate]   History: Past Medical History:  Diagnosis Date   Benign neoplasm of sigmoid colon    Chronic constipation    Chronic sinusitis    Cystitis    Diverticulitis    Perforated bowel (HCC)    Pleural effusion on right 2016   Preventative health care 05/24/2015  Past Surgical History:  Procedure Laterality Date   BREAST BIOPSY Right 03/15/2019   stereo biopsy/ x clip/ benign   COLONOSCOPY WITH PROPOFOL N/A 07/27/2015   Procedure: COLONOSCOPY WITH PROPOFOL;  Surgeon: Lucilla Lame, MD;  Location: [place];  Service: Endoscopy;  Laterality: N/A;   COLONOSCOPY WITH PROPOFOL N/A 03/14/2016   Procedure: COLONOSCOPY WITH PROPOFOL;  Surgeon: Lucilla Lame, MD;  Location: [place];  Service:  Endoscopy;  Laterality: N/A;  Cannot arrive before 0830 AM   COLOSTOMY REVERSAL N/A 09/26/2015   Procedure: COLOSTOMY REVERSAL / TAKE DOWN;  Surgeon: Hubbard Robinson, MD;  Location: [facility];  Service: General;  Laterality: N/A;   LAPAROTOMY N/A 03/01/2015   Procedure: EXPLORATORY LAPAROTOMY, SIGMOID COLECTOMY, COLOSTOMY;  Surgeon: Hubbard Robinson, MD;  Location: [facility];  Service: General;  Laterality: N/A;   POLYPECTOMY  03/14/2016   Procedure: POLYPECTOMY;  Surgeon: Lucilla Lame, MD;  Location: [place];  Service: Endoscopy;;   Family History  Problem Relation Age of Onset   Stroke Mother    Arthritis Father    Lung cancer Father    Colon cancer Maternal Aunt    Breast cancer Paternal Aunt    Heart attack Brother    Social History   Socioeconomic History   Marital status: Married    Spouse name: Not on file   Number of children: 0   Years of education: Not on file   Highest education level: Not on file  Occupational History   Occupation: Retired    Comment: Therapist, music  Tobacco Use   Smoking status: Never   Smokeless tobacco: Never  Vaping Use   Vaping Use: Never used  Substance and Sexual Activity   Alcohol use: No    Alcohol/week: 0.0 standard drinks   Drug use: No   Sexual activity: Not on file  Other Topics Concern   Not on file  Social History Narrative   Married. No children of her own but has step children   Worked for [employer] in [place]. Retired.    Is a Equities trader.   Enjoys spending time with family, reading, walking. She is raising her granddaughter.   Social Determinants of Health   Financial Resource Strain: Low Risk    Difficulty of Paying Living Expenses: Not hard at all  Food Insecurity: No Food Insecurity   Worried About Charity fundraiser in the Last Year: Never true   [place] in the Last Year: Never true  Transportation Needs: No Transportation Needs   Lack of Transportation (Medical): No   Lack of  Transportation (Non-Medical): No  Physical Activity: Insufficiently Active   Days of Exercise per Week: 3 days   Minutes of Exercise per Session: 40 min  Stress: No Stress Concern Present   Feeling of Stress : Not at all  Social Connections: Moderately Integrated   Frequency of Communication with Friends and Family: More than three times a week   Frequency of Social Gatherings with Friends and Family: Twice a week   Attends Religious Services: More than 4 times per year   Active Member of [employer] or Organizations: No   Attends Music therapist: Never   Marital Status: Married    Tobacco Counseling Counseling given: Not Answered   Clinical Intake:  Pre-visit preparation completed: Yes  Pain : No/denies pain     Nutritional Risks: None Diabetes: No  How often do you need to have someone help you  when you read instructions, pamphlets, or other written materials from your doctor or pharmacy?: 1 - Never    Interpreter Needed?: No  Information entered by :: Clemetine Marker LPN   Activities of Daily Living In your present state of health, do you have any difficulty performing the following activities: 05/22/2021 02/06/2021  Hearing? N N  Vision? N N  Difficulty concentrating or making decisions? N N  Walking or climbing stairs? N N  Dressing or bathing? N N  Doing errands, shopping? N N  Preparing Food and eating ? N -  Using the Toilet? N -  In the past six months, have you accidently leaked urine? Y -  Do you have problems with loss of bowel control? N -  Managing your Medications? N -  Managing your Finances? N -  Housekeeping or managing your Housekeeping? N -  Some recent data might be hidden    Patient Care Team: Glean Hess, MD as PCP - General (Internal Medicine) Lucilla Lame, MD as Consulting Physician (Gastroenterology) Margaretha Sheffield, MD (Otolaryngology)  Indicate any recent Medical Services you may have received from other than [facility]  providers in the past year (date may be approximate).     Assessment:   This is a routine wellness examination for Natalie [name].  Hearing/Vision screen Hearing Screening - Comments:: Pt denies hearing difficulty Vision Screening - Comments:: [facility] screen[facility] Dr. Edison Pace  Dietary issues and exercise activities discussed: Current Exercise Habits: Home exercise routine, Type of exercise: walking, Time (Minutes): 40, Frequency (Times/Week): 3, Weekly Exercise (Minutes/Week): 120, Intensity: Moderate, Exercise limited by: Other - see comments (vertigo)   Goals Addressed             This Visit's Progress    Patient Stated       Patient states she would like to improve management of vertigo in order to get back to driving independently       Depression Screen PHQ 2/9 Scores 05/22/2021 02/06/2021 12/11/2020 11/05/2020 07/31/2020 06/25/2020 05/16/2020  PHQ - 2 Score 0 0 0 0 0 0 0  PHQ- 9 Score - 0 0 0 0 0 -    Fall Risk Fall Risk  05/22/2021 12/11/2020 11/05/2020 07/31/2020 06/25/2020  Falls in the past year? 0 0 0 0 0  Number falls in past yr: 0 0 0 - -  Injury with Fall? 0 0 0 - -  Risk for fall due to : No Fall Risks No Fall Risks No Fall Risks - -  Follow up Falls prevention discussed Falls evaluation completed Falls evaluation completed Falls evaluation completed Falls evaluation completed    [place]:  Any stairs in or around the home? Yes  If so, are there any without handrails? No  Home free of loose throw rugs in walkways, pet beds, electrical cords, etc? Yes  Adequate lighting in your home to reduce risk of falls? Yes   ASSISTIVE DEVICES UTILIZED TO PREVENT FALLS:  Life alert? No  Use of a cane, walker or w/c? No  Grab bars in the bathroom? Yes  Shower chair or bench in shower? Yes  Elevated toilet seat or a handicapped toilet? No   TIMED UP AND GO:  Was the test performed? No . Telephonic visit.   Cognitive  Function: Normal cognitive status assessed by direct observation by this Nurse Health Advisor. No abnormalities found.          Immunizations Immunization History  Administered Date(s) Administered   Influenza,inj,Quad PF,6+ Mos 02/05/2018   Influenza-Unspecified 01/18/2015, 01/02/2016, 02/11/2017   Tdap 06/27/2009    TDAP status: Due, Education has been provided regarding the importance of this vaccine. Advised may receive this vaccine at local pharmacy or Health Dept. Aware to provide a copy of the vaccination record if obtained from local pharmacy or Health Dept. Verbalized acceptance and understanding.  Flu Vaccine status: Declined, Education has been provided regarding the importance of this vaccine but patient still declined. Advised may receive this vaccine at local pharmacy or Health Dept. Aware to provide a copy of the vaccination record if obtained from local pharmacy or Health Dept. Verbalized acceptance and understanding.  Pneumococcal vaccine status: Declined,  Education has been provided regarding the importance of this vaccine but patient still declined. Advised may receive this vaccine at local pharmacy or Health Dept. Aware to provide a copy of the vaccination record if obtained from local pharmacy or Health Dept. Verbalized acceptance and understanding.   Covid-19 vaccine status: Declined, Education has been provided regarding the importance of this vaccine but patient still declined. Advised may receive this vaccine at local pharmacy or Health Dept.or vaccine clinic. Aware to provide a copy of the vaccination record if obtained from local pharmacy or Health Dept. Verbalized acceptance and understanding.  Qualifies for Shingles Vaccine? Yes   Zostavax completed No   Shingrix Completed?: No.    Education has been provided regarding the importance of this vaccine. Patient has been advised to call insurance company to determine out of pocket expense if they have not yet  received this vaccine. Advised may also receive vaccine at local pharmacy or Health Dept. Verbalized acceptance and understanding.  Screening Tests Health Maintenance  Topic Date Due   TETANUS/TDAP  06/28/2019   COLONOSCOPY (Pts 45-40yrs Insurance coverage will need to be confirmed)  03/14/2021   COVID-19 Vaccine (1) 06/07/2021 (Originally 09/21/1956)   INFLUENZA VACCINE  07/05/2021 (Originally 11/05/2020)   Zoster Vaccines- Shingrix (1 of 2) 08/19/2021 (Originally 03/22/2004)   Pneumonia Vaccine 76+ Years old (1 - PCV) 05/22/2022 (Originally 03/23/2019)   MAMMOGRAM  02/15/2023   DEXA SCAN  Completed   Hepatitis C Screening  Completed   HPV VACCINES  Aged Out    Health Maintenance  Health Maintenance Due  Topic Date Due   TETANUS/TDAP  06/28/2019   COLONOSCOPY (Pts 45-68yrs Insurance coverage will need to be confirmed)  03/14/2021    Colorectal cancer screening: Type of screening: Colonoscopy. Completed 03/14/16. Repeat every 5 years. Pt requests to postpone screening at this time due to vertigo.   Mammogram status: Completed 02/14/21. Repeat every year  Bone Density status: Completed 09/12/19. Results reflect: Bone density results: OSTEOPENIA. Repeat every 2 years.   Lung Cancer Screening: (Low Dose CT Chest recommended if Age 38-80 years, 30 pack-year currently smoking OR have quit w/in 15years.) does not qualify.   Additional Screening:  Hepatitis C Screening: does qualify; Completed 05/31/18  Vision Screening: Recommended annual ophthalmology exams for early detection of glaucoma and other disorders of the eye. Is the patient up to date with their annual eye exam?  Yes  Who is the provider or what is the name of the office in which the patient attends annual eye exams? Dr. Edison Pace.   Dental Screening: Recommended annual dental exams for proper oral hygiene  Community Resource Referral / Chronic Care Management: CRR required this visit?  No   CCM required this visit?  No  Plan:     I have personally reviewed and noted the following in the patients chart:   Medical and social history Use of alcohol, tobacco or illicit drugs  Current medications and supplements including opioid prescriptions.  Functional ability and status Nutritional status Physical activity Advanced directives List of other physicians Hospitalizations, surgeries, and ER visits in previous 12 months Vitals Screenings to include cognitive, depression, and falls Referrals and appointments  In addition, I have reviewed and discussed with patient certain preventive protocols, quality metrics, and best practice recommendations. A written personalized care plan for preventive services as well as general preventive health recommendations were provided to patient.     Clemetine Marker, LPN   [id number]   Nurse Notes: pt c/o ongoing issues with vertigo; scheduled for appt with Dr. Kathyrn Sheriff tomorrow

## 2021-05-22 NOTE — Patient Instructions (Addendum)
[name] , Thank you for taking time to come for your Medicare Wellness Visit. I appreciate your ongoing commitment to your health goals. Please review the following plan we discussed and let me know if I can assist you in the future.   Screening recommendations/referrals: Colonoscopy: done 03/14/16. Due for repeat screening colonoscopy Mammogram: done 02/14/21 Bone Density: done 09/22/19 Recommended yearly ophthalmology/optometry visit for glaucoma screening and checkup Recommended yearly dental visit for hygiene and checkup  Vaccinations: Influenza vaccine: declined Pneumococcal vaccine: declined Tdap vaccine: due Shingles vaccine: Shingrix discussed. Please contact your pharmacy for coverage information.  Covid-19:declined  Advanced directives: Please bring a copy of your health care power of attorney and living will to the office at your convenience.   Conditions/risks identified: Keep up the great work!  Next appointment: Follow up in one year for your annual wellness visit    Preventive Care 65 Years and Older, Female Preventive care refers to lifestyle choices and visits with your health care provider that can promote health and wellness. What does preventive care include? A yearly physical exam. This is also called an annual well check. Dental exams once or twice a year. Routine eye exams. Ask your health care provider how often you should have your eyes checked. Personal lifestyle choices, including: Daily care of your teeth and gums. Regular physical activity. Eating a healthy diet. Avoiding tobacco and drug use. Limiting alcohol use. Practicing safe sex. Taking low-dose aspirin every day. Taking vitamin and mineral supplements as recommended by your health care provider. What happens during an annual well check? The services and screenings done by your health care provider during your annual well check will depend on your age, overall health, lifestyle risk  factors, and family history of disease. Counseling  Your health care provider may ask you questions about your: Alcohol use. Tobacco use. Drug use. Emotional well-being. Home and relationship well-being. Sexual activity. Eating habits. History of falls. Memory and ability to understand (cognition). Work and work Statistician. Reproductive health. Screening  You may have the following tests or measurements: Height, weight, and BMI. Blood pressure. Lipid and cholesterol levels. These may be checked every 5 years, or more frequently if you are over 37 years old. Skin check. Lung cancer screening. You may have this screening every year starting at age 63 if you have a 30-pack-year history of smoking and currently smoke or have quit within the past 15 years. Fecal occult blood test (FOBT) of the stool. You may have this test every year starting at age 46. Flexible sigmoidoscopy or colonoscopy. You may have a sigmoidoscopy every 5 years or a colonoscopy every 10 years starting at age 7. Hepatitis C blood test. Hepatitis B blood test. Sexually transmitted disease (STD) testing. Diabetes screening. This is done by checking your blood sugar (glucose) after you have not eaten for a while (fasting). You may have this done every 1-3 years. Bone density scan. This is done to screen for osteoporosis. You may have this done starting at age 38. Mammogram. This may be done every 1-2 years. Talk to your health care provider about how often you should have regular mammograms. Talk with your health care provider about your test results, treatment options, and if necessary, the need for more tests. Vaccines  Your health care provider may recommend certain vaccines, such as: Influenza vaccine. This is recommended every year. Tetanus, diphtheria, and acellular pertussis (Tdap, Td) vaccine. You may need a Td booster every 10 years. Zoster vaccine. You may need  this after age 3. Pneumococcal 13-valent  conjugate (PCV13) vaccine. One dose is recommended after age 63. Pneumococcal polysaccharide (PPSV23) vaccine. One dose is recommended after age 40. Talk to your health care provider about which screenings and vaccines you need and how often you need them. This information is not intended to replace advice given to you by your health care provider. Make sure you discuss any questions you have with your health care provider. Document Released: 04/20/2015 Document Revised: 12/12/2015 Document Reviewed: 01/23/2015 Elsevier Interactive Patient Education  2017 [place] Prevention in the Home Falls can cause injuries. They can happen to people of all ages. There are many things you can do to make your home safe and to help prevent falls. What can I do on the outside of my home? Regularly fix the edges of walkways and driveways and fix any cracks. Remove anything that might make you trip as you walk through a door, such as a raised step or threshold. Trim any bushes or trees on the path to your home. Use bright outdoor lighting. Clear any walking paths of anything that might make someone trip, such as rocks or tools. Regularly check to see if handrails are loose or broken. Make sure that both sides of any steps have handrails. Any raised decks and porches should have guardrails on the edges. Have any leaves, snow, or ice cleared regularly. Use sand or salt on walking paths during winter. Clean up any spills in your garage right away. This includes oil or grease spills. What can I do in the bathroom? Use night lights. Install grab bars by the toilet and in the tub and shower. Do not use towel bars as grab bars. Use non-skid mats or decals in the tub or shower. If you need to sit down in the shower, use a plastic, non-slip stool. Keep the floor dry. Clean up any water that spills on the floor as soon as it happens. Remove soap buildup in the tub or shower regularly. Attach bath mats  securely with double-sided non-slip rug tape. Do not have throw rugs and other things on the floor that can make you trip. What can I do in the bedroom? Use night lights. Make sure that you have a light by your bed that is easy to reach. Do not use any sheets or blankets that are too big for your bed. They should not hang down onto the floor. Have a firm chair that has side arms. You can use this for support while you get dressed. Do not have throw rugs and other things on the floor that can make you trip. What can I do in the kitchen? Clean up any spills right away. Avoid walking on wet floors. Keep items that you use a lot in easy-to-reach places. If you need to reach something above you, use a strong step stool that has a grab bar. Keep electrical cords out of the way. Do not use floor polish or wax that makes floors slippery. If you must use wax, use non-skid floor wax. Do not have throw rugs and other things on the floor that can make you trip. What can I do with my stairs? Do not leave any items on the stairs. Make sure that there are handrails on both sides of the stairs and use them. Fix handrails that are broken or loose. Make sure that handrails are as long as the stairways. Check any carpeting to make sure that it is firmly attached to  the stairs. Fix any carpet that is loose or worn. Avoid having throw rugs at the top or bottom of the stairs. If you do have throw rugs, attach them to the floor with carpet tape. Make sure that you have a light switch at the top of the stairs and the bottom of the stairs. If you do not have them, ask someone to add them for you. What else can I do to help prevent falls? Wear shoes that: Do not have high heels. Have rubber bottoms. Are comfortable and fit you well. Are closed at the toe. Do not wear sandals. If you use a stepladder: Make sure that it is fully opened. Do not climb a closed stepladder. Make sure that both sides of the stepladder  are locked into place. Ask someone to hold it for you, if possible. Clearly mark and make sure that you can see: Any grab bars or handrails. First and last steps. Where the edge of each step is. Use tools that help you move around (mobility aids) if they are needed. These include: Canes. Walkers. Scooters. Crutches. Turn on the lights when you go into a dark area. Replace any light bulbs as soon as they burn out. Set up your furniture so you have a clear path. Avoid moving your furniture around. If any of your floors are uneven, fix them. If there are any pets around you, be aware of where they are. Review your medicines with your doctor. Some medicines can make you feel dizzy. This can increase your chance of falling. Ask your doctor what other things that you can do to help prevent falls. This information is not intended to replace advice given to you by your health care provider. Make sure you discuss any questions you have with your health care provider. Document Released: 01/18/2009 Document Revised: 08/30/2015 Document Reviewed: 04/28/2014 Elsevier Interactive Patient Education  2017 [employer].

## 2021-05-23 DIAGNOSIS — H8109 Meniere's disease, unspecified ear: Secondary | ICD-10-CM | POA: Diagnosis not present

## 2021-05-23 DIAGNOSIS — J301 Allergic rhinitis due to pollen: Secondary | ICD-10-CM | POA: Diagnosis not present

## 2021-06-07 DIAGNOSIS — R42 Dizziness and giddiness: Secondary | ICD-10-CM | POA: Diagnosis not present

## 2021-06-26 DIAGNOSIS — N390 Urinary tract infection, site not specified: Secondary | ICD-10-CM | POA: Diagnosis not present

## 2021-06-27 ENCOUNTER — Encounter: Payer: Medicare HMO | Admitting: Internal Medicine

## 2021-07-09 ENCOUNTER — Ambulatory Visit: Payer: No Typology Code available for payment source | Attending: Otolaryngology | Admitting: Physical Therapy

## 2021-07-09 ENCOUNTER — Encounter: Payer: Self-pay | Admitting: Physical Therapy

## 2021-07-09 DIAGNOSIS — R42 Dizziness and giddiness: Secondary | ICD-10-CM | POA: Diagnosis not present

## 2021-07-09 NOTE — Therapy (Signed)
?OUTPATIENT PHYSICAL THERAPY VESTIBULAR EVALUATION ? ? ? ? ?Patient Name: Natalie [name] ?MRN: [id number] ?DOB:January 04, 1956, 66 y.o., female ?Today's Date: 07/09/2021 ? ?PCP: Glean Hess, MD ?REFERRING PROVIDER: Margaretha Sheffield, MD ? ? PT End of Session - 07/09/21 0856   ? ? Visit Number 1   ? Number of Visits 9   ? Date for PT Re-Evaluation 09/03/21   ? PT Start Time [phone]   ? PT Stop Time 1005   ? PT Time Calculation (min) 68 min   ? Equipment Utilized During Treatment Gait belt   ? Activity Tolerance Patient tolerated treatment well   ? Behavior During Therapy [facility] for tasks assessed/performed   ? ?  ?  ? ?  ? ?Past Medical History:  ?Diagnosis Date  ? Benign neoplasm of sigmoid colon   ? Chronic constipation   ? Chronic sinusitis   ? Cystitis   ? Diverticulitis   ? Perforated bowel ([place])   ? Pleural effusion on right 2016  ? Preventative health care 05/24/2015  ? ?Past Surgical History:  ?Procedure Laterality Date  ? BREAST BIOPSY Right 03/15/2019  ? stereo biopsy/ x clip/ benign  ? COLONOSCOPY WITH PROPOFOL N/A 07/27/2015  ? Procedure: COLONOSCOPY WITH PROPOFOL;  Surgeon: Lucilla Lame, MD;  Location: [place];  Service: Endoscopy;  Laterality: N/A;  ? COLONOSCOPY WITH PROPOFOL N/A 03/14/2016  ? Procedure: COLONOSCOPY WITH PROPOFOL;  Surgeon: Lucilla Lame, MD;  Location: [place];  Service: Endoscopy;  Laterality: N/A;  Cannot arrive before 0830 AM  ? COLOSTOMY REVERSAL N/A 09/26/2015  ? Procedure: COLOSTOMY REVERSAL / TAKE DOWN;  Surgeon: Hubbard Robinson, MD;  Location: [facility];  Service: General;  Laterality: N/A;  ? LAPAROTOMY N/A 03/01/2015  ? Procedure: EXPLORATORY LAPAROTOMY, SIGMOID COLECTOMY, COLOSTOMY;  Surgeon: Hubbard Robinson, MD;  Location: [facility];  Service: General;  Laterality: N/A;  ? POLYPECTOMY  03/14/2016  ? Procedure: POLYPECTOMY;  Surgeon: Lucilla Lame, MD;  Location: [place];  Service: Endoscopy;;  ? ?Patient Active Problem List  ? Diagnosis Date Noted  ?  Dizziness, nonspecific 07/31/2020  ? Periodic limb movement sleep disorder 03/14/2020  ? Osteopenia determined by x-ray 09/22/2019  ? Elevated LDL cholesterol level 07/18/2019  ? Vestibular migraine 11/27/2017  ? Hx of diverticulitis of colon   ? Benign neoplasm of ascending colon   ? History of colostomy reversal   ? ?ONSET DATE: 2016 after surgery ? ?REFERRING DIAG: R42 dysequilibrium ? ?THERAPY DIAG:  ?Dizziness and giddiness ? ?SUBJECTIVE:  ?Patient reports that she began to have dizziness in 2016 after she had surgery.  Patient states that she has a baseline level of disequilibrium but that she has had 3-4 episodes of vertigo per year as well.  Patient reports that she has modified her movements and avoid certain positions to try to avoid increase in her symptoms.  Patient states she feels her symptoms have improved a little bit since she had the VNG testing in March. ?Pt accompanied by: self ? ?PERTINENT HISTORY:  ?Patient reports that she began to have dizziness in 2016 after she had a bout of sepsis and underwent surgery where she needed to have a colostomy and a chest tube.  Patient reports after the surgery she began having equilibrium issues.  Patient reports she did receive ototoxic medications such as gentamicin after the surgery.  Patient states she saw Dr. Brigitte Pulse, neurologist, in regards to her dizziness symptoms.  Patient states Dr. Brigitte Pulse felt that she did  not fit the classic category for vestibular migraines.  Then, patient had a work-up for M?ni?re's disease which Dr. Freddy Jaksch been felt she did not have per patient report.  Patient reports that he said she had visual perception difficulties.  Patient saw Dr. Kathyrn Sheriff, ENT physician, and patient reports that Dr. Kathyrn Sheriff felt that vestibular therapy might help strengthen and help her if it progressed down the road. Per Dr. Maisie Fus medical record, patient has diagnosis of disequilibrium, dizziness and giddiness, M?ni?re's disease, and active  cochleovestibular disorder.  On 06/07/2021 patient had VNG testing per medical record that revealed a 42% right unilateral weakness and abnormal smooth pursuits as well as abnormal saccades.  Patient reports that Dr. Kathyrn Sheriff thought it was central nervous system and not M?ni?re's disease. Patient reports that the VNG testing was intense and that it reproduced her symptoms but that since the VNG testing her symptoms have been a little better overall. Patient describes her dizziness symptoms as a constant floating and disequilibrium sensation, "staggery", and vertigo.  Patient states she has had occasional distinct episodes of vertigo.  Patient reports the last episode of vertigo was in December 2022.  Pt reports staying still and closing her eyes it will subside in 15 minutes but if she moves it starts right back up and this lasts about a day. And then she gets increase dysequilibrium feeling for about a week until it drops back down to her baseline level of floaty feeling. Patient reports she has about 3-4 episodes of vertigo per year.  Patient reports that sometimes changes in the seasons and weather such as when a storm system is coming in that she will notice this bringing on her symptoms.  Patient states she was doing some home renovation and staining of room and noticed that this brought on symptoms.  Patient states she veers when she walks at times.  Patient reports that she has had several sinus infections and when they were treated this helped to decrease her symptoms some as well.  Patient has tried meclizine and Valium but states they did not help her dizziness symptoms and that meclizine makes her sleepy.  She reports driving is very challenging and states she has difficulty with peripheral things going by affecting her vision when in the car.  Patient states she had an episode while driving of dizziness where she got tunnel vision once and that she now limits her driving. Patient reports that driving also  causes her fatigue.   Patient reports that she has conditioned herself to live differently and that she tries to avoid making sudden movements and positions where she might get unsteady.  Patient reports she walks 2 miles a day with her walking stick. ? ?SYMPTOM BEHAVIOR: ? ?Type of dizziness: Imbalance (Disequilibrium), Spinning/Vertigo, and Unsteady with head/body turns ?Frequency: daily sensation of disequilibrium and punctuated episodes 3-4 times a year of vertigo ?Duration: baseline disequilibrium sensation all the time ?Aggravating factors: Induced by motion: bending down to the ground, turning body quickly, turning head quickly, and driving, Worse in the dark, Worse outside or in busy environment, and stress ?  ?Relieving factors: lying supine, closing eyes, rest, slow movements, and avoid busy/distracting environments ? ?Progression of symptoms: better because patient reports she has learned to adapt and avoids certain movements and positions; Reports has been a little better since having had the VNG ?History of similar episodes: none prior to 2016 ? ?Auditory complaints (tinnitus, pain, drainage): "crickets" "noise" at times in both ears; denies hearing loss ?Vision (  last eye exam, diplopia, recent changes): wears eye glasses, got new glasses this year and had last visit in December.  ?She has noticed depth perception issues. ?Current Symptoms: (dysarthria, dysphagia, drop attacks, bowel and bladder changes, recent weight loss/gain)    Review of systems negative for red flags.   ?   ?DIAGNOSTIC FINDINGS: per medical record, VNG revealed 43% right unilateral hypofunction ? ?PAIN: ?Are you having pain? No ? ?PRECAUTIONS: Fall ? ?FALLS: Has patient fallen in last 6 months? No ? ?LIVING ENVIRONMENT: ?Lives with: lives with their spouse ?Lives in: House/apartment ?Stairs: Yes: External: 2 steps; can reach both and they have a ramp ?Has following equipment at home: Gilford Rile - 2 wheeled, Shower bench, bed side  commode, and Grab bars ? ?PLOF: Independent with community mobility with device uses a walking stick in the community and nothing in the house ? ?PATIENT GOALS driving, stronger, less episodes and intensity of vertigo

## 2021-07-15 ENCOUNTER — Encounter: Payer: Self-pay | Admitting: Internal Medicine

## 2021-07-15 ENCOUNTER — Ambulatory Visit (INDEPENDENT_AMBULATORY_CARE_PROVIDER_SITE_OTHER): Payer: No Typology Code available for payment source | Admitting: Internal Medicine

## 2021-07-15 VITALS — BP 108/70 | HR 67 | Ht 64.0 in | Wt 165.6 lb

## 2021-07-15 DIAGNOSIS — Z1231 Encounter for screening mammogram for malignant neoplasm of breast: Secondary | ICD-10-CM | POA: Diagnosis not present

## 2021-07-15 DIAGNOSIS — Z23 Encounter for immunization: Secondary | ICD-10-CM

## 2021-07-15 DIAGNOSIS — E7800 Pure hypercholesterolemia, unspecified: Secondary | ICD-10-CM

## 2021-07-15 DIAGNOSIS — Z1211 Encounter for screening for malignant neoplasm of colon: Secondary | ICD-10-CM

## 2021-07-15 DIAGNOSIS — N3001 Acute cystitis with hematuria: Secondary | ICD-10-CM

## 2021-07-15 DIAGNOSIS — M8580 Other specified disorders of bone density and structure, unspecified site: Secondary | ICD-10-CM | POA: Diagnosis not present

## 2021-07-15 DIAGNOSIS — Z0000 Encounter for general adult medical examination without abnormal findings: Secondary | ICD-10-CM

## 2021-07-15 LAB — POCT URINALYSIS DIPSTICK
Bilirubin, UA: NEGATIVE
Blood, UA: NEGATIVE
Glucose, UA: NEGATIVE
Ketones, UA: NEGATIVE
Leukocytes, UA: NEGATIVE
Nitrite, UA: NEGATIVE
Protein, UA: NEGATIVE
Spec Grav, UA: 1.02 (ref 1.010–1.025)
Urobilinogen, UA: 0.2 E.U./dL
pH, UA: 5 (ref 5.0–8.0)

## 2021-07-15 NOTE — Progress Notes (Signed)
? ? ?Date:  07/15/2021  ? ?Name:  Natalie [name]   DOB:  [birth year]   MRN:  [id number] ? ? ?Chief Complaint: Annual Exam ?CHAZLYN CUDE is a 66 y.o. female who presents today for her Complete Annual Exam. She feels well. She reports walking 2.5 miles 3 times weekly. She reports she is sleeping fairly well. Breast complaints - none. ? ?Mammogram: 02/2021 ?DEXA: 09/2019 osteopenia ?Pap smear: discontinued ?Colonoscopy: 03/2016 repeat 5 yrs - due ? ?Health Maintenance Due  ?Topic Date Due  ? TETANUS/TDAP  06/28/2019  ?  ?Immunization History  ?Administered Date(s) Administered  ? Influenza,inj,Quad PF,6+ Mos 02/05/2018  ? Influenza-Unspecified 01/18/2015, 01/02/2016, 02/11/2017  ? Tdap 06/27/2009  ? ?Vertigo - she has seen ENT and tested for Meniere's syndrome.  Symptoms now felt to be more central.  MRI done last year was normal.  She has been referred to Vestibular Rehab and is hopeful that this is provide some benefit. ? ?Urinary Tract Infection  ?The current episode started 1 to 4 weeks ago. The problem has been resolved. There has been no fever. Associated symptoms include frequency and urgency. Pertinent negatives include no chills, hematuria or vomiting. She has tried antibiotics (could not finish Cipro) for the symptoms.  ? ?Lab Results  ?Component Value Date  ? NA 142 11/21/2020  ? K 4.1 11/21/2020  ? CO2 26 11/21/2020  ? GLUCOSE 98 11/21/2020  ? BUN 17 11/21/2020  ? CREATININE 0.80 11/21/2020  ? CALCIUM 9.5 11/21/2020  ? EGFR 69 06/25/2020  ? GFRNONAA >60 11/21/2020  ? ?Lab Results  ?Component Value Date  ? CHOL 212 (H) 06/25/2020  ? HDL 70 06/25/2020  ? LDLCALC 118 (H) 06/25/2020  ? TRIG 136 06/25/2020  ? CHOLHDL 3.0 06/25/2020  ? ?Lab Results  ?Component Value Date  ? TSH 3.570 06/25/2020  ? ?Lab Results  ?Component Value Date  ? HGBA1C 5.5 11/27/2017  ? ?Lab Results  ?Component Value Date  ? WBC 8.3 11/21/2020  ? HGB 13.9 11/21/2020  ? HCT 41.0 11/21/2020  ? MCV 97.0 11/21/2020  ? PLT 263 11/21/2020   ? ?Lab Results  ?Component Value Date  ? ALT 19 11/21/2020  ? AST 22 11/21/2020  ? ALKPHOS 85 11/21/2020  ? BILITOT 0.5 11/21/2020  ? ?Lab Results  ?Component Value Date  ? VD25OH 50.3 06/25/2020  ?  ? ?Review of Systems  ?Constitutional:  Negative for chills, fatigue and fever.  ?HENT:  Negative for congestion, hearing loss, tinnitus, trouble swallowing and voice change.   ?Eyes:  Negative for visual disturbance.  ?Respiratory:  Negative for cough, chest tightness, shortness of breath and wheezing.   ?Cardiovascular:  Negative for chest pain, palpitations and leg swelling.  ?Gastrointestinal:  Negative for abdominal pain, constipation, diarrhea and vomiting.  ?Endocrine: Negative for polydipsia and polyuria.  ?Genitourinary:  Positive for frequency and urgency. Negative for dysuria, genital sores, hematuria, vaginal bleeding and vaginal discharge.  ?Musculoskeletal:  Negative for arthralgias, gait problem and joint swelling.  ?Skin:  Negative for color change and rash.  ?Neurological:  Negative for dizziness, tremors, light-headedness and headaches.  ?Hematological:  Negative for adenopathy. Does not bruise/bleed easily.  ?Psychiatric/Behavioral:  Negative for dysphoric mood and sleep disturbance. The patient is not nervous/anxious.   ? ?Patient Active Problem List  ? Diagnosis Date Noted  ? Dizziness, nonspecific 07/31/2020  ? Periodic limb movement sleep disorder 03/14/2020  ? Osteopenia determined by x-ray 09/22/2019  ? Elevated LDL cholesterol level 07/18/2019  ? Vestibular  migraine 11/27/2017  ? Hx of diverticulitis of colon   ? Benign neoplasm of ascending colon   ? History of colostomy reversal   ? ? ?Allergies  ?Allergen Reactions  ? Codeine Nausea Only  ? Doxycycline Nausea And Vomiting  ? Levofloxacin Other (See Comments)  ?  insomnia  ? Effexor [Venlafaxine] Nausea Only  ?  And dizziness  ? Sulfa Antibiotics Rash  ? Topamax [Topiramate] Other (See Comments)  ?  sedation  ? ? ?Past Surgical History:   ?Procedure Laterality Date  ? BREAST BIOPSY Right 03/15/2019  ? stereo biopsy/ x clip/ benign  ? COLONOSCOPY WITH PROPOFOL N/A 07/27/2015  ? Procedure: COLONOSCOPY WITH PROPOFOL;  Surgeon: Lucilla Lame, MD;  Location: [place];  Service: Endoscopy;  Laterality: N/A;  ? COLONOSCOPY WITH PROPOFOL N/A 03/14/2016  ? Procedure: COLONOSCOPY WITH PROPOFOL;  Surgeon: Lucilla Lame, MD;  Location: [place];  Service: Endoscopy;  Laterality: N/A;  Cannot arrive before 0830 AM  ? COLOSTOMY REVERSAL N/A 09/26/2015  ? Procedure: COLOSTOMY REVERSAL / TAKE DOWN;  Surgeon: Hubbard Robinson, MD;  Location: [facility];  Service: General;  Laterality: N/A;  ? LAPAROTOMY N/A 03/01/2015  ? Procedure: EXPLORATORY LAPAROTOMY, SIGMOID COLECTOMY, COLOSTOMY;  Surgeon: Hubbard Robinson, MD;  Location: [facility];  Service: General;  Laterality: N/A;  ? POLYPECTOMY  03/14/2016  ? Procedure: POLYPECTOMY;  Surgeon: Lucilla Lame, MD;  Location: [place];  Service: Endoscopy;;  ? ? ?Social History  ? ?Tobacco Use  ? Smoking status: Never  ? Smokeless tobacco: Never  ?Vaping Use  ? Vaping Use: Never used  ?Substance Use Topics  ? Alcohol use: No  ?  Alcohol/week: 0.0 standard drinks  ? Drug use: No  ? ? ? ?Medication list has been reviewed and updated. ? ?Current Meds  ?Medication Sig  ? CALCIUM 600 1500 (600 Ca) MG TABS tablet   ? cholecalciferol (VITAMIN D) 1000 units tablet Take 1,000 Units by mouth daily.  ? cyanocobalamin 1000 MCG tablet Take 1,000 mcg by mouth daily.  ? diazepam (VALIUM) 2 MG tablet Take 1 tablet (2 mg total) by mouth every 6 (six) hours as needed (vertigo).  ? MAGNESIUM PO Take by mouth.  ? melatonin 3 MG TABS tablet Take 3 mg by mouth at bedtime.  ? mometasone (NASONEX) 50 MCG/ACT nasal spray Place 2 sprays into the nose daily.  ? Multiple Vitamin (MULTIVITAMIN) tablet Take 1 tablet by mouth daily.  ? polyethylene glycol (MIRALAX / GLYCOLAX) packet Take 17 g by mouth daily.  ? vitamin C (ASCORBIC  ACID) 500 MG tablet Take 500 mg by mouth daily.  ? ? ? ?  07/15/2021  ?  9:55 AM 02/06/2021  ? 11:21 AM 12/11/2020  ?  9:51 AM 11/05/2020  ?  1:26 PM  ?GAD 7 : Generalized Anxiety Score  ?Nervous, Anxious, on Edge 0 0 1 0  ?Control/stop worrying 0 0 0 0  ?Worry too much - different things 0 0 0 0  ?Trouble relaxing 0 0 1 0  ?Restless 0 0 0 0  ?Easily annoyed or irritable 0 0 0 0  ?Afraid - awful might happen 0 0 0 0  ?Total GAD 7 Score 0 0 2 0  ?Anxiety Difficulty Not difficult at all Not difficult at all    ? ? ? ?  07/15/2021  ?  9:55 AM  ?Depression screen PHQ 2/9  ?Decreased Interest 0  ?Down, Depressed, Hopeless 0  ?PHQ - 2 Score  0  ?Altered sleeping 0  ?Tired, decreased energy 0  ?Change in appetite 0  ?Feeling bad or failure about yourself  0  ?Trouble concentrating 0  ?Moving slowly or fidgety/restless 0  ?Suicidal thoughts 0  ?PHQ-9 Score 0  ?Difficult doing work/chores Not difficult at all  ? ? ?BP Readings from Last 3 Encounters:  ?07/15/21 108/70  ?02/06/21 112/78  ?12/11/20 124/80  ? ? ?Physical Exam ?Vitals and nursing note reviewed.  ?Constitutional:   ?   General: She is not in acute distress. ?   Appearance: She is well-developed.  ?HENT:  ?   Head: Normocephalic and atraumatic.  ?   Right Ear: Tympanic membrane and ear canal normal.  ?   Left Ear: Tympanic membrane and ear canal normal.  ?   Nose:  ?   Right Sinus: No maxillary sinus tenderness.  ?   Left Sinus: No maxillary sinus tenderness.  ?Eyes:  ?   General: No scleral icterus.    ?   Right eye: No discharge.     ?   Left eye: No discharge.  ?   Conjunctiva/sclera: Conjunctivae normal.  ?Neck:  ?   Thyroid: No thyromegaly.  ?   Vascular: No carotid bruit.  ?Cardiovascular:  ?   Rate and Rhythm: Normal rate and regular rhythm.  ?   Pulses: Normal pulses.  ?   Heart sounds: Normal heart sounds.  ?Pulmonary:  ?   Effort: Pulmonary effort is normal. No respiratory distress.  ?   Breath sounds: No wheezing.  ?Chest:  ?Breasts: ?   Right: No mass, nipple  discharge, skin change or tenderness.  ?   Left: No mass, nipple discharge, skin change or tenderness.  ?Abdominal:  ?   General: Bowel sounds are normal.  ?   Palpations: Abdomen is soft.  ?   Tenderness: Terie Purser

## 2021-07-16 ENCOUNTER — Encounter: Payer: Self-pay | Admitting: Physical Therapy

## 2021-07-16 ENCOUNTER — Ambulatory Visit: Payer: No Typology Code available for payment source | Admitting: Physical Therapy

## 2021-07-16 ENCOUNTER — Telehealth: Payer: Self-pay

## 2021-07-16 DIAGNOSIS — R42 Dizziness and giddiness: Secondary | ICD-10-CM

## 2021-07-16 LAB — MAGNESIUM: Magnesium: 2 mg/dL (ref 1.6–2.3)

## 2021-07-16 LAB — CBC WITH DIFFERENTIAL/PLATELET
Basophils Absolute: 0.1 10*3/uL (ref 0.0–0.2)
Basos: 1 %
EOS (ABSOLUTE): 0.4 10*3/uL (ref 0.0–0.4)
Eos: 8 %
Hematocrit: 42 % (ref 34.0–46.6)
Hemoglobin: 14.1 g/dL (ref 11.1–15.9)
Immature Grans (Abs): 0 10*3/uL (ref 0.0–0.1)
Immature Granulocytes: 0 %
Lymphocytes Absolute: 1.7 10*3/uL (ref 0.7–3.1)
Lymphs: 34 %
MCH: 32.3 pg (ref 26.6–33.0)
MCHC: 33.6 g/dL (ref 31.5–35.7)
MCV: 96 fL (ref 79–97)
Monocytes Absolute: 0.4 10*3/uL (ref 0.1–0.9)
Monocytes: 9 %
Neutrophils Absolute: 2.5 10*3/uL (ref 1.4–7.0)
Neutrophils: 48 %
Platelets: 279 10*3/uL (ref 150–450)
RBC: 4.37 x10E6/uL (ref 3.77–5.28)
RDW: 12.3 % (ref 11.7–15.4)
WBC: 5.1 10*3/uL (ref 3.4–10.8)

## 2021-07-16 LAB — COMPREHENSIVE METABOLIC PANEL
ALT: 14 IU/L (ref 0–32)
AST: 20 IU/L (ref 0–40)
Albumin/Globulin Ratio: 1.8 (ref 1.2–2.2)
Albumin: 4.5 g/dL (ref 3.8–4.8)
Alkaline Phosphatase: 92 IU/L (ref 44–121)
BUN/Creatinine Ratio: 19 (ref 12–28)
BUN: 16 mg/dL (ref 8–27)
Bilirubin Total: 0.6 mg/dL (ref 0.0–1.2)
CO2: 24 mmol/L (ref 20–29)
Calcium: 9.8 mg/dL (ref 8.7–10.3)
Chloride: 105 mmol/L (ref 96–106)
Creatinine, Ser: 0.85 mg/dL (ref 0.57–1.00)
Globulin, Total: 2.5 g/dL (ref 1.5–4.5)
Glucose: 86 mg/dL (ref 70–99)
Potassium: 4.3 mmol/L (ref 3.5–5.2)
Sodium: 142 mmol/L (ref 134–144)
Total Protein: 7 g/dL (ref 6.0–8.5)
eGFR: 75 mL/min/{1.73_m2} (ref >59)

## 2021-07-16 LAB — LIPID PANEL
Chol/HDL Ratio: 3.3 ratio (ref 0.0–4.4)
Cholesterol, Total: 226 mg/dL — ABNORMAL HIGH (ref 100–199)
HDL: 68 mg/dL (ref >39)
LDL Chol Calc (NIH): 141 mg/dL — ABNORMAL HIGH (ref 0–99)
Triglycerides: 97 mg/dL (ref 0–149)
VLDL Cholesterol Cal: 17 mg/dL (ref 5–40)

## 2021-07-16 LAB — TSH: TSH: 2.38 u[IU]/mL (ref 0.450–4.500)

## 2021-07-16 LAB — VITAMIN D 25 HYDROXY (VIT D DEFICIENCY, FRACTURES): Vit D, 25-Hydroxy: 45.9 ng/mL (ref 30.0–100.0)

## 2021-07-16 NOTE — Therapy (Signed)
? ?OUTPATIENT PHYSICAL THERAPY TREATMENT NOTE ? ? ?Patient Name: Natalie [name] ?MRN: [id number] ?DOB:[birth year], 66 y.o., female ?Today's Date: 07/16/2021 ? ?PCP: Glean Hess, MD ?REFERRING PROVIDER: Margaretha Sheffield, MD ? ?END OF SESSION:  ? PT End of Session - 07/16/21 1402   ? ? Visit Number 2   ? Number of Visits 9   ? Date for PT Re-Evaluation 09/03/21   ? Authorization Type Eval performed on 07/09/21   ? PT Start Time 9678   ? PT Stop Time 1440   ? PT Time Calculation (min) 41 min   ? Equipment Utilized During Treatment Gait belt   ? Activity Tolerance Patient tolerated treatment well   ? Behavior During Therapy [facility] for tasks assessed/performed   ? ?  ?  ? ?  ? ? ?Past Medical History:  ?Diagnosis Date  ? Benign neoplasm of sigmoid colon   ? Chronic constipation   ? Chronic sinusitis   ? Cystitis   ? Diverticulitis   ? Perforated bowel ([place])   ? Pleural effusion on right 2016  ? Preventative health care 05/24/2015  ? ?Past Surgical History:  ?Procedure Laterality Date  ? BREAST BIOPSY Right 03/15/2019  ? stereo biopsy/ x clip/ benign  ? COLONOSCOPY WITH PROPOFOL N/A 07/27/2015  ? Procedure: COLONOSCOPY WITH PROPOFOL;  Surgeon: Lucilla Lame, MD;  Location: [place];  Service: Endoscopy;  Laterality: N/A;  ? COLONOSCOPY WITH PROPOFOL N/A 03/14/2016  ? Procedure: COLONOSCOPY WITH PROPOFOL;  Surgeon: Lucilla Lame, MD;  Location: [place];  Service: Endoscopy;  Laterality: N/A;  Cannot arrive before 0830 AM  ? COLOSTOMY REVERSAL N/A 09/26/2015  ? Procedure: COLOSTOMY REVERSAL / TAKE DOWN;  Surgeon: Hubbard Robinson, MD;  Location: [facility];  Service: General;  Laterality: N/A;  ? LAPAROTOMY N/A 03/01/2015  ? Procedure: EXPLORATORY LAPAROTOMY, SIGMOID COLECTOMY, COLOSTOMY;  Surgeon: Hubbard Robinson, MD;  Location: [facility];  Service: General;  Laterality: N/A;  ? POLYPECTOMY  03/14/2016  ? Procedure: POLYPECTOMY;  Surgeon: Lucilla Lame, MD;  Location: [place];  Service: Endoscopy;;   ? ?Patient Active Problem List  ? Diagnosis Date Noted  ? Dizziness, nonspecific 07/31/2020  ? Periodic limb movement sleep disorder 03/14/2020  ? Osteopenia determined by x-ray 09/22/2019  ? Elevated LDL cholesterol level 07/18/2019  ? Vestibular migraine 11/27/2017  ? Hx of diverticulitis of colon   ? Benign neoplasm of ascending colon   ? History of colostomy reversal   ?ONSET DATE: 2016 after surgery ? ?REFERRING DIAG: R42 dysequilibrium ? ?THERAPY DIAG:  ?Dizziness and giddiness ? ?PERTINENT HISTORY:  ?Patient reports that she began to have dizziness in 2016 after she had a bout of sepsis and underwent surgery where she needed to have a colostomy and a chest tube.  Patient reports after the surgery she began having equilibrium issues.  Patient reports she did receive ototoxic medications such as gentamicin after the surgery.  Patient states she saw Dr. Brigitte Pulse, neurologist, in regards to her dizziness symptoms.  Patient states Dr. Brigitte Pulse felt that she did not fit the classic category for vestibular migraines.  Then, patient had a work-up for M?ni?re's disease which Dr. Freddy Jaksch been felt she did not have per patient report.  Patient reports that he said she had visual perception difficulties.  Patient saw Dr. Kathyrn Sheriff, ENT physician, and patient reports that Dr. Kathyrn Sheriff felt that vestibular therapy might help strengthen and help her if it progressed down the road. Per Dr. Maisie Fus medical record,  patient has diagnosis of disequilibrium, dizziness and giddiness, M?ni?re's disease, and active cochleovestibular disorder.  On 06/07/2021 patient had VNG testing per medical record that revealed a 42% right unilateral weakness and abnormal smooth pursuits as well as abnormal saccades.  Patient reports that Dr. Kathyrn Sheriff thought it was central nervous system and not M?ni?re's disease. Patient reports that the VNG testing was intense and that it reproduced her symptoms but that since the VNG testing her symptoms have been a  little better overall. Patient describes her dizziness symptoms as a constant floating and disequilibrium sensation, "staggery", and vertigo.  Patient states she has had occasional distinct episodes of vertigo.  Patient reports the last episode of vertigo was in December 2022.  Pt reports staying still and closing her eyes it will subside in 15 minutes but if she moves it starts right back up and this lasts about a day. And then she gets increase dysequilibrium feeling for about a week until it drops back down to her baseline level of floaty feeling. Patient reports she has about 3-4 episodes of vertigo per year.  Patient reports that sometimes changes in the seasons and weather such as when a storm system is coming in that she will notice this bringing on her symptoms.  Patient states she was doing some home renovation and staining of room and noticed that this brought on symptoms.  Patient states she veers when she walks at times.  Patient reports that she has had several sinus infections and when they were treated this helped to decrease her symptoms some as well.  Patient has tried meclizine and Valium but states they did not help her dizziness symptoms and that meclizine makes her sleepy.  She reports driving is very challenging and states she has difficulty with peripheral things going by affecting her vision when in the car.  Patient states she had an episode while driving of dizziness where she got tunnel vision once and that she now limits her driving. Patient reports that driving also causes her fatigue.   Patient reports that she has conditioned herself to live differently and that she tries to avoid making sudden movements and positions where she might get unsteady.  Patient reports she walks 2 miles a day with her walking stick. ? ?SYMPTOM BEHAVIOR: ? ?Type of dizziness: Imbalance (Disequilibrium), Spinning/Vertigo, and Unsteady with head/body turns ?Frequency: daily sensation of disequilibrium and  punctuated episodes 3-4 times a year of vertigo ?Duration: baseline disequilibrium sensation all the time ?Aggravating factors: Induced by motion: bending down to the ground, turning body quickly, turning head quickly, and driving, Worse in the dark, Worse outside or in busy environment, and stress  ?Relieving factors: lying supine, closing eyes, rest, slow movements, and avoid busy/distracting environments ?Progression of symptoms: better because patient reports she has learned to adapt and avoids certain movements and positions; Reports has been a little better since having had the VNG ?History of similar episodes: none prior to 2016 ? ?Auditory complaints (tinnitus, pain, drainage): "crickets" "noise" at times in both ears; denies hearing loss ?Vision (last eye exam, diplopia, recent changes): wears eye glasses, got new glasses this year and had last visit in December.  ?She has noticed depth perception issues. ?Current Symptoms: (dysarthria, dysphagia, drop attacks, bowel and bladder changes, recent weight loss/gain)    Review of systems negative for red flags.   ?   ?DIAGNOSTIC FINDINGS: per medical record, VNG revealed 43% right unilateral hypofunction ? ?PRECAUTIONS: fall ? ?SUBJECTIVE: Patient states that she has  been doing the VOR X 1 exercise at home and that it does increase her dizziness mildly after she has done a set.  ?She is interested in working on depth perception and bending.  ? ?PAIN:  ?Are you having pain?  None stated ? ?PATIENT GOALS: driving, stronger, less episodes and intensity of vertigo and dysequillibrium. ? ?OBJECTIVE:  ? ?VESTIBULAR TREATMENT: ? ?07/16/2021: ? ?VOR X 1 exercise:  ?Patient performed VOR X 1 horizontal in standing 2 repetitions of 1 minute each.  Patient demonstrating good technique. ?Issued progression of standing VOR for home exercise program. ? ?Airex pad:  ?On firm surface and then on Airex pad, patient performed feet together and semi-tandem progressions with  alternating lead leg with and without body turns and horizontal and vertical head turns with contact-guard assistance. ?Patient denies any increase in dizziness but described unsteadiness feeling and patient's bala

## 2021-07-16 NOTE — Telephone Encounter (Signed)
CALLED PATIENT NO ANSWER LEFT VOICEMAIL FOR A CALL BACK ? ?

## 2021-07-17 ENCOUNTER — Telehealth: Payer: Self-pay

## 2021-07-17 NOTE — Telephone Encounter (Signed)
Scheduled inperson patient has a lot of concerns and questions and wanted to be seen ?

## 2021-07-23 ENCOUNTER — Ambulatory Visit: Payer: No Typology Code available for payment source | Admitting: Physical Therapy

## 2021-07-23 ENCOUNTER — Encounter: Payer: Self-pay | Admitting: Physical Therapy

## 2021-07-23 VITALS — BP 104/63

## 2021-07-23 DIAGNOSIS — R42 Dizziness and giddiness: Secondary | ICD-10-CM | POA: Diagnosis not present
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
## 2021-08-01 ENCOUNTER — Telehealth: Payer: Self-pay

## 2021-08-01 NOTE — Telephone Encounter (Signed)
Called pt left VM as a reminder to call and schedule mammogram and bone density. [phone] ? ?KP ?

## 2021-08-02 ENCOUNTER — Encounter: Payer: Self-pay | Admitting: Physical Therapy

## 2021-08-02 ENCOUNTER — Ambulatory Visit: Payer: No Typology Code available for payment source | Admitting: Physical Therapy

## 2021-08-02 VITALS — BP 105/72

## 2021-08-02 DIAGNOSIS — R42 Dizziness and giddiness: Secondary | ICD-10-CM

## 2021-08-02 NOTE — Therapy (Signed)
? ?OUTPATIENT PHYSICAL THERAPY TREATMENT NOTE ? ? ?Patient Name: Natalie [name] ?MRN: [id number] ?DOB:February 02, 1956, 66 y.o., female ?Today's Date: 08/02/2021 ? ?PCP: Glean Hess, MD ?REFERRING PROVIDER: Margaretha Sheffield, MD ? ?END OF SESSION:  ? PT End of Session - 08/02/21 1105   ? ? Visit Number 4   ? Number of Visits 9   ? Date for PT Re-Evaluation 09/03/21   ? Authorization Type Eval performed on 07/09/21   ? PT Start Time 1104   ? PT Stop Time 1150   ? PT Time Calculation (min) 46 min   ? Equipment Utilized During Treatment Gait belt   ? Activity Tolerance Patient tolerated treatment well   ? Behavior During Therapy [facility] for tasks assessed/performed   ? ?  ?  ? ?  ? ? ? ?Past Medical History:  ?Diagnosis Date  ? Benign neoplasm of sigmoid colon   ? Chronic constipation   ? Chronic sinusitis   ? Cystitis   ? Diverticulitis   ? Perforated bowel ([place])   ? Pleural effusion on right 2016  ? Preventative health care 05/24/2015  ? ?Past Surgical History:  ?Procedure Laterality Date  ? BREAST BIOPSY Right 03/15/2019  ? stereo biopsy/ x clip/ benign  ? COLONOSCOPY WITH PROPOFOL N/A 07/27/2015  ? Procedure: COLONOSCOPY WITH PROPOFOL;  Surgeon: Lucilla Lame, MD;  Location: [place];  Service: Endoscopy;  Laterality: N/A;  ? COLONOSCOPY WITH PROPOFOL N/A 03/14/2016  ? Procedure: COLONOSCOPY WITH PROPOFOL;  Surgeon: Lucilla Lame, MD;  Location: [place];  Service: Endoscopy;  Laterality: N/A;  Cannot arrive before 0830 AM  ? COLOSTOMY REVERSAL N/A 09/26/2015  ? Procedure: COLOSTOMY REVERSAL / TAKE DOWN;  Surgeon: Hubbard Robinson, MD;  Location: [facility];  Service: General;  Laterality: N/A;  ? LAPAROTOMY N/A 03/01/2015  ? Procedure: EXPLORATORY LAPAROTOMY, SIGMOID COLECTOMY, COLOSTOMY;  Surgeon: Hubbard Robinson, MD;  Location: [facility];  Service: General;  Laterality: N/A;  ? POLYPECTOMY  03/14/2016  ? Procedure: POLYPECTOMY;  Surgeon: Lucilla Lame, MD;  Location: [place];  Service:  Endoscopy;;  ? ?Patient Active Problem List  ? Diagnosis Date Noted  ? Dizziness, nonspecific 07/31/2020  ? Periodic limb movement sleep disorder 03/14/2020  ? Osteopenia determined by x-ray 09/22/2019  ? Elevated LDL cholesterol level 07/18/2019  ? Vestibular migraine 11/27/2017  ? Hx of diverticulitis of colon   ? Benign neoplasm of ascending colon   ? History of colostomy reversal   ?ONSET DATE: 2016 after surgery ? ?REFERRING DIAG: R42 dysequilibrium ? ?THERAPY DIAG:  ?Dizziness and giddiness ? ?PERTINENT HISTORY:  ?Patient reports that she began to have dizziness in 2016 after she had a bout of sepsis and underwent surgery where she needed to have a colostomy and a chest tube.  Patient reports after the surgery she began having equilibrium issues.  Patient reports she did receive ototoxic medications such as gentamicin after the surgery.  Patient states she saw Dr. Brigitte Pulse, neurologist, in regards to her dizziness symptoms.  Patient states Dr. Brigitte Pulse felt that she did not fit the classic category for vestibular migraines.  Then, patient had a work-up for M?ni?re's disease which Dr. Freddy Jaksch been felt she did not have per patient report.  Patient reports that he said she had visual perception difficulties.  Patient saw Dr. Kathyrn Sheriff, ENT physician, and patient reports that Dr. Kathyrn Sheriff felt that vestibular therapy might help strengthen and help her if it progressed down the road. Per Dr. Maisie Fus medical  record, patient has diagnosis of disequilibrium, dizziness and giddiness, M?ni?re's disease, and active cochleovestibular disorder.  On 06/07/2021 patient had VNG testing per medical record that revealed a 42% right unilateral weakness and abnormal smooth pursuits as well as abnormal saccades.  Patient reports that Dr. Kathyrn Sheriff thought it was central nervous system and not M?ni?re's disease. Patient reports that the VNG testing was intense and that it reproduced her symptoms but that since the VNG testing her symptoms  have been a little better overall. Patient describes her dizziness symptoms as a constant floating and disequilibrium sensation, "staggery", and vertigo.  Patient states she has had occasional distinct episodes of vertigo.  Patient reports the last episode of vertigo was in December 2022.  Pt reports staying still and closing her eyes it will subside in 15 minutes but if she moves it starts right back up and this lasts about a day. And then she gets increase dysequilibrium feeling for about a week until it drops back down to her baseline level of floaty feeling. Patient reports she has about 3-4 episodes of vertigo per year.  Patient reports that sometimes changes in the seasons and weather such as when a storm system is coming in that she will notice this bringing on her symptoms.  Patient states she was doing some home renovation and staining of room and noticed that this brought on symptoms.  Patient states she veers when she walks at times.  Patient reports that she has had several sinus infections and when they were treated this helped to decrease her symptoms some as well.  Patient has tried meclizine and Valium but states they did not help her dizziness symptoms and that meclizine makes her sleepy.  She reports driving is very challenging and states she has difficulty with peripheral things going by affecting her vision when in the car.  Patient states she had an episode while driving of dizziness where she got tunnel vision once and that she now limits her driving. Patient reports that driving also causes her fatigue.   Patient reports that she has conditioned herself to live differently and that she tries to avoid making sudden movements and positions where she might get unsteady.  Patient reports she walks 2 miles a day with her walking stick. ? ?SYMPTOM BEHAVIOR: ? ?Type of dizziness: Imbalance (Disequilibrium), Spinning/Vertigo, and Unsteady with head/body turns ?Frequency: daily sensation of  disequilibrium and punctuated episodes 3-4 times a year of vertigo ?Duration: baseline disequilibrium sensation all the time ?Aggravating factors: Induced by motion: bending down to the ground, turning body quickly, turning head quickly, and driving, Worse in the dark, Worse outside or in busy environment, and stress  ?Relieving factors: lying supine, closing eyes, rest, slow movements, and avoid busy/distracting environments ?Progression of symptoms: better because patient reports she has learned to adapt and avoids certain movements and positions; Reports has been a little better since having had the VNG ?History of similar episodes: none prior to 2016 ? ?Auditory complaints (tinnitus, pain, drainage): "crickets" "noise" at times in both ears; denies hearing loss ?Vision (last eye exam, diplopia, recent changes): wears eye glasses, got new glasses this year and had last visit in December.  ?She has noticed depth perception issues. ?Current Symptoms: (dysarthria, dysphagia, drop attacks, bowel and bladder changes, recent weight loss/gain)    Review of systems negative for red flags.   ?   ?DIAGNOSTIC FINDINGS: per medical record, VNG revealed 43% right unilateral hypofunction ? ?PRECAUTIONS: fall ? ?SUBJECTIVE:  ?Pt states the  rain makes her dizzier. Pt states that she did better towards the end of last week but when the weather changed she started getting dizziness. Pt states that she drove to the church about 15 minute drive on Sunday. Pt states her exercises especially the body wall rolls bring on her dizziness. Pt states she feels her dizziness symptoms have been some better and that she noticed an improvement in being able to go to the grocery store.  ? ?PAIN:  ?Are you having pain?  None stated    states she has some stiffness in her neck when it gets rainy but states it is not hurting now.  ? ?PATIENT GOALS: driving, stronger, less episodes and intensity of vertigo and dysequillibrium. ? ?OBJECTIVE:   ? ?VESTIBULAR TREATMENT: ? ? ?08/02/2021: ? ?VOR x 2 exercise:  ?Demonstrated and educated as to VOR X 2. ?Patient performed VOR X 2 horiz in sitting 3 reps of 1 minute each with verbal cues for technique.  ?Patient reports 2/10 diz

## 2021-08-05 NOTE — Therapy (Incomplete)
? ?OUTPATIENT PHYSICAL THERAPY TREATMENT NOTE ? ? ?Patient Name: Natalie [name] ?MRN: [id number] ?DOB:[birth year], 66 y.o., female ?Today's Date: 08/05/2021 ? ?PCP: Glean Hess, MD ?REFERRING PROVIDER: Margaretha Sheffield, MD ? ?END OF SESSION:  ? ? ? ? ?Past Medical History:  ?Diagnosis Date  ? Benign neoplasm of sigmoid colon   ? Chronic constipation   ? Chronic sinusitis   ? Cystitis   ? Diverticulitis   ? Perforated bowel ([place])   ? Pleural effusion on right 2016  ? Preventative health care 05/24/2015  ? ?Past Surgical History:  ?Procedure Laterality Date  ? BREAST BIOPSY Right 03/15/2019  ? stereo biopsy/ x clip/ benign  ? COLONOSCOPY WITH PROPOFOL N/A 07/27/2015  ? Procedure: COLONOSCOPY WITH PROPOFOL;  Surgeon: Lucilla Lame, MD;  Location: [place];  Service: Endoscopy;  Laterality: N/A;  ? COLONOSCOPY WITH PROPOFOL N/A 03/14/2016  ? Procedure: COLONOSCOPY WITH PROPOFOL;  Surgeon: Lucilla Lame, MD;  Location: [place];  Service: Endoscopy;  Laterality: N/A;  Cannot arrive before 0830 AM  ? COLOSTOMY REVERSAL N/A 09/26/2015  ? Procedure: COLOSTOMY REVERSAL / TAKE DOWN;  Surgeon: Hubbard Robinson, MD;  Location: [facility];  Service: General;  Laterality: N/A;  ? LAPAROTOMY N/A 03/01/2015  ? Procedure: EXPLORATORY LAPAROTOMY, SIGMOID COLECTOMY, COLOSTOMY;  Surgeon: Hubbard Robinson, MD;  Location: [facility];  Service: General;  Laterality: N/A;  ? POLYPECTOMY  03/14/2016  ? Procedure: POLYPECTOMY;  Surgeon: Lucilla Lame, MD;  Location: [place];  Service: Endoscopy;;  ? ?Patient Active Problem List  ? Diagnosis Date Noted  ? Dizziness, nonspecific 07/31/2020  ? Periodic limb movement sleep disorder 03/14/2020  ? Osteopenia determined by x-ray 09/22/2019  ? Elevated LDL cholesterol level 07/18/2019  ? Vestibular migraine 11/27/2017  ? Hx of diverticulitis of colon   ? Benign neoplasm of ascending colon   ? History of colostomy reversal   ?ONSET DATE: 2016 after surgery ? ?REFERRING DIAG:  R42 dysequilibrium ? ?THERAPY DIAG:  ?No diagnosis found. ? ?PERTINENT HISTORY:  ?Patient reports that she began to have dizziness in 2016 after she had a bout of sepsis and underwent surgery where she needed to have a colostomy and a chest tube.  Patient reports after the surgery she began having equilibrium issues.  Patient reports she did receive ototoxic medications such as gentamicin after the surgery.  Patient states she saw Dr. Brigitte Pulse, neurologist, in regards to her dizziness symptoms.  Patient states Dr. Brigitte Pulse felt that she did not fit the classic category for vestibular migraines.  Then, patient had a work-up for M?ni?re's disease which Dr. Freddy Jaksch been felt she did not have per patient report.  Patient reports that he said she had visual perception difficulties.  Patient saw Dr. Kathyrn Sheriff, ENT physician, and patient reports that Dr. Kathyrn Sheriff felt that vestibular therapy might help strengthen and help her if it progressed down the road. Per Dr. Maisie Fus medical record, patient has diagnosis of disequilibrium, dizziness and giddiness, M?ni?re's disease, and active cochleovestibular disorder.  On 06/07/2021 patient had VNG testing per medical record that revealed a 42% right unilateral weakness and abnormal smooth pursuits as well as abnormal saccades.  Patient reports that Dr. Kathyrn Sheriff thought it was central nervous system and not M?ni?re's disease. Patient reports that the VNG testing was intense and that it reproduced her symptoms but that since the VNG testing her symptoms have been a little better overall. Patient describes her dizziness symptoms as a constant floating and disequilibrium sensation, "staggery", and  vertigo.  Patient states she has had occasional distinct episodes of vertigo.  Patient reports the last episode of vertigo was in December 2022.  Pt reports staying still and closing her eyes it will subside in 15 minutes but if she moves it starts right back up and this lasts about a day. And then  she gets increase dysequilibrium feeling for about a week until it drops back down to her baseline level of floaty feeling. Patient reports she has about 3-4 episodes of vertigo per year.  Patient reports that sometimes changes in the seasons and weather such as when a storm system is coming in that she will notice this bringing on her symptoms.  Patient states she was doing some home renovation and staining of room and noticed that this brought on symptoms.  Patient states she veers when she walks at times.  Patient reports that she has had several sinus infections and when they were treated this helped to decrease her symptoms some as well.  Patient has tried meclizine and Valium but states they did not help her dizziness symptoms and that meclizine makes her sleepy.  She reports driving is very challenging and states she has difficulty with peripheral things going by affecting her vision when in the car.  Patient states she had an episode while driving of dizziness where she got tunnel vision once and that she now limits her driving. Patient reports that driving also causes her fatigue.   Patient reports that she has conditioned herself to live differently and that she tries to avoid making sudden movements and positions where she might get unsteady.  Patient reports she walks 2 miles a day with her walking stick. ? ?SYMPTOM BEHAVIOR: ? ?Type of dizziness: Imbalance (Disequilibrium), Spinning/Vertigo, and Unsteady with head/body turns ?Frequency: daily sensation of disequilibrium and punctuated episodes 3-4 times a year of vertigo ?Duration: baseline disequilibrium sensation all the time ?Aggravating factors: Induced by motion: bending down to the ground, turning body quickly, turning head quickly, and driving, Worse in the dark, Worse outside or in busy environment, and stress  ?Relieving factors: lying supine, closing eyes, rest, slow movements, and avoid busy/distracting environments ?Progression of symptoms:  better because patient reports she has learned to adapt and avoids certain movements and positions; Reports has been a little better since having had the VNG ?History of similar episodes: none prior to 2016 ? ?Auditory complaints (tinnitus, pain, drainage): "crickets" "noise" at times in both ears; denies hearing loss ?Vision (last eye exam, diplopia, recent changes): wears eye glasses, got new glasses this year and had last visit in December.  ?She has noticed depth perception issues. ?Current Symptoms: (dysarthria, dysphagia, drop attacks, bowel and bladder changes, recent weight loss/gain)    Review of systems negative for red flags.   ?   ?DIAGNOSTIC FINDINGS: per medical record, VNG revealed 43% right unilateral hypofunction ? ?PRECAUTIONS: fall ? ?SUBJECTIVE:  ? ?*** ?Pt states the rain makes her dizzier. Pt states that she did better towards the end of last week but when the weather changed she started getting dizziness. Pt states that she drove to the church about 15 minute drive on Sunday. Pt states her exercises especially the body wall rolls bring on her dizziness. Pt states she feels her dizziness symptoms have been some better and that she noticed an improvement in being able to go to the grocery store.  ? ?PAIN:  ?Are you having pain?  None stated    states she has some stiffness  in her neck when it gets rainy but states it is not hurting now.  ? ?PATIENT GOALS: driving, stronger, less episodes and intensity of vertigo and dysequillibrium. ? ?OBJECTIVE:  ? ?VESTIBULAR TREATMENT: ? ?08/05/2021: ?Review VOR X 2, depth perception, ball toss shoulder fwd/retro and then varying height ? ? ? ?08/02/2021: ?VOR x 2 exercise:  ?Demonstrated and educated as to VOR X 2. ?Patient performed VOR X 2 horiz in sitting 3 reps of 1 minute each with verbal cues for technique.  ?Patient reports 2/10 dizziness. ?Added this exercise to home exercise program.  ?Discussed to start in sitting and if pt is able to do without it  recreating dizziness at home, then can progress to standing in front of plain wall.  ? ? ?Cone Pick up: ?Pt worked on ambulation with turning to bend over to pick up cones on floor in straight line and th

## 2021-08-06 ENCOUNTER — Encounter: Payer: Self-pay | Admitting: Physical Therapy

## 2021-08-06 ENCOUNTER — Ambulatory Visit: Payer: No Typology Code available for payment source | Attending: Otolaryngology | Admitting: Physical Therapy

## 2021-08-06 DIAGNOSIS — R42 Dizziness and giddiness: Secondary | ICD-10-CM | POA: Insufficient documentation

## 2021-08-06 NOTE — Therapy (Signed)
? ?OUTPATIENT PHYSICAL THERAPY TREATMENT NOTE ? ? ?Patient Name: Natalie [name] ?MRN: [id number] ?DOB:Aug 05, 1955, 66 y.o., female ?Today's Date: 08/06/2021 ? ?PCP: Glean Hess, MD ?REFERRING PROVIDER: Margaretha Sheffield, MD ? ?END OF SESSION:  ? PT End of Session - 08/06/21 1259   ? ? Visit Number 5   ? Number of Visits 9   ? Date for PT Re-Evaluation 09/03/21   ? Authorization Type Eval performed on 07/09/21   ? PT Start Time 1301   ? PT Stop Time 1348   ? PT Time Calculation (min) 47 min   ? Equipment Utilized During Treatment Gait belt   ? Activity Tolerance Patient tolerated treatment well   ? Behavior During Therapy [facility] for tasks assessed/performed   ? ?  ?  ? ?  ? ? ?Past Medical History:  ?Diagnosis Date  ? Benign neoplasm of sigmoid colon   ? Chronic constipation   ? Chronic sinusitis   ? Cystitis   ? Diverticulitis   ? Perforated bowel ([place])   ? Pleural effusion on right 2016  ? Preventative health care 05/24/2015  ? ?Past Surgical History:  ?Procedure Laterality Date  ? BREAST BIOPSY Right 03/15/2019  ? stereo biopsy/ x clip/ benign  ? COLONOSCOPY WITH PROPOFOL N/A 07/27/2015  ? Procedure: COLONOSCOPY WITH PROPOFOL;  Surgeon: Lucilla Lame, MD;  Location: [place];  Service: Endoscopy;  Laterality: N/A;  ? COLONOSCOPY WITH PROPOFOL N/A 03/14/2016  ? Procedure: COLONOSCOPY WITH PROPOFOL;  Surgeon: Lucilla Lame, MD;  Location: [place];  Service: Endoscopy;  Laterality: N/A;  Cannot arrive before 0830 AM  ? COLOSTOMY REVERSAL N/A 09/26/2015  ? Procedure: COLOSTOMY REVERSAL / TAKE DOWN;  Surgeon: Hubbard Robinson, MD;  Location: [facility];  Service: General;  Laterality: N/A;  ? LAPAROTOMY N/A 03/01/2015  ? Procedure: EXPLORATORY LAPAROTOMY, SIGMOID COLECTOMY, COLOSTOMY;  Surgeon: Hubbard Robinson, MD;  Location: [facility];  Service: General;  Laterality: N/A;  ? POLYPECTOMY  03/14/2016  ? Procedure: POLYPECTOMY;  Surgeon: Lucilla Lame, MD;  Location: [place];  Service: Endoscopy;;   ? ?Patient Active Problem List  ? Diagnosis Date Noted  ? Dizziness, nonspecific 07/31/2020  ? Periodic limb movement sleep disorder 03/14/2020  ? Osteopenia determined by x-ray 09/22/2019  ? Elevated LDL cholesterol level 07/18/2019  ? Vestibular migraine 11/27/2017  ? Hx of diverticulitis of colon   ? Benign neoplasm of ascending colon   ? History of colostomy reversal   ?ONSET DATE: 2016 after surgery ? ?REFERRING DIAG: R42 dysequilibrium ? ?THERAPY DIAG:  ?Dizziness and giddiness ? ?PERTINENT HISTORY:  ?Patient reports that she began to have dizziness in 2016 after she had a bout of sepsis and underwent surgery where she needed to have a colostomy and a chest tube.  Patient reports after the surgery she began having equilibrium issues.  Patient reports she did receive ototoxic medications such as gentamicin after the surgery.  Patient states she saw Dr. Brigitte Pulse, neurologist, in regards to her dizziness symptoms.  Patient states Dr. Brigitte Pulse felt that she did not fit the classic category for vestibular migraines.  Then, patient had a work-up for M?ni?re's disease which Dr. Freddy Jaksch been felt she did not have per patient report.  Patient reports that he said she had visual perception difficulties.  Patient saw Dr. Kathyrn Sheriff, ENT physician, and patient reports that Dr. Kathyrn Sheriff felt that vestibular therapy might help strengthen and help her if it progressed down the road. Per Dr. Maisie Fus medical record,  patient has diagnosis of disequilibrium, dizziness and giddiness, M?ni?re's disease, and active cochleovestibular disorder.  On 06/07/2021 patient had VNG testing per medical record that revealed a 42% right unilateral weakness and abnormal smooth pursuits as well as abnormal saccades.  Patient reports that Dr. Kathyrn Sheriff thought it was central nervous system and not M?ni?re's disease. Patient reports that the VNG testing was intense and that it reproduced her symptoms but that since the VNG testing her symptoms have been a  little better overall. Patient describes her dizziness symptoms as a constant floating and disequilibrium sensation, "staggery", and vertigo.  Patient states she has had occasional distinct episodes of vertigo.  Patient reports the last episode of vertigo was in December 2022.  Pt reports staying still and closing her eyes it will subside in 15 minutes but if she moves it starts right back up and this lasts about a day. And then she gets increase dysequilibrium feeling for about a week until it drops back down to her baseline level of floaty feeling. Patient reports she has about 3-4 episodes of vertigo per year.  Patient reports that sometimes changes in the seasons and weather such as when a storm system is coming in that she will notice this bringing on her symptoms.  Patient states she was doing some home renovation and staining of room and noticed that this brought on symptoms.  Patient states she veers when she walks at times.  Patient reports that she has had several sinus infections and when they were treated this helped to decrease her symptoms some as well.  Patient has tried meclizine and Valium but states they did not help her dizziness symptoms and that meclizine makes her sleepy.  She reports driving is very challenging and states she has difficulty with peripheral things going by affecting her vision when in the car.  Patient states she had an episode while driving of dizziness where she got tunnel vision once and that she now limits her driving. Patient reports that driving also causes her fatigue.   Patient reports that she has conditioned herself to live differently and that she tries to avoid making sudden movements and positions where she might get unsteady.  Patient reports she walks 2 miles a day with her walking stick. ? ?SYMPTOM BEHAVIOR: ? ?Type of dizziness: Imbalance (Disequilibrium), Spinning/Vertigo, and Unsteady with head/body turns ?Frequency: daily sensation of disequilibrium and  punctuated episodes 3-4 times a year of vertigo ?Duration: baseline disequilibrium sensation all the time ?Aggravating factors: Induced by motion: bending down to the ground, turning body quickly, turning head quickly, and driving, Worse in the dark, Worse outside or in busy environment, and stress  ?Relieving factors: lying supine, closing eyes, rest, slow movements, and avoid busy/distracting environments ?Progression of symptoms: better because patient reports she has learned to adapt and avoids certain movements and positions; Reports has been a little better since having had the VNG ?History of similar episodes: none prior to 2016 ? ?Auditory complaints (tinnitus, pain, drainage): "crickets" "noise" at times in both ears; denies hearing loss ?Vision (last eye exam, diplopia, recent changes): wears eye glasses, got new glasses this year and had last visit in December.  ?She has noticed depth perception issues. ?Current Symptoms: (dysarthria, dysphagia, drop attacks, bowel and bladder changes, recent weight loss/gain)    Review of systems negative for red flags.   ?   ?DIAGNOSTIC FINDINGS: per medical record, VNG revealed 43% right unilateral hypofunction ? ?PRECAUTIONS: fall ? ?SUBJECTIVE:  ? ?Pt states she  drove yesterday and today. Pt states that she is getting her confidence back and states she had one episode of feeling transient, mild dizziness when she stopped the car. Pt reports that things going by when she is in the car does not bother her now and she states that is much better.  ? ?PAIN:  ?Are you having pain?  None stated    states she has some stiffness in her neck when it gets rainy but states it is not hurting now.  ? ?PATIENT GOALS: driving, stronger, less episodes and intensity of vertigo and dysequillibrium. ? ?OBJECTIVE:  ? ?VESTIBULAR TREATMENT: ? ?08/06/2021: ? ?Pencil Push-up exercises: ?Demonstrated and discussed Pencil push-up exercise. ?Worked on pencil push-up exercises multiple reps with  verbal cuing and covering one eye and then the other in order to promote both eyes focusing and staying on the target.   ?Issued this exercise for home exercise program with handout provided via [employer]. ?

## 2021-08-13 ENCOUNTER — Ambulatory Visit: Payer: No Typology Code available for payment source | Admitting: Physical Therapy

## 2021-08-13 DIAGNOSIS — R42 Dizziness and giddiness: Secondary | ICD-10-CM

## 2021-08-13 NOTE — Therapy (Signed)
? ?OUTPATIENT PHYSICAL THERAPY TREATMENT NOTE ? ? ?Patient Name: Natalie [name] ?MRN: [id number] ?DOB:[birth year], 65 y.o., female ?Today's Date: 08/13/2021 ? ?PCP: Glean Hess, MD ?REFERRING PROVIDER: Margaretha Sheffield, MD ? ?END OF SESSION:  ? PT End of Session - 08/13/21 1309   ? ? Visit Number 6   ? Number of Visits 9   ? Date for PT Re-Evaluation 09/03/21   ? Authorization Type Eval performed on 07/09/21   ? PT Start Time 1309   ? Equipment Utilized During Treatment Gait belt   ? Activity Tolerance Patient tolerated treatment well   ? Behavior During Therapy  [facility] for tasks assessed/performed   ? ?  ?  ? ?  ? ? ?Past Medical History:  ?Diagnosis Date  ? Benign neoplasm of sigmoid colon   ? Chronic constipation   ? Chronic sinusitis   ? Cystitis   ? Diverticulitis   ? Perforated bowel ([place])   ? Pleural effusion on right 2016  ? Preventative health care 05/24/2015  ? ?Past Surgical History:  ?Procedure Laterality Date  ? BREAST BIOPSY Right 03/15/2019  ? stereo biopsy/ x clip/ benign  ? COLONOSCOPY WITH PROPOFOL N/A 07/27/2015  ? Procedure: COLONOSCOPY WITH PROPOFOL;  Surgeon: Lucilla Lame, MD;  Location: [place];  Service: Endoscopy;  Laterality: N/A;  ? COLONOSCOPY WITH PROPOFOL N/A 03/14/2016  ? Procedure: COLONOSCOPY WITH PROPOFOL;  Surgeon: Lucilla Lame, MD;  Location: [place];  Service: Endoscopy;  Laterality: N/A;  Cannot arrive before 0830 AM  ? COLOSTOMY REVERSAL N/A 09/26/2015  ? Procedure: COLOSTOMY REVERSAL / TAKE DOWN;  Surgeon: Hubbard Robinson, MD;  Location: [facility];  Service: General;  Laterality: N/A;  ? LAPAROTOMY N/A 03/01/2015  ? Procedure: EXPLORATORY LAPAROTOMY, SIGMOID COLECTOMY, COLOSTOMY;  Surgeon: Hubbard Robinson, MD;  Location: [facility];  Service: General;  Laterality: N/A;  ? POLYPECTOMY  03/14/2016  ? Procedure: POLYPECTOMY;  Surgeon: Lucilla Lame, MD;  Location: [place];  Service: Endoscopy;;  ? ?Patient Active Problem List  ? Diagnosis Date Noted  ?  Dizziness, nonspecific 07/31/2020  ? Periodic limb movement sleep disorder 03/14/2020  ? Osteopenia determined by x-ray 09/22/2019  ? Elevated LDL cholesterol level 07/18/2019  ? Vestibular migraine 11/27/2017  ? Hx of diverticulitis of colon   ? Benign neoplasm of ascending colon   ? History of colostomy reversal   ?ONSET DATE: 2016 after surgery ? ?REFERRING DIAG: R42 dysequilibrium ? ?THERAPY DIAG:  ?Dizziness and giddiness ? ?PERTINENT HISTORY:  ?Patient reports that she began to have dizziness in 2016 after she had a bout of sepsis and underwent surgery where she needed to have a colostomy and a chest tube.  Patient reports after the surgery she began having equilibrium issues.  Patient reports she did receive ototoxic medications such as gentamicin after the surgery.  Patient states she saw Dr. Brigitte Pulse, neurologist, in regards to her dizziness symptoms.  Patient states Dr. Brigitte Pulse felt that she did not fit the classic category for vestibular migraines.  Then, patient had a work-up for M?ni?re's disease which Dr. Freddy Jaksch been felt she did not have per patient report.  Patient reports that he said she had visual perception difficulties.  Patient saw Dr. Kathyrn Sheriff, ENT physician, and patient reports that Dr. Kathyrn Sheriff felt that vestibular therapy might help strengthen and help her if it progressed down the road. Per Dr. Maisie Fus medical record, patient has diagnosis of disequilibrium, dizziness and giddiness, M?ni?re's disease, and active cochleovestibular disorder.  On  06/07/2021 patient had VNG testing per medical record that revealed a 42% right unilateral weakness and abnormal smooth pursuits as well as abnormal saccades.  Patient reports that Dr. Kathyrn Sheriff thought it was central nervous system and not M?ni?re's disease. Patient reports that the VNG testing was intense and that it reproduced her symptoms but that since the VNG testing her symptoms have been a little better overall. Patient describes her dizziness  symptoms as a constant floating and disequilibrium sensation, "staggery", and vertigo.  Patient states she has had occasional distinct episodes of vertigo.  Patient reports the last episode of vertigo was in December 2022.  Pt reports staying still and closing her eyes it will subside in 15 minutes but if she moves it starts right back up and this lasts about a day. And then she gets increase dysequilibrium feeling for about a week until it drops back down to her baseline level of floaty feeling. Patient reports she has about 3-4 episodes of vertigo per year.  Patient reports that sometimes changes in the seasons and weather such as when a storm system is coming in that she will notice this bringing on her symptoms.  Patient states she was doing some home renovation and staining of room and noticed that this brought on symptoms.  Patient states she veers when she walks at times.  Patient reports that she has had several sinus infections and when they were treated this helped to decrease her symptoms some as well.  Patient has tried meclizine and Valium but states they did not help her dizziness symptoms and that meclizine makes her sleepy.  She reports driving is very challenging and states she has difficulty with peripheral things going by affecting her vision when in the car.  Patient states she had an episode while driving of dizziness where she got tunnel vision once and that she now limits her driving. Patient reports that driving also causes her fatigue.   Patient reports that she has conditioned herself to live differently and that she tries to avoid making sudden movements and positions where she might get unsteady.  Patient reports she walks 2 miles a day with her walking stick. ? ?SYMPTOM BEHAVIOR: ? ?Type of dizziness: Imbalance (Disequilibrium), Spinning/Vertigo, and Unsteady with head/body turns ?Frequency: daily sensation of disequilibrium and punctuated episodes 3-4 times a year of vertigo ?Duration:  baseline disequilibrium sensation all the time ?Aggravating factors: Induced by motion: bending down to the ground, turning body quickly, turning head quickly, and driving, Worse in the dark, Worse outside or in busy environment, and stress  ?Relieving factors: lying supine, closing eyes, rest, slow movements, and avoid busy/distracting environments ?Progression of symptoms: better because patient reports she has learned to adapt and avoids certain movements and positions; Reports has been a little better since having had the VNG ?History of similar episodes: none prior to 2016 ? ?Auditory complaints (tinnitus, pain, drainage): "crickets" "noise" at times in both ears; denies hearing loss ?Vision (last eye exam, diplopia, recent changes): wears eye glasses, got new glasses this year and had last visit in December.  ?She has noticed depth perception issues. ?Current Symptoms: (dysarthria, dysphagia, drop attacks, bowel and bladder changes, recent weight loss/gain)    Review of systems negative for red flags.   ?   ?DIAGNOSTIC FINDINGS: per medical record, VNG revealed 43% right unilateral hypofunction ? ?PRECAUTIONS: fall ? ?SUBJECTIVE:  ?Pt states that she drove to therapy today. Pt states that her neck on the left side has been hurting  her and she is seeing her orthopedic doctor tomorrow.  She states it gets better as the day progresses. Pt states that moving her neck seems to make it better and that the home exercise program does not bother her neck and the movement makes it better. Pt reports still having some imbalance and dizziness at times states she does not feel there is a pattern to it. Pt reports "maybe not as aware of it all the time and is more intermittent now". In church when she stands, she holds the bench and states something about that bothers her. 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 Pt  states and the longer it is that she stands, the more ready she is to sit back down due to symptoms.  ? ?PAIN:  ?Are you having pain?  None stated    states she has some stiffness in her neck when it gets rainy

## 2021-08-14 DIAGNOSIS — M50321 Other cervical disc degeneration at C4-C5 level: Secondary | ICD-10-CM | POA: Diagnosis not present

## 2021-08-14 DIAGNOSIS — M47812 Spondylosis without myelopathy or radiculopathy, cervical region: Secondary | ICD-10-CM | POA: Diagnosis not present

## 2021-08-20 ENCOUNTER — Encounter: Payer: Medicare HMO | Admitting: Physical Therapy

## 2021-08-28 ENCOUNTER — Ambulatory Visit: Payer: No Typology Code available for payment source | Admitting: Physical Therapy

## 2021-08-28 DIAGNOSIS — R42 Dizziness and giddiness: Secondary | ICD-10-CM | POA: Diagnosis not present

## 2021-08-28 NOTE — Therapy (Signed)
OUTPATIENT PHYSICAL THERAPY TREATMENT NOTE   Patient Name: Natalie [name] MRN: [id number] DOB:October 09, 1955, 66 y.o., female Today's Date: 08/30/2021  PCP: Glean Hess, MD REFERRING PROVIDER: Margaretha Sheffield, MD  END OF SESSION:   PT End of Session - 08/30/21 1522     Visit Number 7    Number of Visits 9    Date for PT Re-Evaluation 09/03/21    Authorization Type Eval performed on 07/09/21    PT Start Time 1258    PT Stop Time 1346    PT Time Calculation (min) 48 min    Equipment Utilized During Treatment Gait belt    Activity Tolerance Patient tolerated treatment well    Behavior During Therapy [facility] for tasks assessed/performed             Past Medical History:  Diagnosis Date   Benign neoplasm of sigmoid colon    Chronic constipation    Chronic sinusitis    Cystitis    Diverticulitis    Perforated bowel ([place])    Pleural effusion on right 2016   Preventative health care 05/24/2015   Past Surgical History:  Procedure Laterality Date   BREAST BIOPSY Right 03/15/2019   stereo biopsy/ x clip/ benign   COLONOSCOPY WITH PROPOFOL N/A 07/27/2015   Procedure: COLONOSCOPY WITH PROPOFOL;  Surgeon: Lucilla Lame, MD;  Location: [place];  Service: Endoscopy;  Laterality: N/A;   COLONOSCOPY WITH PROPOFOL N/A 03/14/2016   Procedure: COLONOSCOPY WITH PROPOFOL;  Surgeon: Lucilla Lame, MD;  Location: [place];  Service: Endoscopy;  Laterality: N/A;  Cannot arrive before 0830 AM   COLOSTOMY REVERSAL N/A 09/26/2015   Procedure: COLOSTOMY REVERSAL / TAKE DOWN;  Surgeon: Hubbard Robinson, MD;  Location: [facility];  Service: General;  Laterality: N/A;   LAPAROTOMY N/A 03/01/2015   Procedure: EXPLORATORY LAPAROTOMY, SIGMOID COLECTOMY, COLOSTOMY;  Surgeon: Hubbard Robinson, MD;  Location: [facility];  Service: General;  Laterality: N/A;   POLYPECTOMY  03/14/2016   Procedure: POLYPECTOMY;  Surgeon: Lucilla Lame, MD;  Location: [place];  Service: Endoscopy;;    Patient Active Problem List   Diagnosis Date Noted   Dizziness, nonspecific 07/31/2020   Periodic limb movement sleep disorder 03/14/2020   Osteopenia determined by x-ray 09/22/2019   Elevated LDL cholesterol level 07/18/2019   Vestibular migraine 11/27/2017   Hx of diverticulitis of colon    Benign neoplasm of ascending colon    History of colostomy reversal   ONSET DATE: 2016 after surgery  REFERRING DIAG: R42 dysequilibrium  THERAPY DIAG:  Dizziness and giddiness  PERTINENT HISTORY:  Patient reports that she began to have dizziness in 2016 after she had a bout of sepsis and underwent surgery where she needed to have a colostomy and a chest tube.  Patient reports after the surgery she began having equilibrium issues.  Patient reports she did receive ototoxic medications such as gentamicin after the surgery.  Patient states she saw Dr. Brigitte Pulse, neurologist, in regards to her dizziness symptoms.  Patient states Dr. Brigitte Pulse felt that she did not fit the classic category for vestibular migraines.  Then, patient had a work-up for Mnire's disease which Dr. Freddy Jaksch been felt she did not have per patient report.  Patient reports that he said she had visual perception difficulties.  Patient saw Dr. Kathyrn Sheriff, ENT physician, and patient reports that Dr. Kathyrn Sheriff felt that vestibular therapy might help strengthen and help her if it progressed down the road. Per Dr. Maisie Fus medical record,  patient has diagnosis of disequilibrium, dizziness and giddiness, Mnire's disease, and active cochleovestibular disorder.  On 06/07/2021 patient had VNG testing per medical record that revealed a 42% right unilateral weakness and abnormal smooth pursuits as well as abnormal saccades.  Patient reports that Dr. Kathyrn Sheriff thought it was central nervous system and not Mnire's disease. Patient reports that the VNG testing was intense and that it reproduced her symptoms but that since the VNG testing her symptoms have been a  little better overall. Patient describes her dizziness symptoms as a constant floating and disequilibrium sensation, "staggery", and vertigo.  Patient states she has had occasional distinct episodes of vertigo.  Patient reports the last episode of vertigo was in December 2022.  Pt reports staying still and closing her eyes it will subside in 15 minutes but if she moves it starts right back up and this lasts about a day. And then she gets increase dysequilibrium feeling for about a week until it drops back down to her baseline level of floaty feeling. Patient reports she has about 3-4 episodes of vertigo per year.  Patient reports that sometimes changes in the seasons and weather such as when a storm system is coming in that she will notice this bringing on her symptoms.  Patient states she was doing some home renovation and staining of room and noticed that this brought on symptoms.  Patient states she veers when she walks at times.  Patient reports that she has had several sinus infections and when they were treated this helped to decrease her symptoms some as well.  Patient has tried meclizine and Valium but states they did not help her dizziness symptoms and that meclizine makes her sleepy.  She reports driving is very challenging and states she has difficulty with peripheral things going by affecting her vision when in the car.  Patient states she had an episode while driving of dizziness where she got tunnel vision once and that she now limits her driving. Patient reports that driving also causes her fatigue.   Patient reports that she has conditioned herself to live differently and that she tries to avoid making sudden movements and positions where she might get unsteady.  Patient reports she walks 2 miles a day with her walking stick.  SYMPTOM BEHAVIOR:  Type of dizziness: Imbalance (Disequilibrium), Spinning/Vertigo, and Unsteady with head/body turns Frequency: daily sensation of disequilibrium and  punctuated episodes 3-4 times a year of vertigo Duration: baseline disequilibrium sensation all the time Aggravating factors: Induced by motion: bending down to the ground, turning body quickly, turning head quickly, and driving, Worse in the dark, Worse outside or in busy environment, and stress  Relieving factors: lying supine, closing eyes, rest, slow movements, and avoid busy/distracting environments Progression of symptoms: better because patient reports she has learned to adapt and avoids certain movements and positions; Reports has been a little better since having had the VNG History of similar episodes: none prior to 2016 Auditory complaints (tinnitus, pain, drainage): "crickets" "noise" at times in both ears; denies hearing loss Vision (last eye exam, diplopia, recent changes): wears eye glasses, got new glasses this year and had last visit in December.  She has noticed depth perception issues. Current Symptoms: (dysarthria, dysphagia, drop attacks, bowel and bladder changes, recent weight loss/gain)    Review of systems negative for red flags.      DIAGNOSTIC FINDINGS: per medical record, VNG revealed 43% right unilateral hypofunction  PRECAUTIONS: fall  SUBJECTIVE:  Pt states she got back  from the beach on Saturday. Pt states the ride down to the beach and this caused some dizziness. She said they went shopping and there were a few days with rainy weather and she had some dizziness. Pt reports that she had a few good days and a few days where she had more dizziness. Patient states she went to the orthopedic doctor last week and they recommended PT for her neck. Pt states that they said there was no structural issue with her vertebrae.   PAIN:  Are you having pain?  None stated    states she has some stiffness in her neck when it gets rainy but states it is not hurting now.   PATIENT GOALS: driving, stronger, less episodes and intensity of vertigo and dysequillibrium.  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 Pt states she holds the pew in front of her and looks at a TV screen to read a prompter when singing. Pt performed treadmill walking while looking at an iPAD screen viewing Wallace Keller You Tube video Visual Tracking Exercise #1 and #2 at 50% reduced playback speed.  She reports that her symptoms at church are usually a 3/10. Pt reports that her symptoms while on the treadmill are a 1/10 while watching the 1st video after about 5 minutes of viewing. Pt reports her symptoms increased to 2/10 while watching the 2nd video. Pt watched the second video for about 6-7 minutes.    Visual target exercise (Pencil in the straw exercise):  Pt reports varying the target for the toothpick in the straw is more challenging in sitting.  Pt performed in standing on firm and then on Airex pad with normal base of support, while holding red foam tube at varying positions while trying to place pencil in the center of the target hole. Added the progression of standing on firm and on a pillow at the kitchen counter with the pencil in the straw exercise for home.   Chin Tucks: In sitting, demonstrated and discussed how to perform chin tucks.  Issued via [place] for home exercise.   08/13/21: Pencil Push-up exercises: Pt performed pencil push-up exercises multiple reps. Pt demonstrating good convergence bilaterally.    Fransisco Beau String exercises: Worked on [employer] exercises for about 10 minutes focusing on the 3 different bead targets and working on progressions.   Ball toss over shoulder:  Patient performed multiple 125' trials of forward ambulation while tossing ball over one shoulder with return catch over opposite shoulder varying the ball position to head, shoulder and waist level to promote head turning and tilting with contact-guard assistance. Pt  demonstrated no losses of balance or veering.  Patient reports mild dizziness rated 1/10 with this activity.  [place] balance machine:  On [place] balance machine, worked on maze activity multiple reps with contact-guard assistance.  Patient initially having difficulty controlling the target with weight shifting but improved with practice.  Discussed and demonstrated performing pencil/toothpick in the straw exercise and pt to perform at varying distances and to left and right side of center.   08/06/2021: Pencil Push-up exercises: Demonstrated and discussed Pencil push-up exercise. Worked on pencil push-up exercises multiple reps with verbal cuing and covering one eye and then the other in order to promote both eyes focusing and staying on the target.   Issued this exercise for home exercise program with handout provided via [place].  Visual target exercise (Pencil in the straw exercise):  Demonstrated and discussed holding red foam tubing at arms length while focusing on the hole at the top of the tube while trying to insert a pencil in the hole with the other hand. Patient focused both eyes on the red foam tubing with hole at the top target and attempts to insert a pencil in the hole.  Performed with the red foam tubing oriented so that the hole was facing the patient and then repeated activity with the hole facing the ceiling. Patient initially touching the foam or the edges of the hole instead of the center of the hole but improved with practice. Issued this exercise using a pencil and a straw for home with handout provided.  Diona Foley toss over shoulder: Patient performed multiple 125' trials of forward and retro ambulation while tossing ball over one shoulder with return catch over opposite shoulder with contact-guard assistance.  Patient performed multiple 125' trials of forward ambulation while tossing ball over one shoulder with return catch over opposite shoulder varying the ball position to  head, shoulder and waist level to promote head turning and tilting with contact-guard assistance. Patient reports mild imbalance sensation with this activity.  08/02/2021:  VOR x 2 exercise:  Demonstrated and educated as to VOR X 2. Patient performed VOR X 2 horiz in sitting 3 reps of 1 minute each with verbal cues for technique.  Patient reports 2/10 dizziness. Added this exercise to home exercise program.  Discussed to start in sitting and if pt is able to do without it recreating dizziness at home, then can progress to standing in front of plain wall.   Cone Pick up: Pt worked on ambulation with turning to bend over to pick up cones on floor in straight line and then passing them back on opposite side while following with eyes and head.  Repeated activity with cones placed in non-linear pattern around room. Pt reports this activity reproduced mild dizziness.   Bounce Passes: Patient performed ambulation 50' trials while doing alternating sides bounce passes to self with ball while tracking ball with eyes and head with CGA.  Pt reports no dizziness and demonstrated no veering and no LOB.   Diona Foley toss over shoulder: Patient performed multiple 50' trials of forward ambulation while tossing ball over one shoulder with return catch over opposite shoulder with CGA.  Patient reports increase dizziness rated 3/10 with this activity.  Note: Portions of this document were prepared using Dragon voice recognition software and although reviewed may contain unintentional dictation errors in syntax, grammar, or spelling.  PATIENT EDUCATION: Education details:  issued chin tucks via [place] and worked on progression of pencil/toothpick in straw exercise standing on firm and then on pillow while standing at [employer] for home exercise program. Person educated: Patient Education method: Consulting civil engineer, Media planner, Verbal cues Education comprehension: verbalized understanding and demonstrated  understanding  Patient Home Exercise Program: [id number] Exercises -Chin Tucks- 1 x daily- 7 x weekly - 1 set- 10 reps - Pencil Pushups  - 1 x daily - 7 x weekly - 2 sets - 10 reps Vestibular slides 1, 3, 4,5 and body wall rolls; VOR X1 with conflicting background with vertical and horizontal head turns.  VOR X 1 in standing horizontal Feet together and semitandem progressions standing on a pillow with horizontal and vertical head turns and body turns.  VOR X 2 in sitting 1 minute reps with plain background  GOALS: Goals reviewed with patient? Yes  SHORT  TERM GOALS: Target date: 08/13/2021  Pt will be independent with HEP in order to improve balance and decrease dizziness symptoms in order to decrease fall risk and improve function at home and work. Baseline: Goal status: Achieved  LONG TERM GOALS: Target date: 09/10/2021  Patient will have improved FOTO score of 6 points or greater in order to demonstrate improvements in patient's ADLs and functional performance.  Baseline: 47/100 on 07/09/2021 Goal status: ongoing  2.  Patient will report 50% or greater improvement in their symptoms of dizziness and imbalance with provoking motions or positions. Baseline: pt reports quick movements, bending over and driving bring on symptoms; Goal status: ongoing  3.  Patient will have demonstrate decreased falls risk as indicated by Activities Specific Balance Confidence Scale score of 80% or greater. Baseline: 62% on 07/09/21; Goal status: ongoing  4. Patient will reduce perceived disability to low levels as indicated by <40 on Dizziness Handicap Inventory. Baseline: 52/100 on 07/09/21;  Goal status: ongoing  ASSESSMENT:  CLINICAL IMPRESSION: Pt reports that  *** Pt able to work on progressions of the pencil/toothpick in the straw exercise this date and she did well with Brocks string exercise demonstrating good convergence. Pt reports 1/10 dizziness symptoms with retro ambulation with ball toss  over shoulder at varying heights progression this date. Pt's balance challenged by [facility] maze activity but improved with practice and would benefit from repeat practice next session. Patient would benefit from continued vestibular PT services to further address goals and functional deficits.  OBJECTIVE IMPAIRMENTS decreased balance, difficulty walking, and dizziness.   ACTIVITY LIMITATIONS driving and shopping.   PERSONAL FACTORS Past/current experiences, Time since onset of injury/illness/exacerbation, and 1-2 comorbidities: vestibular migraines, sleep disorder are also affecting patient's functional outcome.   REHAB POTENTIAL: Good  CLINICAL DECISION MAKING: Evolving/moderate complexity  EVALUATION COMPLEXITY: Moderate  PLAN: PT FREQUENCY: 1x/week  PT DURATION: 8 weeks  PLANNED INTERVENTIONS: Therapeutic exercises, Therapeutic activity, Neuromuscular re-education, Balance training, Gait training, Patient/Family education, and Joint mobilization  PLAN FOR NEXT SESSION:  Next session work on walking on treadmill with self-selected head turns  and while watching YouTube visual tracking exercise at increased playback speed.   Lady Deutscher PT, DPT [phone] Lady Deutscher, PT 08/30/2021, 3:23 PM

## 2021-09-03 ENCOUNTER — Ambulatory Visit: Payer: No Typology Code available for payment source | Admitting: Physical Therapy

## 2021-09-03 DIAGNOSIS — B9689 Other specified bacterial agents as the cause of diseases classified elsewhere: Secondary | ICD-10-CM | POA: Diagnosis not present

## 2021-09-03 DIAGNOSIS — R42 Dizziness and giddiness: Secondary | ICD-10-CM

## 2021-09-03 DIAGNOSIS — J019 Acute sinusitis, unspecified: Secondary | ICD-10-CM | POA: Diagnosis not present

## 2021-09-03 NOTE — Therapy (Signed)
OUTPATIENT PHYSICAL THERAPY TREATMENT RE-CERTIFICATION NOTE   Patient Name: Natalie [name] MRN: [id number] DOB:Aug 27, 1955, 66 y.o., female Today's Date: 09/05/2021  PCP: Glean Hess, MD REFERRING PROVIDER: Margaretha Sheffield, MD  END OF SESSION:   PT End of Session - 09/05/21 1432     Visit Number 8    Number of Visits 25    Date for PT Re-Evaluation 10/29/21    Authorization Type Eval performed on 07/09/21    PT Start Time 0941    PT Stop Time 1030    PT Time Calculation (min) 49 min    Equipment Utilized During Treatment Gait belt    Activity Tolerance Patient tolerated treatment well    Behavior During Therapy [facility] for tasks assessed/performed              Past Medical History:  Diagnosis Date   Benign neoplasm of sigmoid colon    Chronic constipation    Chronic sinusitis    Cystitis    Diverticulitis    Perforated bowel ([place])    Pleural effusion on right 2016   Preventative health care 05/24/2015   Past Surgical History:  Procedure Laterality Date   BREAST BIOPSY Right 03/15/2019   stereo biopsy/ x clip/ benign   COLONOSCOPY WITH PROPOFOL N/A 07/27/2015   Procedure: COLONOSCOPY WITH PROPOFOL;  Surgeon: Lucilla Lame, MD;  Location: [place];  Service: Endoscopy;  Laterality: N/A;   COLONOSCOPY WITH PROPOFOL N/A 03/14/2016   Procedure: COLONOSCOPY WITH PROPOFOL;  Surgeon: Lucilla Lame, MD;  Location: [place];  Service: Endoscopy;  Laterality: N/A;  Cannot arrive before 0830 AM   COLOSTOMY REVERSAL N/A 09/26/2015   Procedure: COLOSTOMY REVERSAL / TAKE DOWN;  Surgeon: Hubbard Robinson, MD;  Location: [facility];  Service: General;  Laterality: N/A;   LAPAROTOMY N/A 03/01/2015   Procedure: EXPLORATORY LAPAROTOMY, SIGMOID COLECTOMY, COLOSTOMY;  Surgeon: Hubbard Robinson, MD;  Location: [facility];  Service: General;  Laterality: N/A;   POLYPECTOMY  03/14/2016   Procedure: POLYPECTOMY;  Surgeon: Lucilla Lame, MD;  Location: [place];   Service: Endoscopy;;   Patient Active Problem List   Diagnosis Date Noted   Dizziness, nonspecific 07/31/2020   Periodic limb movement sleep disorder 03/14/2020   Osteopenia determined by x-ray 09/22/2019   Elevated LDL cholesterol level 07/18/2019   Vestibular migraine 11/27/2017   Hx of diverticulitis of colon    Benign neoplasm of ascending colon    History of colostomy reversal   ONSET DATE: 2016 after surgery  REFERRING DIAG: R42 dysequilibrium  THERAPY DIAG:  Dizziness and giddiness  PERTINENT HISTORY:  Patient reports that she began to have dizziness in 2016 after she had a bout of sepsis and underwent surgery where she needed to have a colostomy and a chest tube.  Patient reports after the surgery she began having equilibrium issues.  Patient reports she did receive ototoxic medications such as gentamicin after the surgery.  Patient states she saw Dr. Brigitte Pulse, neurologist, in regards to her dizziness symptoms.  Patient states Dr. Brigitte Pulse felt that she did not fit the classic category for vestibular migraines.  Then, patient had a work-up for Mnire's disease which Dr. Freddy Jaksch been felt she did not have per patient report.  Patient reports that he said she had visual perception difficulties.  Patient saw Dr. Kathyrn Sheriff, ENT physician, and patient reports that Dr. Kathyrn Sheriff felt that vestibular therapy might help strengthen and help her if it progressed down the road. Per Dr. Maisie Fus  medical record, patient has diagnosis of disequilibrium, dizziness and giddiness, Mnire's disease, and active cochleovestibular disorder.  On 06/07/2021 patient had VNG testing per medical record that revealed a 42% right unilateral weakness and abnormal smooth pursuits as well as abnormal saccades.  Patient reports that Dr. Kathyrn Sheriff thought it was central nervous system and not Mnire's disease. Patient reports that the VNG testing was intense and that it reproduced her symptoms but that since the VNG testing her  symptoms have been a little better overall. Patient describes her dizziness symptoms as a constant floating and disequilibrium sensation, "staggery", and vertigo.  Patient states she has had occasional distinct episodes of vertigo.  Patient reports the last episode of vertigo was in December 2022.  Pt reports staying still and closing her eyes it will subside in 15 minutes but if she moves it starts right back up and this lasts about a day. And then she gets increase dysequilibrium feeling for about a week until it drops back down to her baseline level of floaty feeling. Patient reports she has about 3-4 episodes of vertigo per year.  Patient reports that sometimes changes in the seasons and weather such as when a storm system is coming in that she will notice this bringing on her symptoms.  Patient states she was doing some home renovation and staining of room and noticed that this brought on symptoms.  Patient states she veers when she walks at times.  Patient reports that she has had several sinus infections and when they were treated this helped to decrease her symptoms some as well.  Patient has tried meclizine and Valium but states they did not help her dizziness symptoms and that meclizine makes her sleepy.  She reports driving is very challenging and states she has difficulty with peripheral things going by affecting her vision when in the car.  Patient states she had an episode while driving of dizziness where she got tunnel vision once and that she now limits her driving. Patient reports that driving also causes her fatigue.   Patient reports that she has conditioned herself to live differently and that she tries to avoid making sudden movements and positions where she might get unsteady.  Patient reports she walks 2 miles a day with her walking stick.  SYMPTOM BEHAVIOR:  Type of dizziness: Imbalance (Disequilibrium), Spinning/Vertigo, and Unsteady with head/body turns Frequency: daily sensation of  disequilibrium and punctuated episodes 3-4 times a year of vertigo Duration: baseline disequilibrium sensation all the time Aggravating factors: Induced by motion: bending down to the ground, turning body quickly, turning head quickly, and driving, Worse in the dark, Worse outside or in busy environment, and stress  Relieving factors: lying supine, closing eyes, rest, slow movements, and avoid busy/distracting environments Progression of symptoms: better because patient reports she has learned to adapt and avoids certain movements and positions; Reports has been a little better since having had the VNG History of similar episodes: none prior to 2016 Auditory complaints (tinnitus, pain, drainage): "crickets" "noise" at times in both ears; denies hearing loss Vision (last eye exam, diplopia, recent changes): wears eye glasses, got new glasses this year and had last visit in December.  She has noticed depth perception issues. Current Symptoms: (dysarthria, dysphagia, drop attacks, bowel and bladder changes, recent weight loss/gain)    Review of systems negative for red flags.      DIAGNOSTIC FINDINGS: per medical record, VNG revealed 43% right unilateral hypofunction  PRECAUTIONS: fall  SUBJECTIVE:  Pt reports that  she has noticed more dizziness with the rainy weather the last 3 days. Pt states her ears feel congested today. Pt states she felt she had been doing some better but that the rainy weather has impacted her in the last few days. Pt reports that standing to do the toothpick in the straw has been challenging and states she has been doing the visual tracking exercises and chin tuck exercises. She decreased the play back speed for the Visual tracking part 2 video but was able to play the part 1 video at regular speed.  Patient reports that when she does sewing, looking at her phone and stress all cause an increase in her neck symptoms.   PAIN:  Are you having pain?  None stated    states she  has some stiffness in her posterior left side of the neck .   PATIENT GOALS: driving, stronger, less episodes and intensity of vertigo and dysequillibrium.  OBJECTIVE:   VESTIBULAR TREATMENT:  09/03/2021: Repeated functional outcome testing and compared to prior testing and updated goals.   FUNCTIONAL OUTCOME MEASURES:  Results 07/09/21  09/03/21 Comments  DHI 52/100 50/100 Moderate perception of handicap; in need of intervention  ABC Scale 62% 69% Falls risk; in need of intervention  DGI 21/24 23/24 Safe for community mobility  FOTO 47/100 41/100  in need of intervention     [facility] PT Assessment - 09/05/21 1448       Dynamic Gait Index   Level Surface Normal    Change in Gait Speed Normal    Gait with Horizontal Head Turns Normal    Gait with Vertical Head Turns Normal    Gait and Pivot Turn Normal    Step Over Obstacle Normal    Step Around Obstacles Normal    Steps Mild Impairment    Total Score 23        Treadmill walking: Pt performed treadmill walking at 3.2 mph for 6 minutes while performing horizontal and then vertical head turns with contact guard assistance. Pt reports mild dizziness.   Then, pt performed random self-selected head turns with contact guard assistance.  Pt reports 3/10 dizziness with random head turning.   Upper Trap Stretch: Demonstrated and educated as to seated upper trap stretch. Pt performed 3 reps x 30 seconds left upper trap stretch.  Performed shoulder shrugs between reps of trap stretch.  Issued for HEP via [place].    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 Pt states she holds the pew in front of her and looks at a TV screen to read a prompter when singing. Pt performed treadmill walking while looking at an iPAD screen viewing Wallace Keller You Tube video Visual Tracking Exercise #1 and #2 at 50% reduced playback speed.  She reports that her symptoms at church  are usually a 3/10. Pt reports that her symptoms while on the treadmill are a 1/10 while watching the 1st video after about 5 minutes of viewing. Pt reports her symptoms increased to 2/10 while watching the 2nd video. Pt watched the second video for about 6-7 minutes.    Visual target exercise (Pencil in the straw exercise):  Pt reports varying the target for the toothpick in the straw is more challenging in sitting.  Pt performed in standing on firm and then on Airex pad with normal base of support, while holding red foam tube at varying positions while trying to place pencil in  the center of the target hole. Added the progression of standing on firm and on a pillow at the kitchen counter with the pencil in the straw exercise for home.   Chin Tucks: In sitting, demonstrated and discussed how to perform chin tucks.  Issued via [place] for home exercise.   Note: Portions of this document were prepared using Dragon voice recognition software and although reviewed may contain unintentional dictation errors in syntax, grammar, or spelling.  PATIENT EDUCATION: Education details:  issued upper trap stretch via [place] for home exercise program. Person educated: Patient Education method: Explanation, Demonstration, Verbal cues Education comprehension: verbalized understanding and demonstrated understanding  Patient Home Exercise Program:  Access Code: [email or web address] URL: [email or web address] Date: 09/03/2021 Prepared by: Lady Deutscher  Exercises - Pencil Pushups  - 1 x daily - 7 x weekly - 2 sets - 10 reps - Supine Chin Tuck  - 1 x daily - 7 x weekly - 1 sets - 10 reps - 3 seconds hold - Supine Cervical Retraction with Towel  - 1 x daily - 7 x weekly - 1 sets - 10 reps - 3 seconds hold - Seated Upper Trapezius Stretch  - 1 x daily - 7 x weekly - 1 sets - 3 reps - 30 seconds hold  Vestibular slides 1, 3, 4,5 and body wall rolls; VOR X1 with conflicting background with vertical  and horizontal head turns.  VOR X 1 in standing horizontal Feet together and semitandem progressions standing on a pillow with horizontal and vertical head turns and body turns.  VOR X 2 in sitting 1 minute reps with plain background  GOALS: Goals reviewed with patient? Yes  SHORT TERM GOALS: Target date: 08/13/2021  Pt will be independent with HEP in order to improve balance and decrease dizziness symptoms in order to decrease fall risk and improve function at home and work. Baseline: Goal status: Achieved  LONG TERM GOALS: Target date: 10/29/2021  Patient will have improved FOTO score of 6 points or greater in order to demonstrate improvements in patient's ADLs and functional performance.  Baseline: 47/100 on 07/09/2021; scored 42/100 on 09/03/21 Goal status: ongoing  2.  Patient will report 50% or greater improvement in their symptoms of dizziness and imbalance with provoking motions or positions. Baseline: pt reports quick movements, bending over and driving bring on symptoms; on 09/03/21, pt reports that she has seen an improvement but feels the weather has impacted her over the past several days. Pt states she has driven some and she drove herself to therapy today. Pt states yesterday she did not feel well enough to drive as she had increased dizziness. Pt states she drank some Gatoraide which she felt helped. Pt reports 70% improvement overall since starting therapy.  Goal status: Achieved  3.  Patient will have demonstrate decreased falls risk as indicated by Activities Specific Balance Confidence Scale score of 80% or greater. Baseline: 62% on 07/09/21; scored 69% on 09/03/21 Goal status: partially met  4. Patient will reduce perceived disability to low levels as indicated by <40 on Dizziness Handicap Inventory. Baseline: 52/100 on 07/09/21; 09/03/21 scored 50/100 Goal status: ongoing  ASSESSMENT:  CLINICAL IMPRESSION: Pt reports that she feels she had been doing better in regards to  her dizziness and balance symptoms except for the last few days due to the rainy weather. Repeated functional outcome testing and compared to prior testing. Patient has met short term goal of independence with home exercise program. Patient scored 50/100 in the Dizziness Handicap  Inventory which indicated moderate perception of handicap. Pt scored 23/24 on the Dynamic gait index indicating decreased falls risk. Pt scored 69% on the ABC scale partially meeting this long term goal. Pt reports 70% improvement overall in her symptoms of dizziness and imbalance and has met this long term goal. Pt reports left neck pain which could be impacting her dizziness symptoms. Patient reports that sewing, looking at her phone and stress all cause an increase in her neck symptoms. Pt would benefit from further assessment of her neck symptoms and continued PT services to address functional deficits and goals.   OBJECTIVE IMPAIRMENTS decreased balance, difficulty walking, and dizziness.   ACTIVITY LIMITATIONS driving and shopping.   PERSONAL FACTORS Past/current experiences, Time since onset of injury/illness/exacerbation, and 1-2 comorbidities: vestibular migraines, sleep disorder are also affecting patient's functional outcome.   REHAB POTENTIAL: Good  CLINICAL DECISION MAKING: Evolving/moderate complexity  EVALUATION COMPLEXITY: Moderate  PLAN: PT FREQUENCY: 2x/week  PT DURATION: 8 weeks  PLANNED INTERVENTIONS: Therapeutic exercises, Therapeutic activity, Canalith Repositioning, Neuromuscular re-education, Balance training, Gait training, Patient/Family education, and Joint mobilization, moist heat, Passive range of motion, Dry needling, manual techniques  PLAN FOR NEXT SESSION:  Next session plan to further assess neck and review upper trap stretches.   Lady Deutscher PT, DPT [phone] Lady Deutscher, PT 09/05/2021, 2:36 PM

## 2021-09-09 ENCOUNTER — Ambulatory Visit: Payer: No Typology Code available for payment source | Attending: Otolaryngology

## 2021-09-09 DIAGNOSIS — M6281 Muscle weakness (generalized): Secondary | ICD-10-CM | POA: Insufficient documentation

## 2021-09-09 DIAGNOSIS — M542 Cervicalgia: Secondary | ICD-10-CM | POA: Diagnosis not present

## 2021-09-09 DIAGNOSIS — R42 Dizziness and giddiness: Secondary | ICD-10-CM | POA: Diagnosis not present

## 2021-09-09 NOTE — Therapy (Unsigned)
[place] MAIN [facility] SERVICES [address] [place], [place], [address] Phone: [phone]   Fax:  [phone]  Physical Therapy Treatment  Patient Details  Name: Natalie [name] MRN: [id number] Date of Birth: [birth year] No data recorded  Encounter Date: 09/09/2021    Past Medical History:  Diagnosis Date   Benign neoplasm of sigmoid colon    Chronic constipation    Chronic sinusitis    Cystitis    Diverticulitis    Perforated bowel ([place])    Pleural effusion on right 2016   Preventative health care 05/24/2015    Past Surgical History:  Procedure Laterality Date   BREAST BIOPSY Right 03/15/2019   stereo biopsy/ x clip/ benign   COLONOSCOPY WITH PROPOFOL N/A 07/27/2015   Procedure: COLONOSCOPY WITH PROPOFOL;  Surgeon: Lucilla Lame, MD;  Location: [place];  Service: Endoscopy;  Laterality: N/A;   COLONOSCOPY WITH PROPOFOL N/A 03/14/2016   Procedure: COLONOSCOPY WITH PROPOFOL;  Surgeon: Lucilla Lame, MD;  Location: [place];  Service: Endoscopy;  Laterality: N/A;  Cannot arrive before 0830 AM   COLOSTOMY REVERSAL N/A 09/26/2015   Procedure: COLOSTOMY REVERSAL / TAKE DOWN;  Surgeon: Hubbard Robinson, MD;  Location: [facility];  Service: General;  Laterality: N/A;   LAPAROTOMY N/A 03/01/2015   Procedure: EXPLORATORY LAPAROTOMY, SIGMOID COLECTOMY, COLOSTOMY;  Surgeon: Hubbard Robinson, MD;  Location: [facility];  Service: General;  Laterality: N/A;   POLYPECTOMY  03/14/2016   Procedure: POLYPECTOMY;  Surgeon: Lucilla Lame, MD;  Location: [place];  Service: Endoscopy;;    There were no vitals filed for this visit.       INTERVENTIONS -   Subjective: Pt reports neck pain and stiffness/tension has gotten progressively worse over past two months. Pt reports pain is worse on L than R. She thinks weather changes can trigger it. Pt recently saw Sports Med Doc, had xray, reports unremarkable imaging per pt. Neck  pain is 3/10 currently. Worst pain is 8-9/10 mostly felt at night and can be relieved with changing sleeping position. Describes pain as sharp at times. No n/t. Pt says sleeping on L side makes it worse. Pt has had some headaches but is unsure if this is related. Pt reports headaches 2x/week. Can feel pain in the back of her head. She has trouble getting to sleep and staying asleep.  Therex- MMT UE: Grossly 4+/5 RUE, 4/5 LUE, *Pain with resisted ER/IR on L  AROM bilat shoulder flex/ext: pain with LUE for both flex/ext, more with flexion, however ROM WNL bilat. ER on L painful but WNL ROM  ROM cervical:  Lateral flexion: 15 deg. and painful bilat  Flexion: 22 deg. Tightness felt on L side Ext: 12 deg.  Tightness felt on L side Rotation:   R- 46 deg.  L- 32 deg. And painful   Pt found to be TTP throughout L posterior shoulder musculature, L upper trap, L cervical paraspinals and in musculature of occipital triangle.   Seated UT stretch 2x30 sec bilat  YTB and RTB seated ER/pull-aparts 10x for each  Manual- PT provides STM while pt in supine bilat, with focus primarily on L posterior shoulder musculature, L UT and L cervical paraspinals, muscles of occipital triangle. Additionally, PT brought pt through UT stretch bilat and provided gentle suboccip release. X 6 min   Instructed pt in HEP Access Code: [email or web address] URL: [email or web address] Date: 09/09/2021 Prepared by: Ricard Dillon  Exercises - Seated Upper Trapezius Stretch  -  1 x daily - 7 x weekly - 2 sets - 2 reps - 30 hold - Seated Bilateral Shoulder External Rotation with Resistance  - 1 x daily - 5 x weekly - 3 sets - 10 reps    Assessment: Further assessment completed on this date. Pt found to be TTP throughout L posterior shoulder and cervical musculature. Pt with impaired B UE strength, where L ER/IR was pain limited. Pt AROM of BUE is WNL, however, pt with painful LUE flex/ext. Pt cervical ROM limited in all  planes and painful throughout. PT issued two additional exercises to HEP to address these deficits. The pt will benefit from further skilled PT to improve pain, ROM, dizziness and strength.   Pt educated throughout session about proper posture and technique with exercises. Improved exercise technique, movement at target joints, use of target muscles after min to mod verbal, visual, tactile cues.  Rationale for Evaluation and Treatment Rehabilitation    Patient will benefit from skilled therapeutic intervention in order to improve the following deficits and impairments:     Visit Diagnosis: No diagnosis found.     Problem List Patient Active Problem List   Diagnosis Date Noted   Dizziness, nonspecific 07/31/2020   Periodic limb movement sleep disorder 03/14/2020   Osteopenia determined by x-ray 09/22/2019   Elevated LDL cholesterol level 07/18/2019   Vestibular migraine 11/27/2017   Hx of diverticulitis of colon    Benign neoplasm of ascending colon    History of colostomy reversal     Zollie Pee, PT 09/09/2021, 3:18 PM  [place] MAIN [facility] SERVICES [address] [place], [place], [address] Phone: [phone]   Fax:  [phone]  Name: Natalie [name] MRN: [id number] Date of Birth: [birth year]

## 2021-09-10 ENCOUNTER — Encounter: Payer: Medicare HMO | Admitting: Physical Therapy

## 2021-09-10 NOTE — Therapy (Signed)
OUTPATIENT PHYSICAL THERAPY TREATMENT NOTE/Physical Therapy Progress Note   Dates of reporting period  07/09/2021   to   09/12/2021    Patient Name: Natalie [name]    MRN: [id number] DOB:1955-11-14, 66 y.o., female Today's Date: 09/13/2021  PCP: Glean Hess, MD REFERRING PROVIDER: Margaretha Sheffield, MD  END OF SESSION:   PT End of Session - 09/13/21 0808     Visit Number 10    Number of Visits 25    Date for PT Re-Evaluation 10/29/21    Authorization Type Eval performed on 07/09/21    PT Start Time 0941    PT Stop Time 1014    PT Time Calculation (min) 33 min    Equipment Utilized During Treatment Gait belt    Activity Tolerance Patient tolerated treatment well    Behavior During Therapy [facility] for tasks assessed/performed               Past Medical History:  Diagnosis Date   Benign neoplasm of sigmoid colon    Chronic constipation    Chronic sinusitis    Cystitis    Diverticulitis    Perforated bowel ([place])    Pleural effusion on right 2016   Preventative health care 05/24/2015   Past Surgical History:  Procedure Laterality Date   BREAST BIOPSY Right 03/15/2019   stereo biopsy/ x clip/ benign   COLONOSCOPY WITH PROPOFOL N/A 07/27/2015   Procedure: COLONOSCOPY WITH PROPOFOL;  Surgeon: Lucilla Lame, MD;  Location: [place];  Service: Endoscopy;  Laterality: N/A;   COLONOSCOPY WITH PROPOFOL N/A 03/14/2016   Procedure: COLONOSCOPY WITH PROPOFOL;  Surgeon: Lucilla Lame, MD;  Location: [place];  Service: Endoscopy;  Laterality: N/A;  Cannot arrive before 0830 AM   COLOSTOMY REVERSAL N/A 09/26/2015   Procedure: COLOSTOMY REVERSAL / TAKE DOWN;  Surgeon: Hubbard Robinson, MD;  Location: [facility];  Service: General;  Laterality: N/A;   LAPAROTOMY N/A 03/01/2015   Procedure: EXPLORATORY LAPAROTOMY, SIGMOID COLECTOMY, COLOSTOMY;  Surgeon: Hubbard Robinson, MD;  Location: [facility];  Service: General;  Laterality: N/A;   POLYPECTOMY  03/14/2016    Procedure: POLYPECTOMY;  Surgeon: Lucilla Lame, MD;  Location: [place];  Service: Endoscopy;;   Patient Active Problem List   Diagnosis Date Noted   Dizziness, nonspecific 07/31/2020   Periodic limb movement sleep disorder 03/14/2020   Osteopenia determined by x-ray 09/22/2019   Elevated LDL cholesterol level 07/18/2019   Vestibular migraine 11/27/2017   Hx of diverticulitis of colon    Benign neoplasm of ascending colon    History of colostomy reversal   ONSET DATE: 2016 after surgery  REFERRING DIAG: R42 dysequilibrium  THERAPY DIAG:  Cervicalgia  Dizziness and giddiness  PERTINENT HISTORY:  Patient reports that she began to have dizziness in 2016 after she had a bout of sepsis and underwent surgery where she needed to have a colostomy and a chest tube.  Patient reports after the surgery she began having equilibrium issues.  Patient reports she did receive ototoxic medications such as gentamicin after the surgery.  Patient states she saw Dr. Brigitte Pulse, neurologist, in regards to her dizziness symptoms.  Patient states Dr. Brigitte Pulse felt that she did not fit the classic category for vestibular migraines.  Then, patient had a work-up for Mnire's disease which Dr. Freddy Jaksch been felt she did not have per patient report.  Patient reports that he said she had visual perception difficulties.  Patient saw Dr. Kathyrn Sheriff, ENT physician, and patient  reports that Dr. Kathyrn Sheriff felt that vestibular therapy might help strengthen and help her if it progressed down the road. Per Dr. Maisie Fus medical record, patient has diagnosis of disequilibrium, dizziness and giddiness, Mnire's disease, and active cochleovestibular disorder.  On 06/07/2021 patient had VNG testing per medical record that revealed a 42% right unilateral weakness and abnormal smooth pursuits as well as abnormal saccades.  Patient reports that Dr. Kathyrn Sheriff thought it was central nervous system and not Mnire's disease. Patient reports that the  VNG testing was intense and that it reproduced her symptoms but that since the VNG testing her symptoms have been a little better overall. Patient describes her dizziness symptoms as a constant floating and disequilibrium sensation, "staggery", and vertigo.  Patient states she has had occasional distinct episodes of vertigo.  Patient reports the last episode of vertigo was in December 2022.  Pt reports staying still and closing her eyes it will subside in 15 minutes but if she moves it starts right back up and this lasts about a day. And then she gets increase dysequilibrium feeling for about a week until it drops back down to her baseline level of floaty feeling. Patient reports she has about 3-4 episodes of vertigo per year.  Patient reports that sometimes changes in the seasons and weather such as when a storm system is coming in that she will notice this bringing on her symptoms.  Patient states she was doing some home renovation and staining of room and noticed that this brought on symptoms.  Patient states she veers when she walks at times.  Patient reports that she has had several sinus infections and when they were treated this helped to decrease her symptoms some as well.  Patient has tried meclizine and Valium but states they did not help her dizziness symptoms and that meclizine makes her sleepy.  She reports driving is very challenging and states she has difficulty with peripheral things going by affecting her vision when in the car.  Patient states she had an episode while driving of dizziness where she got tunnel vision once and that she now limits her driving. Patient reports that driving also causes her fatigue.   Patient reports that she has conditioned herself to live differently and that she tries to avoid making sudden movements and positions where she might get unsteady.  Patient reports she walks 2 miles a day with her walking stick.  SYMPTOM BEHAVIOR:  Type of dizziness: Imbalance  (Disequilibrium), Spinning/Vertigo, and Unsteady with head/body turns Frequency: daily sensation of disequilibrium and punctuated episodes 3-4 times a year of vertigo Duration: baseline disequilibrium sensation all the time Aggravating factors: Induced by motion: bending down to the ground, turning body quickly, turning head quickly, and driving, Worse in the dark, Worse outside or in busy environment, and stress  Relieving factors: lying supine, closing eyes, rest, slow movements, and avoid busy/distracting environments Progression of symptoms: better because patient reports she has learned to adapt and avoids certain movements and positions; Reports has been a little better since having had the VNG History of similar episodes: none prior to 2016 Auditory complaints (tinnitus, pain, drainage): "crickets" "noise" at times in both ears; denies hearing loss Vision (last eye exam, diplopia, recent changes): wears eye glasses, got new glasses this year and had last visit in December.  She has noticed depth perception issues. Current Symptoms: (dysarthria, dysphagia, drop attacks, bowel and bladder changes, recent weight loss/gain)    Review of systems negative for red flags.  DIAGNOSTIC FINDINGS: per medical record, VNG revealed 43% right unilateral hypofunction  PRECAUTIONS: fall  SUBJECTIVE:  Pt reports current pain level is 2/10. She reports her neck feels slightly less stiff compared to previous session. Dizziness is currently somewhat better.  PAIN:  Are you having pain? Yes: NPRS scale: 2/10 Pain location: L side neck Pain description:   Aggravating factors:   Relieving factors:   and        PATIENT GOALS: driving, stronger, less episodes and intensity of vertigo and dysequillibrium.  OBJECTIVE:   VESTIBULAR TREATMENT:  09/12/2021:   Manual- SPT assists with session and provides the following interventions while pt in supine: UT trap stretch with shoulder depression 30 sec B x  2 sets. Cervical rotation stretch with STM provided (see STM section) 2x 30 sec bilat.  STM to bilateral cervical paraspinals, muscles of occipital triangle and bilat UT, TrP release with focus on L UT, and gentle manual cervical distraction x several minutes   Therex- RTB ER 2x15 B RTB rows 3x15 B Seated cervical ext. Stretch 60 sec Seated cervical flex stretch 60 sec  Seated thoracic ext over chair 15x  Stability ball rollouts FWD/BCKWD x 2 min, instructed in holding end-range position for count of 3 sec RTB IR 2x15 each UE  RTB L shoulder flexion 15x2 sets   09/09/2021:  INTERVENTIONS -    Therex- MMT UE: Grossly 4+/5 RUE, 4/5 LUE, *Pain with resisted ER/IR on L  AROM bilat shoulder flex/ext: pain with LUE for both flex/ext, more with flexion, however ROM WNL bilat. ER on L painful but WNL ROM  ROM cervical:  Lateral flexion: 15 deg. and painful bilat  Flexion: 22 deg. Tightness felt on L side Ext: 12 deg.  Tightness felt on L side Rotation:   R- 46 deg.  L- 32 deg. And painful   Pt found to be TTP throughout L posterior shoulder musculature, L upper trap, L cervical paraspinals and in musculature of occipital triangle.   Seated UT stretch 2x30 sec bilat  YTB and RTB seated ER/pull-aparts 10x for each  Manual- PT provides STM while pt in supine bilat, with focus primarily on L posterior shoulder musculature, L UT and L cervical paraspinals, muscles of occipital triangle. Additionally, PT brought pt through UT stretch bilat and provided gentle suboccip release. X 6 min  Rationale for Evaluation and Treatment Rehabilitation   09/03/2021: Repeated functional outcome testing and compared to prior testing and updated goals.   FUNCTIONAL OUTCOME MEASURES:  Results 07/09/21  09/03/21 Comments  DHI 52/100 50/100 Moderate perception of handicap; in need of intervention  ABC Scale 62% 69% Falls risk; in need of intervention  DGI 21/24 23/24 Safe for community mobility   FOTO 47/100 41/100  in need of intervention     [facility] PT Assessment - 09/05/21 1448       Dynamic Gait Index   Level Surface Normal    Change in Gait Speed Normal    Gait with Horizontal Head Turns Normal    Gait with Vertical Head Turns Normal    Gait and Pivot Turn Normal    Step Over Obstacle Normal    Step Around Obstacles Normal    Steps Mild Impairment    Total Score 23        Treadmill walking: Pt performed treadmill walking at 3.2 mph for 6 minutes while performing horizontal and then vertical head turns with contact guard assistance. Pt reports mild dizziness.   Then, pt performed random  self-selected head turns with contact guard assistance.  Pt reports 3/10 dizziness with random head turning.   Upper Trap Stretch: Demonstrated and educated as to seated upper trap stretch. Pt performed 3 reps x 30 seconds left upper trap stretch.  Performed shoulder shrugs between reps of trap stretch.  Issued for HEP via [place].    PATIENT EDUCATION: Education details:  Pt educated throughout session about proper posture and technique with exercises. Improved exercise technique, movement at target joints, use of target muscles after min to mod verbal, visual, tactile cues. Person educated: Patient Education method: Explanation, Demonstration, Verbal cues, Tactile cues Education comprehension: verbalized understanding and demonstrated understanding  Patient Home Exercise Program:  No updates 09/12/2021  Instructed pt in HEP Access Code: [email or web address] URL: [email or web address] Date: 09/09/2021 Prepared by: Ricard Dillon  Exercises - Seated Upper Trapezius Stretch  - 1 x daily - 7 x weekly - 2 sets - 2 reps - 30 hold - Seated Bilateral Shoulder External Rotation with Resistance  - 1 x daily - 5 x weekly - 3 sets - 10 reps  Access Code: [email or web address] URL: [email or web address] Date: 09/03/2021 Prepared by: Lady Deutscher  Exercises - Pencil Pushups  -  1 x daily - 7 x weekly - 2 sets - 10 reps - Supine Chin Tuck  - 1 x daily - 7 x weekly - 1 sets - 10 reps - 3 seconds hold - Supine Cervical Retraction with Towel  - 1 x daily - 7 x weekly - 1 sets - 10 reps - 3 seconds hold - Seated Upper Trapezius Stretch  - 1 x daily - 7 x weekly - 1 sets - 3 reps - 30 seconds hold  Vestibular slides 1, 3, 4,5 and body wall rolls; VOR X1 with conflicting background with vertical and horizontal head turns.  VOR X 1 in standing horizontal Feet together and semitandem progressions standing on a pillow with horizontal and vertical head turns and body turns.  VOR X 2 in sitting 1 minute reps with plain background  GOALS: Goals reviewed with patient? Yes  SHORT TERM GOALS: Target date: 08/13/2021  Pt will be independent with HEP in order to improve balance and decrease dizziness symptoms in order to decrease fall risk and improve function at home and work. Baseline: Goal status: Achieved  LONG TERM GOALS: Target date: 10/29/2021  Patient will have improved FOTO score of 6 points or greater in order to demonstrate improvements in patient's ADLs and functional performance.  Baseline: 47/100 on 07/09/2021; scored 42/100 on 09/03/21 Goal status: ongoing  2.  Patient will report 50% or greater improvement in their symptoms of dizziness and imbalance with provoking motions or positions. Baseline: pt reports quick movements, bending over and driving bring on symptoms; on 09/03/21, pt reports that she has seen an improvement but feels the weather has impacted her over the past several days. Pt states she has driven some and she drove herself to therapy today. Pt states yesterday she did not feel well enough to drive as she had increased dizziness. Pt states she drank some Gatoraide which she felt helped. Pt reports 70% improvement overall since starting therapy.  Goal status: Achieved  3.  Patient will have demonstrate decreased falls risk as indicated by Activities  Specific Balance Confidence Scale score of 80% or greater. Baseline: 62% on 07/09/21; scored 69% on 09/03/21 Goal status: partially met  4. Patient will reduce perceived disability to low levels as indicated by <40 on Dizziness Handicap Inventory. Baseline: 52/100  on 07/09/21; 09/03/21 scored 50/100 Goal status: ongoing   5. Patient will report worst neck pain reaches no greater than a 2/10 in order to improve QOL. Baseline: worst pain currently 8-9/10 Goal status: NEW  6. The patient will demonstrate an increase of at least 10 degrees in all planes of cervical ROM to improve mobility and ease with scanning the environment with gait.    Baseline: lateral flexion 15 degrees B; flexion 22 deg.; extension 12 degrees; R rotation 46 degrees; L rotation 32 degrees Goal status: NEW    ASSESSMENT:  CLINICAL IMPRESSION:  New goals added for progress note to address cervical pain and ROM limitations. Goals recently reassessed on 09/03/2021, please refer to note on this date for further information. Continued focus this session on manual therapy followed by strengthening interventions. Pt noted throughout greater L sided shoulder discomfort/tightness sensation compared to R. Limitations in shoulder strength likely impacting pt's cervical symptoms. The pt will continue to benefit from further skilled PT to improve strength, ROM, and pain to increase QOL and ease with scanning/ADLs.   OBJECTIVE IMPAIRMENTS decreased balance, difficulty walking, and dizziness.   ACTIVITY LIMITATIONS driving and shopping.   PERSONAL FACTORS Past/current experiences, Time since onset of injury/illness/exacerbation, and 1-2 comorbidities: vestibular migraines, sleep disorder are also affecting patient's functional outcome.   REHAB POTENTIAL: Good  CLINICAL DECISION MAKING: Evolving/moderate complexity  EVALUATION COMPLEXITY: Moderate  PLAN: PT FREQUENCY: 2x/week  PT DURATION: 8 weeks  PLANNED INTERVENTIONS:  Therapeutic exercises, Therapeutic activity, Canalith Repositioning, Neuromuscular re-education, Balance training, Gait training, Patient/Family education, and Joint mobilization, moist heat, Passive range of motion, Dry needling, manual techniques  PLAN FOR NEXT SESSION:   Next session plan to perform UE strengthening, stretching, manual, cervical isometrics   Ricard Dillon PT, DPT  Zollie Pee, PT 09/13/2021, 8:35 AM

## 2021-09-10 NOTE — Therapy (Signed)
OUTPATIENT PHYSICAL THERAPY TREATMENT NOTE   Patient Name: Natalie [name] MRN: [id number] DOB:[birth year], 66 y.o., female Today's Date: 09/10/2021  PCP: Glean Hess, MD REFERRING PROVIDER: Glean Hess, MD  END OF SESSION:   PT End of Session - 09/09/21 1704     Visit Number 9    Number of Visits 25    Date for PT Re-Evaluation 10/29/21    Authorization Type Eval performed on 07/09/21    PT Start Time 1516    PT Stop Time 1600    PT Time Calculation (min) 44 min    Equipment Utilized During Treatment Gait belt    Activity Tolerance Patient tolerated treatment well    Behavior During Therapy [facility] for tasks assessed/performed              Past Medical History:  Diagnosis Date   Benign neoplasm of sigmoid colon    Chronic constipation    Chronic sinusitis    Cystitis    Diverticulitis    Perforated bowel ([place])    Pleural effusion on right 2016   Preventative health care 05/24/2015   Past Surgical History:  Procedure Laterality Date   BREAST BIOPSY Right 03/15/2019   stereo biopsy/ x clip/ benign   COLONOSCOPY WITH PROPOFOL N/A 07/27/2015   Procedure: COLONOSCOPY WITH PROPOFOL;  Surgeon: Lucilla Lame, MD;  Location: [facility];  Service: Endoscopy;  Laterality: N/A;   COLONOSCOPY WITH PROPOFOL N/A 03/14/2016   Procedure: COLONOSCOPY WITH PROPOFOL;  Surgeon: Lucilla Lame, MD;  Location: [place];  Service: Endoscopy;  Laterality: N/A;  Cannot arrive before 0830 AM   COLOSTOMY REVERSAL N/A 09/26/2015   Procedure: COLOSTOMY REVERSAL / TAKE DOWN;  Surgeon: Hubbard Robinson, MD;  Location: [facility];  Service: General;  Laterality: N/A;   LAPAROTOMY N/A 03/01/2015   Procedure: EXPLORATORY LAPAROTOMY, SIGMOID COLECTOMY, COLOSTOMY;  Surgeon: Hubbard Robinson, MD;  Location: [facility];  Service: General;  Laterality: N/A;   POLYPECTOMY  03/14/2016   Procedure: POLYPECTOMY;  Surgeon: Lucilla Lame, MD;  Location: [facility];  Service:  Endoscopy;;   Patient Active Problem List   Diagnosis Date Noted   Dizziness, nonspecific 07/31/2020   Periodic limb movement sleep disorder 03/14/2020   Osteopenia determined by x-ray 09/22/2019   Elevated LDL cholesterol level 07/18/2019   Vestibular migraine 11/27/2017   Hx of diverticulitis of colon    Benign neoplasm of ascending colon    History of colostomy reversal   ONSET DATE: 2016 after surgery  REFERRING DIAG: R42 dysequilibrium  THERAPY DIAG:  Cervicalgia  Dizziness and giddiness  Muscle weakness (generalized)  PERTINENT HISTORY:  Patient reports that she began to have dizziness in 2016 after she had a bout of sepsis and underwent surgery where she needed to have a colostomy and a chest tube.  Patient reports after the surgery she began having equilibrium issues.  Patient reports she did receive ototoxic medications such as gentamicin after the surgery.  Patient states she saw Dr. Brigitte Pulse, neurologist, in regards to her dizziness symptoms.  Patient states Dr. Brigitte Pulse felt that she did not fit the classic category for vestibular migraines.  Then, patient had a work-up for Mnire's disease which Dr. Freddy Jaksch been felt she did not have per patient report.  Patient reports that he said she had visual perception difficulties.  Patient saw Dr. Kathyrn Sheriff, ENT physician, and patient reports that Dr. Kathyrn Sheriff felt that vestibular therapy might help strengthen and help her if it progressed  down the road. Per Dr. Maisie Fus medical record, patient has diagnosis of disequilibrium, dizziness and giddiness, Mnire's disease, and active cochleovestibular disorder.  On 06/07/2021 patient had VNG testing per medical record that revealed a 42% right unilateral weakness and abnormal smooth pursuits as well as abnormal saccades.  Patient reports that Dr. Kathyrn Sheriff thought it was central nervous system and not Mnire's disease. Patient reports that the VNG testing was intense and that it reproduced her symptoms  but that since the VNG testing her symptoms have been a little better overall. Patient describes her dizziness symptoms as a constant floating and disequilibrium sensation, "staggery", and vertigo.  Patient states she has had occasional distinct episodes of vertigo.  Patient reports the last episode of vertigo was in December 2022.  Pt reports staying still and closing her eyes it will subside in 15 minutes but if she moves it starts right back up and this lasts about a day. And then she gets increase dysequilibrium feeling for about a week until it drops back down to her baseline level of floaty feeling. Patient reports she has about 3-4 episodes of vertigo per year.  Patient reports that sometimes changes in the seasons and weather such as when a storm system is coming in that she will notice this bringing on her symptoms.  Patient states she was doing some home renovation and staining of room and noticed that this brought on symptoms.  Patient states she veers when she walks at times.  Patient reports that she has had several sinus infections and when they were treated this helped to decrease her symptoms some as well.  Patient has tried meclizine and Valium but states they did not help her dizziness symptoms and that meclizine makes her sleepy.  She reports driving is very challenging and states she has difficulty with peripheral things going by affecting her vision when in the car.  Patient states she had an episode while driving of dizziness where she got tunnel vision once and that she now limits her driving. Patient reports that driving also causes her fatigue.   Patient reports that she has conditioned herself to live differently and that she tries to avoid making sudden movements and positions where she might get unsteady.  Patient reports she walks 2 miles a day with her walking stick.  SYMPTOM BEHAVIOR:  Type of dizziness: Imbalance (Disequilibrium), Spinning/Vertigo, and Unsteady with head/body  turns Frequency: daily sensation of disequilibrium and punctuated episodes 3-4 times a year of vertigo Duration: baseline disequilibrium sensation all the time Aggravating factors: Induced by motion: bending down to the ground, turning body quickly, turning head quickly, and driving, Worse in the dark, Worse outside or in busy environment, and stress  Relieving factors: lying supine, closing eyes, rest, slow movements, and avoid busy/distracting environments Progression of symptoms: better because patient reports she has learned to adapt and avoids certain movements and positions; Reports has been a little better since having had the VNG History of similar episodes: none prior to 2016 Auditory complaints (tinnitus, pain, drainage): "crickets" "noise" at times in both ears; denies hearing loss Vision (last eye exam, diplopia, recent changes): wears eye glasses, got new glasses this year and had last visit in December.  She has noticed depth perception issues. Current Symptoms: (dysarthria, dysphagia, drop attacks, bowel and bladder changes, recent weight loss/gain)    Review of systems negative for red flags.      DIAGNOSTIC FINDINGS: per medical record, VNG revealed 43% right unilateral hypofunction  PRECAUTIONS: fall  SUBJECTIVE:  Pt reports neck pain and stiffness/tension has gotten progressively worse over past two months. Pt reports pain is worse on L than R. She thinks weather changes can trigger it. Pt recently saw Sports Med Doc, had xray, reports unremarkable imaging per pt. Neck pain is 3/10 currently. Worst pain is 8-9/10 mostly felt at night and can be relieved with changing sleeping position. Describes pain as sharp at times. No n/t. Pt says sleeping on L side makes it worse. Pt has had some headaches but is unsure if this is related. Pt reports headaches 2x/week. Can feel pain in the back of her head. She has trouble getting to sleep and staying asleep. PAIN:  Are you having pain?  Yes: NPRS scale: 3/10 Pain location: L side neck Pain description:   Aggravating factors:   Relieving factors:   and None stated      PATIENT GOALS: driving, stronger, less episodes and intensity of vertigo and dysequillibrium.  OBJECTIVE:   VESTIBULAR TREATMENT:  09/09/2021:  INTERVENTIONS -    Therex- MMT UE: Grossly 4+/5 RUE, 4/5 LUE, *Pain with resisted ER/IR on L  AROM bilat shoulder flex/ext: pain with LUE for both flex/ext, more with flexion, however ROM WNL bilat. ER on L painful but WNL ROM  ROM cervical:  Lateral flexion: 15 deg. and painful bilat  Flexion: 22 deg. Tightness felt on L side Ext: 12 deg.  Tightness felt on L side Rotation:   R- 46 deg.  L- 32 deg. And painful   Pt found to be TTP throughout L posterior shoulder musculature, L upper trap, L cervical paraspinals and in musculature of occipital triangle.   Seated UT stretch 2x30 sec bilat  YTB and RTB seated ER/pull-aparts 10x for each  Manual- PT provides STM while pt in supine bilat, with focus primarily on L posterior shoulder musculature, L UT and L cervical paraspinals, muscles of occipital triangle. Additionally, PT brought pt through UT stretch bilat and provided gentle suboccip release. X 6 min  Rationale for Evaluation and Treatment Rehabilitation   09/03/2021: Repeated functional outcome testing and compared to prior testing and updated goals.   FUNCTIONAL OUTCOME MEASURES:  Results 07/09/21  09/03/21 Comments  DHI 52/100 50/100 Moderate perception of handicap; in need of intervention  ABC Scale 62% 69% Falls risk; in need of intervention  DGI 21/24 23/24 Safe for community mobility  FOTO 47/100 41/100  in need of intervention     [facility] PT Assessment - 09/05/21 1448       Dynamic Gait Index   Level Surface Normal    Change in Gait Speed Normal    Gait with Horizontal Head Turns Normal    Gait with Vertical Head Turns Normal    Gait and Pivot Turn Normal    Step Over Obstacle  Normal    Step Around Obstacles Normal    Steps Mild Impairment    Total Score 23        Treadmill walking: Pt performed treadmill walking at 3.2 mph for 6 minutes while performing horizontal and then vertical head turns with contact guard assistance. Pt reports mild dizziness.   Then, pt performed random self-selected head turns with contact guard assistance.  Pt reports 3/10 dizziness with random head turning.   Upper Trap Stretch: Demonstrated and educated as to seated upper trap stretch. Pt performed 3 reps x 30 seconds left upper trap stretch.  Performed shoulder shrugs between reps of trap stretch.  Issued for HEP via [place].  PATIENT EDUCATION: Education details:  Pt educated throughout session about proper posture and technique with exercises. Improved exercise technique, movement at target joints, use of target muscles after min to mod verbal, visual, tactile cues. Person educated: Patient Education method: Explanation, Demonstration, Verbal cues Education comprehension: verbalized understanding and demonstrated understanding  Patient Home Exercise Program:   Instructed pt in HEP Access Code: [email or web address] URL: [email or web address] Date: 09/09/2021 Prepared by: Ricard Dillon  Exercises - Seated Upper Trapezius Stretch  - 1 x daily - 7 x weekly - 2 sets - 2 reps - 30 hold - Seated Bilateral Shoulder External Rotation with Resistance  - 1 x daily - 5 x weekly - 3 sets - 10 reps  Access Code: [email or web address] URL: [email or web address] Date: 09/03/2021 Prepared by: Lady Deutscher  Exercises - Pencil Pushups  - 1 x daily - 7 x weekly - 2 sets - 10 reps - Supine Chin Tuck  - 1 x daily - 7 x weekly - 1 sets - 10 reps - 3 seconds hold - Supine Cervical Retraction with Towel  - 1 x daily - 7 x weekly - 1 sets - 10 reps - 3 seconds hold - Seated Upper Trapezius Stretch  - 1 x daily - 7 x weekly - 1 sets - 3 reps - 30 seconds hold  Vestibular slides  1, 3, 4,5 and body wall rolls; VOR X1 with conflicting background with vertical and horizontal head turns.  VOR X 1 in standing horizontal Feet together and semitandem progressions standing on a pillow with horizontal and vertical head turns and body turns.  VOR X 2 in sitting 1 minute reps with plain background  GOALS: Goals reviewed with patient? Yes  SHORT TERM GOALS: Target date: 08/13/2021  Pt will be independent with HEP in order to improve balance and decrease dizziness symptoms in order to decrease fall risk and improve function at home and work. Baseline: Goal status: Achieved  LONG TERM GOALS: Target date: 10/29/2021  Patient will have improved FOTO score of 6 points or greater in order to demonstrate improvements in patient's ADLs and functional performance.  Baseline: 47/100 on 07/09/2021; scored 42/100 on 09/03/21 Goal status: ongoing  2.  Patient will report 50% or greater improvement in their symptoms of dizziness and imbalance with provoking motions or positions. Baseline: pt reports quick movements, bending over and driving bring on symptoms; on 09/03/21, pt reports that she has seen an improvement but feels the weather has impacted her over the past several days. Pt states she has driven some and she drove herself to therapy today. Pt states yesterday she did not feel well enough to drive as she had increased dizziness. Pt states she drank some Gatoraide which she felt helped. Pt reports 70% improvement overall since starting therapy.  Goal status: Achieved  3.  Patient will have demonstrate decreased falls risk as indicated by Activities Specific Balance Confidence Scale score of 80% or greater. Baseline: 62% on 07/09/21; scored 69% on 09/03/21 Goal status: partially met  4. Patient will reduce perceived disability to low levels as indicated by <40 on Dizziness Handicap Inventory. Baseline: 52/100 on 07/09/21; 09/03/21 scored 50/100 Goal status: ongoing  ASSESSMENT:  CLINICAL  IMPRESSION:  Further assessment completed on this date. Pt found to be TTP throughout L posterior shoulder and cervical musculature. Pt with impaired B UE strength, where L ER/IR was pain limited. Pt AROM of BUE is WNL, however, pt with painful LUE flex/ext. Pt cervical ROM limited in all planes and  painful throughout. PT issued two additional exercises to HEP to address these deficits. The pt will benefit from further skilled PT to improve pain, ROM, dizziness and strength.  OBJECTIVE IMPAIRMENTS decreased balance, difficulty walking, and dizziness.   ACTIVITY LIMITATIONS driving and shopping.   PERSONAL FACTORS Past/current experiences, Time since onset of injury/illness/exacerbation, and 1-2 comorbidities: vestibular migraines, sleep disorder are also affecting patient's functional outcome.   REHAB POTENTIAL: Good  CLINICAL DECISION MAKING: Evolving/moderate complexity  EVALUATION COMPLEXITY: Moderate  PLAN: PT FREQUENCY: 2x/week  PT DURATION: 8 weeks  PLANNED INTERVENTIONS: Therapeutic exercises, Therapeutic activity, Canalith Repositioning, Neuromuscular re-education, Balance training, Gait training, Patient/Family education, and Joint mobilization, moist heat, Passive range of motion, Dry needling, manual techniques  PLAN FOR NEXT SESSION:  Next session plan to perform UE strengthening, stretching  Ricard Dillon PT, DPT  Zollie Pee, PT 09/10/2021, 10:20 AM

## 2021-09-12 ENCOUNTER — Ambulatory Visit: Payer: No Typology Code available for payment source

## 2021-09-12 DIAGNOSIS — R42 Dizziness and giddiness: Secondary | ICD-10-CM

## 2021-09-12 DIAGNOSIS — M542 Cervicalgia: Secondary | ICD-10-CM | POA: Diagnosis not present

## 2021-09-16 ENCOUNTER — Ambulatory Visit: Payer: No Typology Code available for payment source

## 2021-09-16 DIAGNOSIS — M6281 Muscle weakness (generalized): Secondary | ICD-10-CM

## 2021-09-16 DIAGNOSIS — M542 Cervicalgia: Secondary | ICD-10-CM | POA: Diagnosis not present

## 2021-09-16 DIAGNOSIS — R42 Dizziness and giddiness: Secondary | ICD-10-CM

## 2021-09-16 NOTE — Therapy (Signed)
OUTPATIENT PHYSICAL THERAPY TREATMENT NOTE      Patient Name: Natalie [name]    MRN: [id number] DOB:December 08, 1955, 66 y.o., female Today's Date: 09/16/2021  PCP: Glean Hess, MD REFERRING PROVIDER: Margaretha Sheffield, MD  END OF SESSION:   PT End of Session - 09/16/21 1311     Visit Number 11    Number of Visits 25    Date for PT Re-Evaluation 10/29/21    Authorization Type Eval performed on 07/09/21    PT Start Time 1106    PT Stop Time 1151    PT Time Calculation (min) 45 min    Equipment Utilized During Treatment Gait belt    Activity Tolerance Patient tolerated treatment well    Behavior During Therapy [facility] for tasks assessed/performed                Past Medical History:  Diagnosis Date   Benign neoplasm of sigmoid colon    Chronic constipation    Chronic sinusitis    Cystitis    Diverticulitis    Perforated bowel ([place])    Pleural effusion on right 2016   Preventative health care 05/24/2015   Past Surgical History:  Procedure Laterality Date   BREAST BIOPSY Right 03/15/2019   stereo biopsy/ x clip/ benign   COLONOSCOPY WITH PROPOFOL N/A 07/27/2015   Procedure: COLONOSCOPY WITH PROPOFOL;  Surgeon: Lucilla Lame, MD;  Location: [place];  Service: Endoscopy;  Laterality: N/A;   COLONOSCOPY WITH PROPOFOL N/A 03/14/2016   Procedure: COLONOSCOPY WITH PROPOFOL;  Surgeon: Lucilla Lame, MD;  Location: [place];  Service: Endoscopy;  Laterality: N/A;  Cannot arrive before 0830 AM   COLOSTOMY REVERSAL N/A 09/26/2015   Procedure: COLOSTOMY REVERSAL / TAKE DOWN;  Surgeon: Hubbard Robinson, MD;  Location: [facility];  Service: General;  Laterality: N/A;   LAPAROTOMY N/A 03/01/2015   Procedure: EXPLORATORY LAPAROTOMY, SIGMOID COLECTOMY, COLOSTOMY;  Surgeon: Hubbard Robinson, MD;  Location: [facility];  Service: General;  Laterality: N/A;   POLYPECTOMY  03/14/2016   Procedure: POLYPECTOMY;  Surgeon: Lucilla Lame, MD;  Location: [place];   Service: Endoscopy;;   Patient Active Problem List   Diagnosis Date Noted   Dizziness, nonspecific 07/31/2020   Periodic limb movement sleep disorder 03/14/2020   Osteopenia determined by x-ray 09/22/2019   Elevated LDL cholesterol level 07/18/2019   Vestibular migraine 11/27/2017   Hx of diverticulitis of colon    Benign neoplasm of ascending colon    History of colostomy reversal   ONSET DATE: 2016 after surgery  REFERRING DIAG: R42 dysequilibrium  THERAPY DIAG:  Cervicalgia  Dizziness and giddiness  Muscle weakness (generalized)  PERTINENT HISTORY:  Patient reports that she began to have dizziness in 2016 after she had a bout of sepsis and underwent surgery where she needed to have a colostomy and a chest tube.  Patient reports after the surgery she began having equilibrium issues.  Patient reports she did receive ototoxic medications such as gentamicin after the surgery.  Patient states she saw Dr. Brigitte Pulse, neurologist, in regards to her dizziness symptoms.  Patient states Dr. Brigitte Pulse felt that she did not fit the classic category for vestibular migraines.  Then, patient had a work-up for Mnire's disease which Dr. Freddy Jaksch been felt she did not have per patient report.  Patient reports that he said she had visual perception difficulties.  Patient saw Dr. Kathyrn Sheriff, ENT physician, and patient reports that Dr. Kathyrn Sheriff felt that vestibular therapy might help  strengthen and help her if it progressed down the road. Per Dr. Maisie Fus medical record, patient has diagnosis of disequilibrium, dizziness and giddiness, Mnire's disease, and active cochleovestibular disorder.  On 06/07/2021 patient had VNG testing per medical record that revealed a 42% right unilateral weakness and abnormal smooth pursuits as well as abnormal saccades.  Patient reports that Dr. Kathyrn Sheriff thought it was central nervous system and not Mnire's disease. Patient reports that the VNG testing was intense and that it reproduced her  symptoms but that since the VNG testing her symptoms have been a little better overall. Patient describes her dizziness symptoms as a constant floating and disequilibrium sensation, "staggery", and vertigo.  Patient states she has had occasional distinct episodes of vertigo.  Patient reports the last episode of vertigo was in December 2022.  Pt reports staying still and closing her eyes it will subside in 15 minutes but if she moves it starts right back up and this lasts about a day. And then she gets increase dysequilibrium feeling for about a week until it drops back down to her baseline level of floaty feeling. Patient reports she has about 3-4 episodes of vertigo per year.  Patient reports that sometimes changes in the seasons and weather such as when a storm system is coming in that she will notice this bringing on her symptoms.  Patient states she was doing some home renovation and staining of room and noticed that this brought on symptoms.  Patient states she veers when she walks at times.  Patient reports that she has had several sinus infections and when they were treated this helped to decrease her symptoms some as well.  Patient has tried meclizine and Valium but states they did not help her dizziness symptoms and that meclizine makes her sleepy.  She reports driving is very challenging and states she has difficulty with peripheral things going by affecting her vision when in the car.  Patient states she had an episode while driving of dizziness where she got tunnel vision once and that she now limits her driving. Patient reports that driving also causes her fatigue.   Patient reports that she has conditioned herself to live differently and that she tries to avoid making sudden movements and positions where she might get unsteady.  Patient reports she walks 2 miles a day with her walking stick.  SYMPTOM BEHAVIOR:  Type of dizziness: Imbalance (Disequilibrium), Spinning/Vertigo, and Unsteady with  head/body turns Frequency: daily sensation of disequilibrium and punctuated episodes 3-4 times a year of vertigo Duration: baseline disequilibrium sensation all the time Aggravating factors: Induced by motion: bending down to the ground, turning body quickly, turning head quickly, and driving, Worse in the dark, Worse outside or in busy environment, and stress  Relieving factors: lying supine, closing eyes, rest, slow movements, and avoid busy/distracting environments Progression of symptoms: better because patient reports she has learned to adapt and avoids certain movements and positions; Reports has been a little better since having had the VNG History of similar episodes: none prior to 2016 Auditory complaints (tinnitus, pain, drainage): "crickets" "noise" at times in both ears; denies hearing loss Vision (last eye exam, diplopia, recent changes): wears eye glasses, got new glasses this year and had last visit in December.  She has noticed depth perception issues. Current Symptoms: (dysarthria, dysphagia, drop attacks, bowel and bladder changes, recent weight loss/gain)    Review of systems negative for red flags.      DIAGNOSTIC FINDINGS: per medical record, VNG revealed  43% right unilateral hypofunction  PRECAUTIONS: fall  SUBJECTIVE:  Pt is unsure if she has felt improvement. Pt reports a couple days of increased neck stiffness over the weekend. It has since improved. Neck pain can radiate down L shoulder blade at times. Pt's pain is currently 2/10, described as a tightness.    PAIN:  Are you having pain? Yes: NPRS scale: 2/10 Pain location: L side neck Pain description: tightness, radiates down to L shoulder Aggravating factors:   Relieving factors:   and        PATIENT GOALS: driving, stronger, less episodes and intensity of vertigo and dysequillibrium.  OBJECTIVE:   VESTIBULAR TREATMENT:  09/16/2021:  Manual- -Pt supine on plinth with heat applied to L shoulder: PT  provides STM while simultaneously providing UT and cervical rotation stretch for long duration bilaterally. STM is to bilateral cervical paraspinals, muscles of occipital triangle and bilat UT with focus on L side. Additional gentle suboccipital distraction performed in 4x15-20 sec bouts. -Seated tool-assisted manual therapy to L UT and L side cervical paraspinals. 2 TrPs in L UT and 1 in L cervical paraspinal muscle. TrP release provided to these regions in 30 sec intervals.   Heat removed. Skin WNL. No adverse reaction to treatment.   Therex-  Supine manually resisted chin tuck 10x 3 sec holds Manually resisted cervical lateral flexion isometric 10x3 sec holds each side. Cuing for technique. Seated thoracic ext over chair 12x.  VC for technique. Standing rows at cable machine 2.5# 2x10, 7.5# 10x. Rates medium. RTB L IR 3x12 RTB ER/pull-aparts 2x15 B  Prone Is, Ts, Ys, 10-12x for each. Significant limitations in ROM with prone Ys. Pt states, "it feels like I don't have any strength." Somewhat pain-limited.   Mini prone push-up on forearms 5x  Rationale for Evaluation and Treatment Rehabilitation   09/12/2021:    Manual- SPT assists with session and provides the following interventions while pt in supine: UT trap stretch with shoulder depression 30 sec B x 2 sets. Cervical rotation stretch with STM provided (see STM section) 2x 30 sec bilat.  STM to bilateral cervical paraspinals, muscles of occipital triangle and bilat UT, TrP release with focus on L UT, and gentle manual cervical distraction x several minutes     Therex- RTB ER 2x15 B RTB rows 3x15 B Seated cervical ext. Stretch 60 sec Seated cervical flex stretch 60 sec   Seated thoracic ext over chair 15x  Stability ball rollouts FWD/BCKWD x 2 min, instructed in holding end-range position for count of 3 sec RTB IR 2x15 each UE  RTB L shoulder flexion 15x2 sets   09/09/2021:  INTERVENTIONS -    Therex- MMT UE: Grossly  4+/5 RUE, 4/5 LUE, *Pain with resisted ER/IR on L  AROM bilat shoulder flex/ext: pain with LUE for both flex/ext, more with flexion, however ROM WNL bilat. ER on L painful but WNL ROM  ROM cervical:  Lateral flexion: 15 deg. and painful bilat  Flexion: 22 deg. Tightness felt on L side Ext: 12 deg.  Tightness felt on L side Rotation:   R- 46 deg.  L- 32 deg. And painful   Pt found to be TTP throughout L posterior shoulder musculature, L upper trap, L cervical paraspinals and in musculature of occipital triangle.   Seated UT stretch 2x30 sec bilat  YTB and RTB seated ER/pull-aparts 10x for each  Manual- PT provides STM while pt in supine bilat, with focus primarily on L posterior shoulder musculature, L UT  and L cervical paraspinals, muscles of occipital triangle. Additionally, PT brought pt through UT stretch bilat and provided gentle suboccip release. X 6 min  Rationale for Evaluation and Treatment Rehabilitation    PATIENT EDUCATION:  Education details:  Pt educated throughout session about proper posture and technique with exercises. Improved exercise technique, movement at target joints, use of target muscles after min to mod verbal, tactile cues and demo. Additionally, PT provided pt on expected progression with HEP and symptoms.  Person educated: Patient Education method: Explanation, Demonstration, Verbal cues, Tactile cues Education comprehension: verbalized understanding and demonstrated understanding  Patient Home Exercise Program:   No updates 09/16/2021  Instructed pt in HEP Access Code: [email or web address] URL: [email or web address] Date: 09/09/2021 Prepared by: Ricard Dillon  Exercises - Seated Upper Trapezius Stretch  - 1 x daily - 7 x weekly - 2 sets - 2 reps - 30 hold - Seated Bilateral Shoulder External Rotation with Resistance  - 1 x daily - 5 x weekly - 3 sets - 10 reps  Access Code: [email or web address] URL: [email or web address] Date:  09/03/2021 Prepared by: Lady Deutscher  Exercises - Pencil Pushups  - 1 x daily - 7 x weekly - 2 sets - 10 reps - Supine Chin Tuck  - 1 x daily - 7 x weekly - 1 sets - 10 reps - 3 seconds hold - Supine Cervical Retraction with Towel  - 1 x daily - 7 x weekly - 1 sets - 10 reps - 3 seconds hold - Seated Upper Trapezius Stretch  - 1 x daily - 7 x weekly - 1 sets - 3 reps - 30 seconds hold  Vestibular slides 1, 3, 4,5 and body wall rolls; VOR X1 with conflicting background with vertical and horizontal head turns.  VOR X 1 in standing horizontal Feet together and semitandem progressions standing on a pillow with horizontal and vertical head turns and body turns.  VOR X 2 in sitting 1 minute reps with plain background  GOALS: Goals reviewed with patient? Yes  SHORT TERM GOALS: Target date: 08/13/2021  Pt will be independent with HEP in order to improve balance and decrease dizziness symptoms in order to decrease fall risk and improve function at home and work. Baseline: Goal status: Achieved  LONG TERM GOALS: Target date: 10/29/2021  Patient will have improved FOTO score of 6 points or greater in order to demonstrate improvements in patient's ADLs and functional performance.  Baseline: 47/100 on 07/09/2021; scored 42/100 on 09/03/21 Goal status: ongoing  2.  Patient will report 50% or greater improvement in their symptoms of dizziness and imbalance with provoking motions or positions. Baseline: pt reports quick movements, bending over and driving bring on symptoms; on 09/03/21, pt reports that she has seen an improvement but feels the weather has impacted her over the past several days. Pt states she has driven some and she drove herself to therapy today. Pt states yesterday she did not feel well enough to drive as she had increased dizziness. Pt states she drank some Gatoraide which she felt helped. Pt reports 70% improvement overall since starting therapy.  Goal status: Achieved  3.  Patient  will have demonstrate decreased falls risk as indicated by Activities Specific Balance Confidence Scale score of 80% or greater. Baseline: 62% on 07/09/21; scored 69% on 09/03/21 Goal status: partially met  4. Patient will reduce perceived disability to low levels as indicated by <40 on Dizziness Handicap Inventory. Baseline: 52/100 on 07/09/21; 09/03/21 scored 50/100 Goal status: ongoing  5. Patient will report worst neck pain reaches no greater than a 2/10 in order to improve QOL. Baseline: worst pain currently 8-9/10 Goal status: NEW  6. The patient will demonstrate an increase of at least 10 degrees in all planes of cervical ROM to improve mobility and ease with scanning the environment with gait.    Baseline: lateral flexion 15 degrees B; flexion 22 deg.; extension 12 degrees; R rotation 46 degrees; L rotation 32 degrees Goal status: NEW    ASSESSMENT:  CLINICAL IMPRESSION:   Pt noted to have significant strength deficit with prone Y intervention, indicating impairment of strength in bilateral lower traps. Pt did note that L side felt weaker than R with some mild pain/discomfort. Large portion of session with continued focus on manual therapy primarily to L side (UT and cervical paraspinals) as this is most painful. Pt did report neck felt less stiff following interventions. The pt will benefit from further skilled PT to improve pain, strength and ROM to increase QOL and ease with mobility.   OBJECTIVE IMPAIRMENTS decreased balance, difficulty walking, and dizziness.   ACTIVITY LIMITATIONS driving and shopping.   PERSONAL FACTORS Past/current experiences, Time since onset of injury/illness/exacerbation, and 1-2 comorbidities: vestibular migraines, sleep disorder are also affecting patient's functional outcome.   REHAB POTENTIAL: Good  CLINICAL DECISION MAKING: Evolving/moderate complexity  EVALUATION COMPLEXITY: Moderate  PLAN: PT FREQUENCY: 2x/week  PT DURATION: 8  weeks  PLANNED INTERVENTIONS: Therapeutic exercises, Therapeutic activity, Canalith Repositioning, Neuromuscular re-education, Balance training, Gait training, Patient/Family education, and Joint mobilization, moist heat, Passive range of motion, Dry needling, manual techniques  PLAN FOR NEXT SESSION:   Next session plan to perform UE strengthening, stretching, manual, cervical isometrics   Ricard Dillon PT, DPT  Zollie Pee, PT 09/16/2021, 1:13 PM        [place] MAIN [facility] SERVICES [address] [place], [place], [address] Phone: [phone]   Fax:  [phone]  Patient Details  Name: Natalie [name] MRN: [id number] Date of Birth: [birth year] Referring Provider:  Margaretha Sheffield, MD  Encounter Date: 09/16/2021   Zollie Pee, PT 09/16/2021, 1:13 PM  [place] [facility]IN [facility] SERVICES [address] [place], [place], [address] Phone: [phone]   Fax:  [phone]

## 2021-09-20 ENCOUNTER — Ambulatory Visit: Payer: No Typology Code available for payment source

## 2021-09-20 DIAGNOSIS — M6281 Muscle weakness (generalized): Secondary | ICD-10-CM

## 2021-09-20 DIAGNOSIS — M542 Cervicalgia: Secondary | ICD-10-CM | POA: Diagnosis not present

## 2021-09-20 DIAGNOSIS — R42 Dizziness and giddiness: Secondary | ICD-10-CM

## 2021-09-20 NOTE — Therapy (Signed)
OUTPATIENT PHYSICAL THERAPY TREATMENT NOTE      Patient Name: Natalie [name]    MRN: [id number] DOB:November 16, 1955, 66 y.o., female Today's Date: 09/20/2021  PCP: Glean Hess, MD REFERRING PROVIDER: Margaretha Sheffield, MD  END OF SESSION:   PT End of Session - 09/20/21 1022     Visit Number 12    Number of Visits 25    Date for PT Re-Evaluation 10/29/21    Authorization Type Eval performed on 07/09/21    PT Start Time 9381    PT Stop Time 0931    PT Time Calculation (min) 44 min    Equipment Utilized During Treatment --    Activity Tolerance Patient tolerated treatment well;No increased pain    Behavior During Therapy [facility] for tasks assessed/performed                 Past Medical History:  Diagnosis Date   Benign neoplasm of sigmoid colon    Chronic constipation    Chronic sinusitis    Cystitis    Diverticulitis    Perforated bowel (HCC)    Pleural effusion on right 2016   Preventative health care 05/24/2015   Past Surgical History:  Procedure Laterality Date   BREAST BIOPSY Right 03/15/2019   stereo biopsy/ x clip/ benign   COLONOSCOPY WITH PROPOFOL N/A 07/27/2015   Procedure: COLONOSCOPY WITH PROPOFOL;  Surgeon: Lucilla Lame, MD;  Location: [place];  Service: Endoscopy;  Laterality: N/A;   COLONOSCOPY WITH PROPOFOL N/A 03/14/2016   Procedure: COLONOSCOPY WITH PROPOFOL;  Surgeon: Lucilla Lame, MD;  Location: [place];  Service: Endoscopy;  Laterality: N/A;  Cannot arrive before 0830 AM   COLOSTOMY REVERSAL N/A 09/26/2015   Procedure: COLOSTOMY REVERSAL / TAKE DOWN;  Surgeon: Hubbard Robinson, MD;  Location: [facility];  Service: General;  Laterality: N/A;   LAPAROTOMY N/A 03/01/2015   Procedure: EXPLORATORY LAPAROTOMY, SIGMOID COLECTOMY, COLOSTOMY;  Surgeon: Hubbard Robinson, MD;  Location: [facility];  Service: General;  Laterality: N/A;   POLYPECTOMY  03/14/2016   Procedure: POLYPECTOMY;  Surgeon: Lucilla Lame, MD;  Location: [place];  Service: Endoscopy;;   Patient Active Problem List   Diagnosis Date Noted   Dizziness, nonspecific 07/31/2020   Periodic limb movement sleep disorder 03/14/2020   Osteopenia determined by x-ray 09/22/2019   Elevated LDL cholesterol level 07/18/2019   Vestibular migraine 11/27/2017   Hx of diverticulitis of colon    Benign neoplasm of ascending colon    History of colostomy reversal   ONSET DATE: 2016 after surgery  REFERRING DIAG: R42 dysequilibrium  THERAPY DIAG:  Cervicalgia  Muscle weakness (generalized)  Dizziness and giddiness  PERTINENT HISTORY:  Patient reports that she began to have dizziness in 2016 after she had a bout of sepsis and underwent surgery where she needed to have a colostomy and a chest tube.  Patient reports after the surgery she began having equilibrium issues.  Patient reports she did receive ototoxic medications such as gentamicin after the surgery.  Patient states she saw Dr. Brigitte Pulse, neurologist, in regards to her dizziness symptoms.  Patient states Dr. Brigitte Pulse felt that she did not fit the classic category for vestibular migraines.  Then, patient had a work-up for Mnire's disease which Dr. Freddy Jaksch been felt she did not have per patient report.  Patient reports that he said she had visual perception difficulties.  Patient saw Dr. Kathyrn Sheriff, ENT physician, and patient reports that Dr. Kathyrn Sheriff felt that vestibular therapy  might help strengthen and help her if it progressed down the road. Per Dr. Maisie Fus medical record, patient has diagnosis of disequilibrium, dizziness and giddiness, Mnire's disease, and active cochleovestibular disorder.  On 06/07/2021 patient had VNG testing per medical record that revealed a 42% right unilateral weakness and abnormal smooth pursuits as well as abnormal saccades.  Patient reports that Dr. Kathyrn Sheriff thought it was central nervous system and not Mnire's disease. Patient reports that the VNG testing was intense and that it  reproduced her symptoms but that since the VNG testing her symptoms have been a little better overall. Patient describes her dizziness symptoms as a constant floating and disequilibrium sensation, "staggery", and vertigo.  Patient states she has had occasional distinct episodes of vertigo.  Patient reports the last episode of vertigo was in December 2022.  Pt reports staying still and closing her eyes it will subside in 15 minutes but if she moves it starts right back up and this lasts about a day. And then she gets increase dysequilibrium feeling for about a week until it drops back down to her baseline level of floaty feeling. Patient reports she has about 3-4 episodes of vertigo per year.  Patient reports that sometimes changes in the seasons and weather such as when a storm system is coming in that she will notice this bringing on her symptoms.  Patient states she was doing some home renovation and staining of room and noticed that this brought on symptoms.  Patient states she veers when she walks at times.  Patient reports that she has had several sinus infections and when they were treated this helped to decrease her symptoms some as well.  Patient has tried meclizine and Valium but states they did not help her dizziness symptoms and that meclizine makes her sleepy.  She reports driving is very challenging and states she has difficulty with peripheral things going by affecting her vision when in the car.  Patient states she had an episode while driving of dizziness where she got tunnel vision once and that she now limits her driving. Patient reports that driving also causes her fatigue.   Patient reports that she has conditioned herself to live differently and that she tries to avoid making sudden movements and positions where she might get unsteady.  Patient reports she walks 2 miles a day with her walking stick.  SYMPTOM BEHAVIOR:  Type of dizziness: Imbalance (Disequilibrium), Spinning/Vertigo, and  Unsteady with head/body turns Frequency: daily sensation of disequilibrium and punctuated episodes 3-4 times a year of vertigo Duration: baseline disequilibrium sensation all the time Aggravating factors: Induced by motion: bending down to the ground, turning body quickly, turning head quickly, and driving, Worse in the dark, Worse outside or in busy environment, and stress  Relieving factors: lying supine, closing eyes, rest, slow movements, and avoid busy/distracting environments Progression of symptoms: better because patient reports she has learned to adapt and avoids certain movements and positions; Reports has been a little better since having had the VNG History of similar episodes: none prior to 2016 Auditory complaints (tinnitus, pain, drainage): "crickets" "noise" at times in both ears; denies hearing loss Vision (last eye exam, diplopia, recent changes): wears eye glasses, got new glasses this year and had last visit in December.  She has noticed depth perception issues. Current Symptoms: (dysarthria, dysphagia, drop attacks, bowel and bladder changes, recent weight loss/gain)    Review of systems negative for red flags.      DIAGNOSTIC FINDINGS: per medical record,  VNG revealed 43% right unilateral hypofunction  PRECAUTIONS: fall  SUBJECTIVE:   Pt reports some back soreness following last appointment that lasted less than 24 hours. Neck is a little stiff today. Pt reports night pain has decreased from 8/10 to 6/10. Currently pain is 3/10. Pt granddaughter is coming over tonight for supper. Pt reports her dizziness has not been as bad over the past week.   PAIN:  Are you having pain? Yes: NPRS scale: 3/10 Pain location: L side neck Pain description: tightness, continues to radiates down to L shoulder Aggravating factors:   Relieving factors:   and        PATIENT GOALS: driving, stronger, less episodes and intensity of vertigo and dysequillibrium.  OBJECTIVE:   VESTIBULAR  TREATMENT:  09/20/2021:  Manual- Prone: With assessment pt TTP throughout C6-T6, most tender C6-T3. Grade I-II CPA mobilizations provided in 2x20-30 sec bouts C6-T6, followed by TrP release to bilat UT with pt still in prone x several minutes. Pt reports improvement in pain following manual.  TherEx- Prone Is, Ts,1x12 1x15 for each. Prone Ys 1x12, 1x8. Pt states intervention is "a little hard." Pt reports feeling limited with intervention due to weakness and stiffness.  Prone press up on forearms 3x6. TC for technique. Noted increased ext. through lumbar spine, limited through thoracic.   Seated, table supported (UE) stretch into thoracic ext x 60 sec  Seated thoracic ext over chair 10x.  VC/Demo for technique.  Seated chin tucks with manual resistance 10x3 sec holds. Continued VC/TC/demo for technique  UT stretch 2x30 sec B  Cervical rotation stretch 2x30 sec B  Standing rows at cable machine 7.5# 2x15. Rates easy.  GTB ER/pull-aparts 2x15 B   09/16/2021:  Manual- -Pt supine on plinth with heat applied to L shoulder: PT provides STM while simultaneously providing UT and cervical rotation stretch for long duration bilaterally. STM is to bilateral cervical paraspinals, muscles of occipital triangle and bilat UT with focus on L side. Additional gentle suboccipital distraction performed in 4x15-20 sec bouts. -Seated tool-assisted manual therapy to L UT and L side cervical paraspinals. 2 TrPs in L UT and 1 in L cervical paraspinal muscle. TrP release provided to these regions in 30 sec intervals.   Heat removed. Skin WNL. No adverse reaction to treatment.   Therex-  Supine manually resisted chin tuck 10x 3 sec holds Manually resisted cervical lateral flexion isometric 10x3 sec holds each side. Cuing for technique. Seated thoracic ext over chair 12x.  VC for technique. Standing rows at cable machine 2.5# 2x10, 7.5# 10x. Rates medium. RTB L IR 3x12 RTB ER/pull-aparts 2x15  B  Prone Is, Ts, Ys, 10-12x for each. Significant limitations in ROM with prone Ys. Pt states, "it feels like I don't have any strength." Somewhat pain-limited.   Mini prone push-up on forearms 5x  Rationale for Evaluation and Treatment Rehabilitation   09/12/2021:    Manual- SPT assists with session and provides the following interventions while pt in supine: UT trap stretch with shoulder depression 30 sec B x 2 sets. Cervical rotation stretch with STM provided (see STM section) 2x 30 sec bilat.  STM to bilateral cervical paraspinals, muscles of occipital triangle and bilat UT, TrP release with focus on L UT, and gentle manual cervical distraction x several minutes     Therex- RTB ER 2x15 B RTB rows 3x15 B Seated cervical ext. Stretch 60 sec Seated cervical flex stretch 60 sec   Seated thoracic ext over chair 15x  Stability  ball rollouts FWD/BCKWD x 2 min, instructed in holding end-range position for count of 3 sec RTB IR 2x15 each UE  RTB L shoulder flexion 15x2 sets   PATIENT EDUCATION:  Education details:  Pt educated throughout session about proper posture and technique with exercises. Improved exercise technique, movement at target joints, use of target muscles after min to mod verbal, tactile cues and demo. Further cuing specifically for technique with chin tucks Person educated: Patient Education method: Explanation, Demonstration, Verbal cues, Tactile cues Education comprehension: verbalized understanding and demonstrated understanding  Patient Home Exercise Program:   No updates 09/20/2021  Instructed pt in HEP Access Code: [email or web address] URL: [email or web address] Date: 09/09/2021 Prepared by: Ricard Dillon  Exercises - Seated Upper Trapezius Stretch  - 1 x daily - 7 x weekly - 2 sets - 2 reps - 30 hold - Seated Bilateral Shoulder External Rotation with Resistance  - 1 x daily - 5 x weekly - 3 sets - 10 reps  Access Code: [email or web address] URL:  [email or web address] Date: 09/03/2021 Prepared by: Lady Deutscher  Exercises - Pencil Pushups  - 1 x daily - 7 x weekly - 2 sets - 10 reps - Supine Chin Tuck  - 1 x daily - 7 x weekly - 1 sets - 10 reps - 3 seconds hold - Supine Cervical Retraction with Towel  - 1 x daily - 7 x weekly - 1 sets - 10 reps - 3 seconds hold - Seated Upper Trapezius Stretch  - 1 x daily - 7 x weekly - 1 sets - 3 reps - 30 seconds hold  Vestibular slides 1, 3, 4,5 and body wall rolls; VOR X1 with conflicting background with vertical and horizontal head turns.  VOR X 1 in standing horizontal Feet together and semitandem progressions standing on a pillow with horizontal and vertical head turns and body turns.  VOR X 2 in sitting 1 minute reps with plain background  GOALS: Goals reviewed with patient? Yes  SHORT TERM GOALS: Target date: 08/13/2021  Pt will be independent with HEP in order to improve balance and decrease dizziness symptoms in order to decrease fall risk and improve function at home and work. Baseline: Goal status: Achieved  LONG TERM GOALS: Target date: 10/29/2021  Patient will have improved FOTO score of 6 points or greater in order to demonstrate improvements in patient's ADLs and functional performance.  Baseline: 47/100 on 07/09/2021; scored 42/100 on 09/03/21 Goal status: ongoing  2.  Patient will report 50% or greater improvement in their symptoms of dizziness and imbalance with provoking motions or positions. Baseline: pt reports quick movements, bending over and driving bring on symptoms; on 09/03/21, pt reports that she has seen an improvement but feels the weather has impacted her over the past several days. Pt states she has driven some and she drove herself to therapy today. Pt states yesterday she did not feel well enough to drive as she had increased dizziness. Pt states she drank some Gatoraide which she felt helped. Pt reports 70% improvement overall since starting  therapy.  Goal status: Achieved  3.  Patient will have demonstrate decreased falls risk as indicated by Activities Specific Balance Confidence Scale score of 80% or greater. Baseline: 62% on 07/09/21; scored 69% on 09/03/21 Goal status: partially met  4. Patient will reduce perceived disability to low levels as indicated by <40 on Dizziness Handicap Inventory. Baseline: 52/100 on 07/09/21; 09/03/21 scored 50/100 Goal status: ongoing   5. Patient will report worst neck pain reaches  no greater than a 2/10 in order to improve QOL. Baseline: worst pain currently 8-9/10 Goal status: NEW  6. The patient will demonstrate an increase of at least 10 degrees in all planes of cervical ROM to improve mobility and ease with scanning the environment with gait.    Baseline: lateral flexion 15 degrees B; flexion 22 deg.; extension 12 degrees; R rotation 46 degrees; L rotation 32 degrees Goal status: NEW    ASSESSMENT:  CLINICAL IMPRESSION:   Pt presents with reports of improved dizziness over the past week, however, still with neck stiffness and pain. At beginning of session PT provided CPA grade I-II mobilizations as pt continues to be TTP primarily throughout lower cervical spine and upper thoracic spine. Pt did report pain improvement with intervention. Following mobilizations pt demonstrated improved AROM with prone Ys and was able to complete more repetitions compared to previous session. However, this is still an area of weakness and discomfort. Pt still noted to have stiffness and ROM limitations with all thoracic and cervical mobility interventions. The pt will benefit from further skilled PT to improve pain, strength, and ROM to increase QOL and cervical related symptoms.    OBJECTIVE IMPAIRMENTS decreased balance, difficulty walking, and dizziness.   ACTIVITY LIMITATIONS driving and shopping.   PERSONAL FACTORS Past/current experiences, Time since onset of injury/illness/exacerbation, and 1-2  comorbidities: vestibular migraines, sleep disorder are also affecting patient's functional outcome.   REHAB POTENTIAL: Good  CLINICAL DECISION MAKING: Evolving/moderate complexity  EVALUATION COMPLEXITY: Moderate  PLAN: PT FREQUENCY: 2x/week  PT DURATION: 8 weeks  PLANNED INTERVENTIONS: Therapeutic exercises, Therapeutic activity, Canalith Repositioning, Neuromuscular re-education, Balance training, Gait training, Patient/Family education, and Joint mobilization, moist heat, Passive range of motion, Dry needling, manual techniques  PLAN FOR NEXT SESSION:   Next session plan to perform UE strengthening, stretching, manual, cervical isometrics   Ricard Dillon PT, DPT  Rationale for Evaluation and Treatment Rehabilitation  Zollie Pee, PT 09/20/2021, 10:24 AM        [facility] [address] [place], [place], [address] Phone: [phone]   Fax:  [phone]  Patient Details  Name: Natalie [name] MRN: [id number] Date of Birth: 02-12-1956 Referring Provider:  Margaretha Sheffield, MD  Encounter Date: 09/20/2021   Zollie Pee, PT 09/20/2021, 10:24 AM  [facility] [address] [place], [place], [address] Phone: [phone]   Fax:  [facility] [address] [place], [place], [address] Phone: [phone]   Fax:  [phone]  Patient Details  Name: Natalie [name] MRN: [id number] Date of Birth: 06/03/1955 Referring Provider:  Margaretha Sheffield, MD  Encounter Date: 09/20/2021   Zollie Pee, PT 09/20/2021, 10:24 AM  [facility] [address] [place], [place], [address] Phone: [phone]   Fax:  [phone]

## 2021-09-24 ENCOUNTER — Encounter: Payer: Medicare HMO | Admitting: Physical Therapy

## 2021-09-27 ENCOUNTER — Ambulatory Visit: Payer: No Typology Code available for payment source

## 2021-09-27 DIAGNOSIS — R42 Dizziness and giddiness: Secondary | ICD-10-CM

## 2021-09-27 DIAGNOSIS — M542 Cervicalgia: Secondary | ICD-10-CM | POA: Diagnosis not present

## 2021-09-27 DIAGNOSIS — M6281 Muscle weakness (generalized): Secondary | ICD-10-CM

## 2021-11-04 ENCOUNTER — Ambulatory Visit (INDEPENDENT_AMBULATORY_CARE_PROVIDER_SITE_OTHER): Payer: No Typology Code available for payment source | Admitting: Gastroenterology

## 2021-11-04 ENCOUNTER — Encounter: Payer: Self-pay | Admitting: Gastroenterology

## 2021-11-04 VITALS — BP 127/77 | HR 74 | Temp 98.2°F | Ht 64.5 in | Wt 166.0 lb

## 2021-11-04 DIAGNOSIS — Z86010 Personal history of colonic polyps: Secondary | ICD-10-CM

## 2021-11-04 NOTE — Progress Notes (Signed)
Gastroenterology Consultation  Referring Provider:     Glean Hess, MD Primary Care Physician:  Glean Hess, MD Primary Gastroenterologist:  Dr. Allen Norris     Reason for Consultation:     History of adenomatous polyps        HPI:   Natalie [name] is a 66 y.o. y/o female referred for consultation & management of history of adenomatous polyps by Dr. Army Melia, Jesse Sans, MD. This patient comes in today with a report of having many questions about proceeding with a colonoscopy.  The patient had a colonoscopy by me back in 2017 with a polyp removed that was a tubular adenoma.  The patient reports that she took MoviPrep last time and was very sick and felt dehydrated before the procedure.  The patient denies any abdominal pain black stools or bloody stools the present time.  Past Medical History:  Diagnosis Date   Benign neoplasm of sigmoid colon    Chronic constipation    Chronic sinusitis    Cystitis    Diverticulitis    Perforated bowel (HCC)    Pleural effusion on right 2016   Preventative health care 05/24/2015    Past Surgical History:  Procedure Laterality Date   BREAST BIOPSY Right 03/15/2019   stereo biopsy/ x clip/ benign   COLONOSCOPY WITH PROPOFOL N/A 07/27/2015   Procedure: COLONOSCOPY WITH PROPOFOL;  Surgeon: Lucilla Lame, MD;  Location: [facility];  Service: Endoscopy;  Laterality: N/A;   COLONOSCOPY WITH PROPOFOL N/A 03/14/2016   Procedure: COLONOSCOPY WITH PROPOFOL;  Surgeon: Lucilla Lame, MD;  Location: [facility];  Service: Endoscopy;  Laterality: N/A;  Cannot arrive before 0830 AM   COLOSTOMY REVERSAL N/A 09/26/2015   Procedure: COLOSTOMY REVERSAL / TAKE DOWN;  Surgeon: Hubbard Robinson, MD;  Location: [facility];  Service: General;  Laterality: N/A;   LAPAROTOMY N/A 03/01/2015   Procedure: EXPLORATORY LAPAROTOMY, SIGMOID COLECTOMY, COLOSTOMY;  Surgeon: Hubbard Robinson, MD;  Location: [facility];  Service: General;  Laterality: N/A;    POLYPECTOMY  03/14/2016   Procedure: POLYPECTOMY;  Surgeon: Lucilla Lame, MD;  Location: [facility] [facility];  Service: Endoscopy;;    Prior to Admission medications   Medication Sig Start Date End Date Taking? Authorizing Provider  CALCIUM 600 1500 (600 Ca) MG TABS tablet  03/13/21   [provider]  cholecalciferol (VITAMIN D) 1000 units tablet Take 1,000 Units by mouth daily.    [provider]  cyanocobalamin 1000 MCG tablet Take 1,000 mcg by mouth daily.    [provider]  diazepam (VALIUM) 2 MG tablet Take 1 tablet (2 mg total) by mouth every 6 (six) hours as needed (vertigo). 12/11/20   Glean Hess, MD  MAGNESIUM PO Take by mouth.    [provider]  melatonin 3 MG TABS tablet Take 3 mg by mouth at bedtime.    [provider]  mometasone (NASONEX) 50 MCG/ACT nasal spray Place 2 sprays into the nose daily. 02/14/20   Glean Hess, MD  Multiple Vitamin (MULTIVITAMIN) tablet Take 1 tablet by mouth daily.    [provider]  polyethylene glycol (MIRALAX / GLYCOLAX) packet Take 17 g by mouth daily.    [provider]  vitamin C (ASCORBIC ACID) 500 MG tablet Take 500 mg by mouth daily.    [provider]    Family History  Problem Relation Age of Onset   Stroke Mother    Arthritis Father  Lung cancer Father    Colon cancer Maternal Aunt    Breast cancer Paternal Aunt    Heart attack Brother      Social History   Tobacco Use   Smoking status: Never   Smokeless tobacco: Never  Vaping Use   Vaping Use: Never used  Substance Use Topics   Alcohol use: No    Alcohol/week: 0.0 standard drinks of alcohol   Drug use: No    Allergies as of 11/04/2021 - Review Complete 09/27/2021  Allergen Reaction Noted   Codeine Nausea Only 12/20/2014   Doxycycline Nausea And Vomiting 02/14/2020   Levofloxacin Other (See Comments) 03/22/2015   Effexor [venlafaxine] Nausea Only 12/28/2017   Sulfa antibiotics Rash  02/24/2014   Topamax [topiramate] Other (See Comments) 12/28/2017    Review of Systems:    All systems reviewed and negative except where noted in HPI.   Physical Exam:  There were no vitals taken for this visit. No LMP recorded. Patient is postmenopausal. General:   Alert,  Well-developed, well-nourished, pleasant and cooperative in NAD Head:  Normocephalic and atraumatic. Eyes:  Sclera clear, no icterus.   Conjunctiva pink. Ears:  Normal auditory acuity. Neurologic:  Alert and oriented x3;  grossly normal neurologically. Lymph Nodes:  No significant cervical adenopathy. Psych:  Alert and cooperative. Normal mood and affect.  Imaging Studies: No results found.  Assessment and Plan:   [name] is a 66 y.o. y/o female who comes in today with a history of having adenomatous polyps and is due for repeat colonoscopy.  The patient has many questions about the procedure including concerns about dehydration since she felt poorly after having her last procedure when she took [place].  The patient has been reassured that the preps are isotonic meaning that they do not have major shifts in fluids or electrolytes.  The patient has been given a sample of Clenpiq for her next colonoscopy and will be set up for the procedure due to her history of colon polyp.  The patient has been explained the plan and agrees with it.    Lucilla Lame, MD. Marval Regal    Note: This dictation was prepared with Dragon dictation along with smaller phrase technology. Any transcriptional errors that result from this process are unintentional.

## 2021-11-18 ENCOUNTER — Encounter: Payer: Self-pay | Admitting: Gastroenterology

## 2021-11-18 NOTE — Anesthesia Preprocedure Evaluation (Addendum)
Anesthesia Evaluation  Patient identified by MRN, date of birth, ID band Patient awake    Reviewed: Allergy & Precautions, NPO status , Patient's Chart, lab work & pertinent test results  Airway Mallampati: III  TM Distance: >3 FB Neck ROM: full    Dental  (+) Partial Lower   Pulmonary shortness of breath and with exertion,    Pulmonary exam normal        Cardiovascular Exercise Tolerance: Good negative cardio ROS Normal cardiovascular exam     Neuro/Psych Vestibular migraine negative neurological ROS  negative psych ROS   GI/Hepatic negative GI ROS, Neg liver ROS,   Endo/Other  negative endocrine ROS  Renal/GU negative Renal ROS  negative genitourinary   Musculoskeletal   Abdominal Normal abdominal exam  (+)   Peds  Hematology negative hematology ROS (+)   Anesthesia Other Findings Past Medical History: No date: Benign neoplasm of sigmoid colon No date: Chronic constipation No date: Chronic sinusitis No date: Cystitis No date: Diverticulitis No date: Perforated bowel ([place]) 2016: Pleural effusion on right 05/24/2015: Preventative health care  Past Surgical History: 03/15/2019: BREAST BIOPSY; Right     Comment:  stereo biopsy/ x clip/ benign 07/27/2015: COLONOSCOPY WITH PROPOFOL; N/A     Comment:  Procedure: COLONOSCOPY WITH PROPOFOL;  Surgeon: Lucilla Lame, MD;  Location: [place];  Service:               Endoscopy;  Laterality: N/A; 03/14/2016: COLONOSCOPY WITH PROPOFOL; N/A     Comment:  Procedure: COLONOSCOPY WITH PROPOFOL;  Surgeon: Lucilla Lame, MD;  Location: [place];  Service:               Endoscopy;  Laterality: N/A;  Cannot arrive before 0830               AM 09/26/2015: COLOSTOMY REVERSAL; N/A     Comment:  Procedure: COLOSTOMY REVERSAL / TAKE DOWN;  Surgeon:               Hubbard Robinson, MD;  Location: [facility];  Service:                General;  Laterality: N/A; 03/01/2015: LAPAROTOMY; N/A     Comment:  Procedure: EXPLORATORY LAPAROTOMY, SIGMOID COLECTOMY,               COLOSTOMY;  Surgeon: Hubbard Robinson, MD;  Location:               [facility];  Service: General;  Laterality: N/A; 03/14/2016: POLYPECTOMY     Comment:  Procedure: POLYPECTOMY;  Surgeon: Lucilla Lame, MD;                Location: [place];  Service: Endoscopy;;     Reproductive/Obstetrics negative OB ROS                            Anesthesia Physical Anesthesia Plan  ASA: 2  Anesthesia Plan: General   Post-op Pain Management: Minimal or no pain anticipated   Induction: Intravenous  PONV Risk Score and Plan: Propofol infusion and TIVA  Airway Management Planned: Natural Airway  Additional Equipment:   Intra-op Plan:   Post-operative Plan:   Informed Consent: I have reviewed the patients History and Physical, chart, labs  and discussed the procedure including the risks, benefits and alternatives for the proposed anesthesia with the patient or authorized representative who has indicated his/her understanding and acceptance.     Dental Advisory Given  Plan Discussed with: Anesthesiologist, CRNA and Surgeon  Anesthesia Plan Comments:        Anesthesia Quick Evaluation

## 2021-11-22 ENCOUNTER — Ambulatory Visit
Admission: RE | Admit: 2021-11-22 | Discharge: 2021-11-22 | Disposition: A | Discharge status: Home / Self Care | Payer: No Typology Code available for payment source | Attending: Gastroenterology | Admitting: Gastroenterology

## 2021-11-22 ENCOUNTER — Encounter
Admission: RE | Disposition: A | Discharge status: Home / Self Care | Payer: Self-pay | Source: Home / Self Care | Attending: Gastroenterology

## 2021-11-22 ENCOUNTER — Encounter: Payer: Self-pay | Admitting: Gastroenterology

## 2021-11-22 ENCOUNTER — Ambulatory Visit (AMBULATORY_SURGERY_CENTER): Payer: No Typology Code available for payment source | Admitting: Anesthesiology

## 2021-11-22 ENCOUNTER — Ambulatory Visit: Payer: No Typology Code available for payment source | Admitting: Anesthesiology

## 2021-11-22 ENCOUNTER — Other Ambulatory Visit: Payer: Self-pay

## 2021-11-22 DIAGNOSIS — J329 Chronic sinusitis, unspecified: Secondary | ICD-10-CM | POA: Diagnosis not present

## 2021-11-22 DIAGNOSIS — K635 Polyp of colon: Secondary | ICD-10-CM | POA: Diagnosis not present

## 2021-11-22 DIAGNOSIS — K640 First degree hemorrhoids: Secondary | ICD-10-CM | POA: Diagnosis not present

## 2021-11-22 DIAGNOSIS — D122 Benign neoplasm of ascending colon: Secondary | ICD-10-CM | POA: Insufficient documentation

## 2021-11-22 DIAGNOSIS — Z860100 Personal history of colon polyps, unspecified: Secondary | ICD-10-CM

## 2021-11-22 DIAGNOSIS — Z1211 Encounter for screening for malignant neoplasm of colon: Secondary | ICD-10-CM | POA: Diagnosis not present

## 2021-11-22 DIAGNOSIS — D128 Benign neoplasm of rectum: Secondary | ICD-10-CM | POA: Diagnosis not present

## 2021-11-22 HISTORY — DX: Presence of dental prosthetic device (complete) (partial): Z97.2

## 2021-11-22 HISTORY — PX: POLYPECTOMY: SHX5525

## 2021-11-22 HISTORY — PX: COLONOSCOPY: SHX5424

## 2021-11-22 SURGERY — COLONOSCOPY
Anesthesia: General | Site: Rectum

## 2021-11-22 MED ORDER — PROPOFOL 10 MG/ML IV BOLUS
INTRAVENOUS | Status: DC | PRN
  Administered 2021-11-22: 100 mg via INTRAVENOUS
  Administered 2021-11-22 (×2): 50 mg via INTRAVENOUS

## 2021-11-22 MED ORDER — SODIUM CHLORIDE 0.9 % IV SOLN: INTRAVENOUS | Status: DC

## 2021-11-22 MED ORDER — LACTATED RINGERS IV SOLN: INTRAVENOUS | Status: DC

## 2021-11-22 MED ORDER — LIDOCAINE HCL (CARDIAC) PF 100 MG/5ML IV SOSY
PREFILLED_SYRINGE | INTRAVENOUS | Status: DC | PRN
  Administered 2021-11-22: 40 mg via INTRAVENOUS

## 2021-11-22 SURGICAL SUPPLY — 21 items

## 2021-11-22 NOTE — Op Note (Signed)
[facility] Gastroenterology Patient Name: Natalie [name] Procedure Date: 11/22/2021 7:17 AM MRN: [id number] Account #: [id number] Date of Birth: [birth year] Admit Type: Outpatient Age: 66 Room: [facility] OR ROOM 01 Gender: Female Note Status: Finalized Instrument Name: [id number] Procedure:             Colonoscopy Indications:           High risk colon cancer surveillance: Personal history                         of colonic polyps Providers:             Lucilla Lame MD, MD Referring MD:          Halina Maidens, MD (Referring MD) Medicines:             Propofol per Anesthesia Complications:         No immediate complications. Procedure:             Pre-Anesthesia Assessment:                        - Prior to the procedure, a History and Physical was                         performed, and patient medications and allergies were                         reviewed. The patient's tolerance of previous                         anesthesia was also reviewed. The risks and benefits                         of the procedure and the sedation options and risks                         were discussed with the patient. All questions were                         answered, and informed consent was obtained. Prior                         Anticoagulants: The patient has taken no previous                         anticoagulant or antiplatelet agents. ASA Grade                         Assessment: II - A patient with mild systemic disease.                         After reviewing the risks and benefits, the patient                         was deemed in satisfactory condition to undergo the                         procedure.  After obtaining informed consent, the colonoscope was                         passed under direct vision. Throughout the procedure,                         the patient's blood pressure, pulse, and oxygen                         saturations were monitored  continuously. The                         Colonoscope was introduced through the anus and                         advanced to the the cecum, identified by appendiceal                         orifice and ileocecal valve. The colonoscopy was                         performed without difficulty. The patient tolerated                         the procedure well. The quality of the bowel                         preparation was excellent. Findings:      The perianal and digital rectal examinations were normal.      A 3 mm polyp was found in the ascending colon. The polyp was sessile.       The polyp was removed with a cold biopsy forceps. Resection and       retrieval were complete.      A 3 mm polyp was found in the rectum. The polyp was sessile. The polyp       was removed with a cold biopsy forceps. Resection and retrieval were       complete.      There was evidence of a prior end-to-side colo-colonic anastomosis in       the sigmoid colon. This was patent and was characterized by healthy       appearing mucosa.      A few small-mouthed diverticula were found in the entire colon.      Non-bleeding internal hemorrhoids were found during retroflexion. The       hemorrhoids were Grade I (internal hemorrhoids that do not prolapse). Impression:            - One 3 mm polyp in the ascending colon, removed with                         a cold biopsy forceps. Resected and retrieved.                        - One 3 mm polyp in the rectum, removed with a cold                         biopsy forceps. Resected and retrieved.                        -  Patent end-to-side colo-colonic anastomosis,                         characterized by healthy appearing mucosa.                        - Diverticulosis in the entire examined colon.                        - Non-bleeding internal hemorrhoids. Recommendation:        - Discharge patient to home.                        - Resume previous diet.                         - Continue present medications.                        - Await pathology results.                        - Repeat colonoscopy in 7 years for surveillance. Procedure Code(s):     --- Professional ---                        [phone], Colonoscopy, flexible; with biopsy, single or                         multiple Diagnosis Code(s):     --- Professional ---                        Z86.010, Personal history of colonic polyps                        K63.5, Polyp of colon CPT copyright 2019 American Medical Association. All rights reserved. The codes documented in this report are preliminary and upon coder review may  be revised to meet current compliance requirements. Lucilla Lame MD, MD 11/22/2021 7:46:23 AM This report has been signed electronically. Number of Addenda: 0 Note Initiated On: 11/22/2021 7:17 AM Scope Withdrawal Time: 0 hours 7 minutes 10 seconds  Total Procedure Duration: 0 hours 8 minutes 43 seconds  Estimated Blood Loss:  Estimated blood loss: none.      [facility]

## 2021-11-22 NOTE — Transfer of Care (Signed)
Immediate Anesthesia Transfer of Care Note  Patient: Natalie [name]  Procedure(s) Performed: COLONOSCOPY (Rectum) POLYPECTOMY (Rectum)  Patient Location: PACU  Anesthesia Type: General  Level of Consciousness: awake, alert  and patient cooperative  Airway and Oxygen Therapy: Patient Spontanous Breathing and Patient connected to supplemental oxygen  Post-op Assessment: Post-op Vital signs reviewed, Patient's Cardiovascular Status Stable, Respiratory Function Stable, Patent Airway and No signs of Nausea or vomiting  Post-op Vital Signs: Reviewed and stable  Complications: There were no known notable events for this encounter.

## 2021-11-22 NOTE — Anesthesia Postprocedure Evaluation (Signed)
Anesthesia Post Note  Patient: Natalie [name]  Procedure(s) Performed: COLONOSCOPY (Rectum) POLYPECTOMY (Rectum)     Patient location during evaluation: PACU Anesthesia Type: General Level of consciousness: awake and alert Pain management: pain level controlled Vital Signs Assessment: post-procedure vital signs reviewed and stable Respiratory status: spontaneous breathing, nonlabored ventilation and respiratory function stable Cardiovascular status: blood pressure returned to baseline and stable Postop Assessment: no apparent nausea or vomiting Anesthetic complications: no   There were no known notable events for this encounter.  [place] Ouch

## 2021-11-22 NOTE — H&P (Signed)
Lucilla Lame, MD [facility] [address] [place], [place] [address] [phone] Fax : [phone]  Primary Care Physician:  Glean Hess, MD Primary Gastroenterologist:  Dr. Allen Norris  Pre-Procedure History & Physical: HPI:  Natalie [name] is a 66 y.o. female is here for an colonoscopy.   Past Medical History:  Diagnosis Date   Benign neoplasm of sigmoid colon    Chronic constipation    Chronic sinusitis    Cystitis    Diverticulitis    Perforated bowel ([facility])    Pleural effusion on right 2016   Preventative health care 05/24/2015   Wears dentures    partial upper    Past Surgical History:  Procedure Laterality Date   BREAST BIOPSY Right 03/15/2019   stereo biopsy/ x clip/ benign   COLONOSCOPY WITH PROPOFOL N/A 07/27/2015   Procedure: COLONOSCOPY WITH PROPOFOL;  Surgeon: Lucilla Lame, MD;  Location: [place];  Service: Endoscopy;  Laterality: N/A;   COLONOSCOPY WITH PROPOFOL N/A 03/14/2016   Procedure: COLONOSCOPY WITH PROPOFOL;  Surgeon: Lucilla Lame, MD;  Location: [place];  Service: Endoscopy;  Laterality: N/A;  Cannot arrive before 0830 AM   COLOSTOMY REVERSAL N/A 09/26/2015   Procedure: COLOSTOMY REVERSAL / TAKE DOWN;  Surgeon: Hubbard Robinson, MD;  Location: [facility];  Service: General;  Laterality: N/A;   LAPAROTOMY N/A 03/01/2015   Procedure: EXPLORATORY LAPAROTOMY, SIGMOID COLECTOMY, COLOSTOMY;  Surgeon: Hubbard Robinson, MD;  Location: [facility];  Service: General;  Laterality: N/A;   POLYPECTOMY  03/14/2016   Procedure: POLYPECTOMY;  Surgeon: Lucilla Lame, MD;  Location: [place];  Service: Endoscopy;;    Prior to Admission medications   Medication Sig Start Date End Date Taking? Authorizing Provider  CALCIUM 600 1500 (600 Ca) MG TABS tablet  03/13/21  Yes [provider]  cholecalciferol (VITAMIN D) 1000 units tablet Take 1,000 Units by mouth daily.   Yes [provider]  cyanocobalamin 1000 MCG  tablet Take 1,000 mcg by mouth daily.   Yes [provider]  MAGNESIUM PO Take by mouth.   Yes [provider]  melatonin 3 MG TABS tablet Take 3 mg by mouth at bedtime.   Yes [provider]  mometasone (NASONEX) 50 MCG/ACT nasal spray Place 2 sprays into the nose daily. 02/14/20  Yes Glean Hess, MD  Multiple Vitamin (MULTIVITAMIN) tablet Take 1 tablet by mouth daily.   Yes [provider]  polyethylene glycol (MIRALAX / GLYCOLAX) packet Take 17 g by mouth daily.   Yes [provider]  vitamin C (ASCORBIC ACID) 500 MG tablet Take 500 mg by mouth daily.   Yes [provider]  diazepam (VALIUM) 2 MG tablet Take 1 tablet (2 mg total) by mouth every 6 (six) hours as needed (vertigo). Patient not taking: Reported on 11/22/2021 12/11/20   Glean Hess, MD    Allergies as of 11/04/2021 - Review Complete 11/04/2021  Allergen Reaction Noted   Codeine Nausea Only 12/20/2014   Doxycycline Nausea And Vomiting 02/14/2020   Levofloxacin Other (See Comments) 03/22/2015   Effexor [venlafaxine] Nausea Only 12/28/2017   Sulfa antibiotics Rash 02/24/2014   Topamax [topiramate] Other (See Comments) 12/28/2017    Family History  Problem Relation Age of Onset   Stroke Mother    Arthritis Father    Lung cancer Father    Colon cancer Maternal Aunt    Breast cancer Paternal Aunt    Heart attack Brother  Social History   Socioeconomic History   Marital status: Married    Spouse name: Not on file   Number of children: 0   Years of education: Not on file   Highest education level: Not on file  Occupational History   Occupation: Retired    Comment: Therapist, music  Tobacco Use   Smoking status: Never   Smokeless tobacco: Never  Vaping Use   Vaping Use: Never used  Substance and Sexual Activity   Alcohol use: No    Alcohol/week: 0.0 standard drinks of alcohol   Drug use: No   Sexual activity: Not on file  Other Topics Concern   Not on  file  Social History Narrative   Married. No children of her own but has step children   Worked for [employer] in [place]. Retired.    Is a Equities trader.   Enjoys spending time with family, reading, walking. She is raising her granddaughter.   Social Determinants of Health   Financial Resource Strain: Low Risk  (05/22/2021)   Overall Financial Resource Strain (CARDIA)    Difficulty of Paying Living Expenses: Not hard at all  Food Insecurity: No Food Insecurity (05/22/2021)   Hunger Vital Sign    Worried About Running Out of Food in the Last Year: Never true    Ran Out of Food in the Last Year: Never true  Transportation Needs: No Transportation Needs (05/22/2021)   PRAPARE - Hydrologist (Medical): No    Lack of Transportation (Non-Medical): No  Physical Activity: Insufficiently Active (05/22/2021)   Exercise Vital Sign    Days of Exercise per Week: 3 days    Minutes of Exercise per Session: 40 min  Stress: No Stress Concern Present (05/22/2021)   [place]    Feeling of Stress : Not at all  Social Connections: Moderately Integrated (05/22/2021)   Social Connection and Isolation Panel [NHANES]    Frequency of Communication with Friends and Family: More than three times a week    Frequency of Social Gatherings with Friends and Family: Twice a week    Attends Religious Services: More than 4 times per year    Active Member of [employer] or Organizations: No    Attends Archivist Meetings: Never    Marital Status: Married  Human resources officer Violence: Not At Risk (05/22/2021)   Humiliation, Afraid, Rape, and Kick questionnaire    Fear of Current or Ex-Partner: No    Emotionally Abused: No    Physically Abused: No    Sexually Abused: No    Review of Systems: See HPI, otherwise negative ROS  Physical Exam: BP 134/67   Pulse 82   Temp 97.9 F (36.6 C) (Temporal)    Resp 18   Ht 5' 4.5" (1.638 m)   Wt 72.6 kg   SpO2 98%   BMI 27.04 kg/m  General:   Alert,  pleasant and cooperative in NAD Head:  Normocephalic and atraumatic. Neck:  Supple; no masses or thyromegaly. Lungs:  Clear throughout to auscultation.    Heart:  Regular rate and rhythm. Abdomen:  Soft, nontender and nondistended. Normal bowel sounds, without guarding, and without rebound.   Neurologic:  Alert and  oriented x4;  grossly normal neurologically.  Impression/Plan: Natalie [name] is here for an colonoscopy to be performed for a history of adenomatous polyps on 2017   Risks, benefits, limitations, and alternatives regarding  colonoscopy have  been reviewed with the patient.  Questions have been answered.  All parties agreeable.   Lucilla Lame, MD  11/22/2021, 7:16 AM

## 2021-11-25 ENCOUNTER — Encounter: Payer: Self-pay | Admitting: Gastroenterology

## 2021-11-26 ENCOUNTER — Encounter: Payer: Self-pay | Admitting: Gastroenterology

## 2021-11-26 LAB — SURGICAL PATHOLOGY

## 2022-02-18 ENCOUNTER — Ambulatory Visit
Admission: RE | Admit: 2022-02-18 | Discharge: 2022-02-18 | Disposition: A | Discharge status: Home / Self Care | Payer: No Typology Code available for payment source | Source: Ambulatory Visit | Attending: Internal Medicine | Admitting: Internal Medicine

## 2022-02-18 DIAGNOSIS — M8589 Other specified disorders of bone density and structure, multiple sites: Secondary | ICD-10-CM | POA: Diagnosis not present

## 2022-02-18 DIAGNOSIS — Z13820 Encounter for screening for osteoporosis: Secondary | ICD-10-CM | POA: Diagnosis not present

## 2022-02-18 DIAGNOSIS — Z780 Asymptomatic menopausal state: Secondary | ICD-10-CM | POA: Insufficient documentation

## 2022-02-18 DIAGNOSIS — M8580 Other specified disorders of bone density and structure, unspecified site: Secondary | ICD-10-CM | POA: Insufficient documentation

## 2022-02-18 DIAGNOSIS — Z1231 Encounter for screening mammogram for malignant neoplasm of breast: Secondary | ICD-10-CM | POA: Diagnosis not present

## 2022-02-24 ENCOUNTER — Encounter: Payer: Self-pay | Admitting: Internal Medicine

## 2022-02-24 ENCOUNTER — Ambulatory Visit (INDEPENDENT_AMBULATORY_CARE_PROVIDER_SITE_OTHER): Payer: No Typology Code available for payment source | Admitting: Internal Medicine

## 2022-02-24 VITALS — BP 124/79 | HR 74 | Temp 97.7°F | Ht 64.5 in | Wt 166.0 lb

## 2022-02-24 DIAGNOSIS — J0100 Acute maxillary sinusitis, unspecified: Secondary | ICD-10-CM | POA: Diagnosis not present

## 2022-02-24 DIAGNOSIS — H1031 Unspecified acute conjunctivitis, right eye: Secondary | ICD-10-CM | POA: Diagnosis not present

## 2022-02-24 MED ORDER — AMOXICILLIN-POT CLAVULANATE 875-125 MG PO TABS: 1.0000 | ORAL_TABLET | Freq: Two times a day (BID) | ORAL | 0 refills | Status: AC

## 2022-02-24 MED ORDER — CIPROFLOXACIN HCL 0.3 % OP SOLN: 2.0000 [drp] | OPHTHALMIC | 0 refills | Status: DC

## 2022-02-24 NOTE — Progress Notes (Signed)
Date:  02/24/2022   Name:  Natalie [name]   DOB:  01-20-1956   MRN:  [id number]   Chief Complaint: Sinusitis  Sinusitis This is a new problem. Episode onset: X2 weeks. The problem has been gradually worsening since onset. There has been no fever. Her pain is at a severity of 2/10. The pain is mild. Associated symptoms include congestion, coughing, ear pain, headaches and sinus pressure. Pertinent negatives include no chills, shortness of breath or swollen glands. (Right eye had drainage. ) Past treatments include oral decongestants and nasal decongestants (saline rinse). The treatment provided mild relief.  Conjunctivitis  The current episode started 2 days ago. The onset was gradual. The problem has been unchanged. Associated symptoms include congestion, ear pain, headaches, cough, eye discharge and eye redness. Pertinent negatives include no fever, no swollen glands and no wheezing.    Lab Results  Component Value Date   NA 142 07/15/2021   K 4.3 07/15/2021   CO2 24 07/15/2021   GLUCOSE 86 07/15/2021   BUN 16 07/15/2021   CREATININE 0.85 07/15/2021   CALCIUM 9.8 07/15/2021   EGFR 75 07/15/2021   GFRNONAA >60 11/21/2020   Lab Results  Component Value Date   CHOL 226 (H) 07/15/2021   HDL 68 07/15/2021   LDLCALC 141 (H) 07/15/2021   TRIG 97 07/15/2021   CHOLHDL 3.3 07/15/2021   Lab Results  Component Value Date   TSH 2.380 07/15/2021   Lab Results  Component Value Date   HGBA1C 5.5 11/27/2017   Lab Results  Component Value Date   WBC 5.1 07/15/2021   HGB 14.1 07/15/2021   HCT 42.0 07/15/2021   MCV 96 07/15/2021   PLT 279 07/15/2021   Lab Results  Component Value Date   ALT 14 07/15/2021   AST 20 07/15/2021   ALKPHOS 92 07/15/2021   BILITOT 0.6 07/15/2021   Lab Results  Component Value Date   VD25OH 45.9 07/15/2021     Review of Systems  Constitutional:  Negative for chills, fatigue and fever.  HENT:  Positive for congestion, ear pain, postnasal  drip and sinus pressure.   Eyes:  Positive for discharge and redness.  Respiratory:  Positive for cough. Negative for chest tightness, shortness of breath and wheezing.   Cardiovascular:  Negative for chest pain and leg swelling.  Neurological:  Positive for headaches. Negative for dizziness.  Psychiatric/Behavioral:  Negative for dysphoric mood and sleep disturbance. The patient is not nervous/anxious.     Patient Active Problem List   Diagnosis Date Noted   History of colonic polyps    Polyp of ascending colon    Dizziness, nonspecific 07/31/2020   Periodic limb movement sleep disorder 03/14/2020   Osteopenia determined by x-ray 09/22/2019   Elevated LDL cholesterol level 07/18/2019   Vestibular migraine 11/27/2017   Hx of diverticulitis of colon    Benign neoplasm of ascending colon    History of colostomy reversal     Allergies  Allergen Reactions   Codeine Nausea Only   Doxycycline Nausea And Vomiting   Levofloxacin Other (See Comments)    insomnia   Effexor [Venlafaxine] Nausea Only    And dizziness   Sulfa Antibiotics Rash   Topamax [Topiramate] Other (See Comments)    sedation    Past Surgical History:  Procedure Laterality Date   BREAST BIOPSY Right 03/15/2019   stereo biopsy/ x clip/ benign   COLONOSCOPY N/A 11/22/2021   Procedure: COLONOSCOPY;  Surgeon: Lucilla Lame, MD;  Location: [place];  Service: Endoscopy;  Laterality: N/A;   COLONOSCOPY WITH PROPOFOL N/A 07/27/2015   Procedure: COLONOSCOPY WITH PROPOFOL;  Surgeon: Lucilla Lame, MD;  Location: [place];  Service: Endoscopy;  Laterality: N/A;   COLONOSCOPY WITH PROPOFOL N/A 03/14/2016   Procedure: COLONOSCOPY WITH PROPOFOL;  Surgeon: Lucilla Lame, MD;  Location: [place];  Service: Endoscopy;  Laterality: N/A;  Cannot arrive before 0830 AM   COLOSTOMY REVERSAL N/A 09/26/2015   Procedure: COLOSTOMY REVERSAL / TAKE DOWN;  Surgeon: Hubbard Robinson, MD;  Location: [facility];   Service: General;  Laterality: N/A;   LAPAROTOMY N/A 03/01/2015   Procedure: EXPLORATORY LAPAROTOMY, SIGMOID COLECTOMY, COLOSTOMY;  Surgeon: Hubbard Robinson, MD;  Location: [facility];  Service: General;  Laterality: N/A;   POLYPECTOMY  03/14/2016   Procedure: POLYPECTOMY;  Surgeon: Lucilla Lame, MD;  Location: [place];  Service: Endoscopy;;   POLYPECTOMY  11/22/2021   Procedure: POLYPECTOMY;  Surgeon: Lucilla Lame, MD;  Location: [place];  Service: Endoscopy;;    Social History   Tobacco Use   Smoking status: Never   Smokeless tobacco: Never  Vaping Use   Vaping Use: Never used  Substance Use Topics   Alcohol use: No    Alcohol/week: 0.0 standard drinks of alcohol   Drug use: No     Medication list has been reviewed and updated.  Current Meds  Medication Sig   CALCIUM 600 1500 (600 Ca) MG TABS tablet    cholecalciferol (VITAMIN D) 1000 units tablet Take 1,000 Units by mouth daily.   cyanocobalamin 1000 MCG tablet Take 1,000 mcg by mouth daily.   diazepam (VALIUM) 2 MG tablet Take 1 tablet (2 mg total) by mouth every 6 (six) hours as needed (vertigo).   fluticasone (FLONASE) 50 MCG/ACT nasal spray Place into both nostrils daily.   MAGNESIUM PO Take by mouth.   melatonin 3 MG TABS tablet Take 3 mg by mouth at bedtime.   Multiple Vitamin (MULTIVITAMIN) tablet Take 1 tablet by mouth daily.   polyethylene glycol (MIRALAX / GLYCOLAX) packet Take 17 g by mouth daily.   vitamin C (ASCORBIC ACID) 500 MG tablet Take 500 mg by mouth daily.   [DISCONTINUED] mometasone (NASONEX) 50 MCG/ACT nasal spray Place 2 sprays into the nose daily.       07/15/2021    9:55 AM 02/06/2021   11:21 AM 12/11/2020    9:51 AM 11/05/2020    1:26 PM  GAD 7 : Generalized Anxiety Score  Nervous, Anxious, on Edge 0 0 1 0  Control/stop worrying 0 0 0 0  Worry too much - different things 0 0 0 0  Trouble relaxing 0 0 1 0  Restless 0 0 0 0  Easily annoyed or irritable 0 0 0 0  Afraid -  awful might happen 0 0 0 0  Total GAD 7 Score 0 0 2 0  Anxiety Difficulty Not difficult at all Not difficult at all         07/15/2021    9:55 AM 05/22/2021   10:55 AM 02/06/2021   11:21 AM  Depression screen PHQ 2/9  Decreased Interest 0 0 0  Down, Depressed, Hopeless 0 0 0  PHQ - 2 Score 0 0 0  Altered sleeping 0  0  Tired, decreased energy 0  0  Change in appetite 0  0  Feeling bad or failure about yourself  0  0  Trouble concentrating 0  0  Moving slowly or fidgety/restless 0  0  Suicidal thoughts 0  0  PHQ-9 Score 0  0  Difficult doing work/chores Not difficult at all  Not difficult at all    BP Readings from Last 3 Encounters:  02/24/22 124/79  11/22/21 103/60  11/04/21 127/77    Physical Exam Constitutional:      Appearance: Normal appearance. She is well-developed.  HENT:     Right Ear: Ear canal and external ear normal. Tympanic membrane is not erythematous or retracted.     Left Ear: Ear canal and external ear normal. Tympanic membrane is not erythematous or retracted.     Nose:     Right Sinus: Maxillary sinus tenderness present. No frontal sinus tenderness.     Left Sinus: Maxillary sinus tenderness present. No frontal sinus tenderness.     Mouth/Throat:     Mouth: No oral lesions.     Pharynx: Uvula midline. Posterior oropharyngeal erythema present. No oropharyngeal exudate.  Eyes:     General: Lids are normal.     Extraocular Movements: Extraocular movements intact.     Comments: Increased tears right eye  Cardiovascular:     Rate and Rhythm: Normal rate and regular rhythm.     Heart sounds: Normal heart sounds.  Pulmonary:     Breath sounds: Normal breath sounds. No wheezing or rales.  Musculoskeletal:     Cervical back: Normal range of motion.  Lymphadenopathy:     Cervical: No cervical adenopathy.  Neurological:     Mental Status: She is alert and oriented to person, place, and time.     Wt Readings from Last 3 Encounters:  02/24/22 166 lb  (75.3 kg)  11/22/21 160 lb (72.6 kg)  11/04/21 166 lb (75.3 kg)    BP 124/79   Pulse 74   Temp 97.7 F (36.5 C) (Oral)   Ht 5' 4.5" (1.638 m)   Wt 166 lb (75.3 kg)   SpO2 96%   BMI 28.05 kg/m   Assessment and Plan: 1. Acute non-recurrent maxillary sinusitis Continue Neti Pot, flonase and mucinex - amoxicillin-clavulanate (AUGMENTIN) 875-125 MG tablet; Take 1 tablet by mouth 2 (two) times daily for 10 days.  Dispense: 20 tablet; Refill: 0  2. Acute conjunctivitis of right eye, unspecified acute conjunctivitis type - ciprofloxacin (CILOXAN) 0.3 % ophthalmic solution; Place 2 drops into the right eye every 4 (four) hours while awake. Administer 1 drop, every 2 hours, while awake, for 2 days. Then 1 drop, every 4 hours, while awake, for the next 5 days.  Dispense: 5 mL; Refill: 0   Partially dictated using Editor, commissioning. Any errors are unintentional.  Halina Maidens, MD [facility]  02/24/2022

## 2022-05-26 ENCOUNTER — Ambulatory Visit: Payer: Medicare HMO

## 2022-05-29 ENCOUNTER — Ambulatory Visit (INDEPENDENT_AMBULATORY_CARE_PROVIDER_SITE_OTHER): Payer: Medicare HMO

## 2022-05-29 VITALS — Ht 64.5 in | Wt 166.0 lb

## 2022-05-29 DIAGNOSIS — Z0000 Encounter for general adult medical examination without abnormal findings: Secondary | ICD-10-CM

## 2022-05-29 NOTE — Progress Notes (Signed)
I connected with  Natalie [name] on 05/29/22 by a audio enabled telemedicine application and verified that I am speaking with the correct person using two identifiers.  Patient Location: Home  Provider Location: Office/Clinic  I discussed the limitations of evaluation and management by telemedicine. The patient expressed understanding and agreed to proceed.  Subjective:   Natalie [name] is a 67 y.o. female who presents for Medicare Annual (Subsequent) preventive examination.  Review of Systems     Cardiac Risk Factors include: advanced age (>64mn, >>22women)     Objective:    There were no vitals filed for this visit. There is no height or weight on file to calculate BMI.     05/29/2022    3:11 PM 11/22/2021    6:36 AM 07/09/2021    9:01 AM 05/22/2021   10:56 AM 11/21/2020    5:40 PM 05/16/2020   11:13 AM 03/10/2018    2:06 PM  Advanced Directives  Does Patient Have a Medical Advance Directive? No Yes Yes Yes No Yes Yes  Type of ACorporate treasurerof ATraverLiving will HAllemanLiving will HKennardLiving will  HManyLiving will HMarinette Does patient want to make changes to medical advance directive?  No - Patient declined     No - Patient declined  Copy of HHalburin Chart?  No - copy requested  No - copy requested  No - copy requested No - copy requested  Would patient like information on creating a medical advance directive? No - Patient declined    No - Guardian declined      Current Medications (verified) Outpatient Encounter Medications as of 05/29/2022  Medication Sig   CALCIUM 600 1500 (600 Ca) MG TABS tablet    cholecalciferol (VITAMIN D) 1000 units tablet Take 1,000 Units by mouth daily.   cyanocobalamin 1000 MCG tablet Take 1,000 mcg by mouth daily.   diazepam (VALIUM) 2 MG tablet Take 1 tablet (2 mg total) by mouth every 6 (six) hours as  needed (vertigo).   fluticasone (FLONASE) 50 MCG/ACT nasal spray Place into both nostrils daily.   melatonin 3 MG TABS tablet Take 3 mg by mouth at bedtime.   Multiple Vitamin (MULTIVITAMIN) tablet Take 1 tablet by mouth daily.   polyethylene glycol (MIRALAX / GLYCOLAX) packet Take 17 g by mouth daily.   vitamin C (ASCORBIC ACID) 500 MG tablet Take 500 mg by mouth daily.   ciprofloxacin (CILOXAN) 0.3 % ophthalmic solution Place 2 drops into the right eye every 4 (four) hours while awake. Administer 1 drop, every 2 hours, while awake, for 2 days. Then 1 drop, every 4 hours, while awake, for the next 5 days. (Patient not taking: Reported on 05/29/2022)   MAGNESIUM PO Take by mouth. (Patient not taking: Reported on 05/29/2022)   No facility-administered encounter medications on file as of 05/29/2022.    Allergies (verified) Codeine, Doxycycline, Levofloxacin, Effexor [venlafaxine], Sulfa antibiotics, and Topamax [topiramate]   History: Past Medical History:  Diagnosis Date   Benign neoplasm of sigmoid colon    Chronic constipation    Chronic sinusitis    Cystitis    Diverticulitis    Perforated bowel (HCC)    Pleural effusion on right 2016   Preventative health care 05/24/2015   Wears dentures    partial upper   Past Surgical History:  Procedure Laterality Date   BREAST BIOPSY Right 03/15/2019  stereo biopsy/ x clip/ benign   COLONOSCOPY N/A 11/22/2021   Procedure: COLONOSCOPY;  Surgeon: Lucilla Lame, MD;  Location: [place];  Service: Endoscopy;  Laterality: N/A;   COLONOSCOPY WITH PROPOFOL N/A 07/27/2015   Procedure: COLONOSCOPY WITH PROPOFOL;  Surgeon: Lucilla Lame, MD;  Location: [place];  Service: Endoscopy;  Laterality: N/A;   COLONOSCOPY WITH PROPOFOL N/A 03/14/2016   Procedure: COLONOSCOPY WITH PROPOFOL;  Surgeon: Lucilla Lame, MD;  Location: [place];  Service: Endoscopy;  Laterality: N/A;  Cannot arrive before 0830 AM   COLOSTOMY REVERSAL N/A  09/26/2015   Procedure: COLOSTOMY REVERSAL / TAKE DOWN;  Surgeon: Hubbard Robinson, MD;  Location: [facility];  Service: General;  Laterality: N/A;   LAPAROTOMY N/A 03/01/2015   Procedure: EXPLORATORY LAPAROTOMY, SIGMOID COLECTOMY, COLOSTOMY;  Surgeon: Hubbard Robinson, MD;  Location: [facility];  Service: General;  Laterality: N/A;   POLYPECTOMY  03/14/2016   Procedure: POLYPECTOMY;  Surgeon: Lucilla Lame, MD;  Location: [place];  Service: Endoscopy;;   POLYPECTOMY  11/22/2021   Procedure: POLYPECTOMY;  Surgeon: Lucilla Lame, MD;  Location: [place];  Service: Endoscopy;;   Family History  Problem Relation Age of Onset   Stroke Mother    Arthritis Father    Lung cancer Father    Colon cancer Maternal Aunt    Breast cancer Paternal Aunt    Heart attack Brother    Social History   Socioeconomic History   Marital status: Married    Spouse name: Not on file   Number of children: 0   Years of education: Not on file   Highest education level: Not on file  Occupational History   Occupation: Retired    Comment: Peds RN  Tobacco Use   Smoking status: Never   Smokeless tobacco: Never  Vaping Use   Vaping Use: Never used  Substance and Sexual Activity   Alcohol use: No    Alcohol/week: 0.0 standard drinks of alcohol   Drug use: No   Sexual activity: Not on file  Other Topics Concern   Not on file  Social History Narrative   Married. No children of her own but has step children   Worked for [employer] in [place]. Retired.    Is a Equities trader.   Enjoys spending time with family, reading, walking. She is raising her granddaughter.   Social Determinants of Health   Financial Resource Strain: Low Risk  (05/29/2022)   Overall Financial Resource Strain (CARDIA)    Difficulty of Paying Living Expenses: Not very hard  Food Insecurity: No Food Insecurity (05/29/2022)   Hunger Vital Sign    Worried About Running Out of Food in the Last Year: Never  true    Ran Out of Food in the Last Year: Never true  Transportation Needs: No Transportation Needs (05/29/2022)   PRAPARE - Hydrologist (Medical): No    Lack of Transportation (Non-Medical): No  Physical Activity: Sufficiently Active (05/29/2022)   Exercise Vital Sign    Days of Exercise per Week: 3 days    Minutes of Exercise per Session: 60 min  Stress: No Stress Concern Present (05/29/2022)   [place]    Feeling of Stress : Only a little  Social Connections: Socially Integrated (05/29/2022)   Social Connection and Isolation Panel [NHANES]    Frequency of Communication with Friends and Family: More than three times a week  Frequency of Social Gatherings with Friends and Family: Once a week    Attends Religious Services: More than 4 times per year    Active Member of [employer] or Organizations: Yes    Attends Music therapist: More than 4 times per year    Marital Status: Married    Tobacco Counseling Counseling given: Not Answered   Clinical Intake:  Pre-visit preparation completed: Yes  Pain : No/denies pain     Nutritional Risks: None Diabetes: No  How often do you need to have someone help you when you read instructions, pamphlets, or other written materials from your doctor or pharmacy?: 1 - Never  Diabetic?no  Interpreter Needed?: No  Information entered by :: Kirke Shaggy, LPN   Activities of Daily Living    05/29/2022    3:12 PM 11/22/2021    6:35 AM  In your present state of health, do you have any difficulty performing the following activities:  Hearing? 0 0  Vision? 0 0  Difficulty concentrating or making decisions? 0 0  Walking or climbing stairs? 0 0  Dressing or bathing? 0 1  Doing errands, shopping? 0   Preparing Food and eating ? N   Using the Toilet? N   In the past six months, have you accidently leaked urine? N   Do you have problems  with loss of bowel control? N   Managing your Medications? N   Managing your Finances? N   Housekeeping or managing your Housekeeping? N     Patient Care Team: Glean Hess, MD as PCP - General (Internal Medicine) Lucilla Lame, MD as Consulting Physician (Gastroenterology) Margaretha Sheffield, MD (Otolaryngology)  Indicate any recent Medical Services you may have received from other than [facility] providers in the past year (date may be approximate).     Assessment:   This is a routine wellness examination for Alezay.  Hearing/Vision screen Hearing Screening - Comments:: No aids Vision Screening - Comments:: Wears glasses- Dr.King  Dietary issues and exercise activities discussed: Current Exercise Habits: Home exercise routine, Type of exercise: strength training/weights;treadmill, Time (Minutes): 60, Frequency (Times/Week): 4, Weekly Exercise (Minutes/Week): 240, Intensity: Mild   Goals Addressed             This Visit's Progress    DIET - EAT MORE FRUITS AND VEGETABLES         Depression Screen    05/29/2022    3:09 PM 07/15/2021    9:55 AM 05/22/2021   10:55 AM 02/06/2021   11:21 AM 12/11/2020    9:51 AM 11/05/2020    1:26 PM 07/31/2020   10:48 AM  PHQ 2/9 Scores  PHQ - 2 Score 0 0 0 0 0 0 0  PHQ- 9 Score 0 0  0 0 0 0    Fall Risk    05/29/2022    3:12 PM 07/15/2021    9:55 AM 05/22/2021   10:57 AM 12/11/2020    9:52 AM 11/05/2020    1:26 PM  Fall Risk   Falls in the past year? 0 0 0 0 0  Number falls in past yr: 0 0 0 0 0  Injury with Fall? 0 0 0 0 0  Risk for fall due to : No Fall Risks No Fall Risks No Fall Risks No Fall Risks No Fall Risks  Follow up Falls prevention discussed;Falls evaluation completed Falls evaluation completed Falls prevention discussed Falls evaluation completed Falls evaluation completed    FALL RISK PREVENTION PERTAINING TO  THE HOME:  Any stairs in or around the home? Yes  If so, are there any without handrails? No  Home free of loose throw  rugs in walkways, pet beds, electrical cords, etc? Yes  Adequate lighting in your home to reduce risk of falls? Yes   ASSISTIVE DEVICES UTILIZED TO PREVENT FALLS:  Life alert? No  Use of a cane, walker or w/c? No  Grab bars in the bathroom? Yes  Shower chair or bench in shower? Yes  Elevated toilet seat or a handicapped toilet? Yes   Cognitive Function:        05/29/2022    3:13 PM  6CIT Screen  What Year? 0 points  What month? 0 points  What time? 0 points  Count back from 20 0 points  Months in reverse 2 points  Repeat phrase 0 points  Total Score 2 points    Immunizations Immunization History  Administered Date(s) Administered   Influenza Inj Mdck Quad Pf 02/05/2018   Influenza,inj,Quad PF,6+ Mos 02/05/2018   Influenza-Unspecified 01/18/2015, 01/02/2016, 02/11/2017   PNEUMOCOCCAL CONJUGATE-20 07/15/2021   Tdap 06/27/2009    TDAP status: Due, Education has been provided regarding the importance of this vaccine. Advised may receive this vaccine at local pharmacy or Health Dept. Aware to provide a copy of the vaccination record if obtained from local pharmacy or Health Dept. Verbalized acceptance and understanding.  Flu Vaccine status: Declined, Education has been provided regarding the importance of this vaccine but patient still declined. Advised may receive this vaccine at local pharmacy or Health Dept. Aware to provide a copy of the vaccination record if obtained from local pharmacy or Health Dept. Verbalized acceptance and understanding.  Pneumococcal vaccine status: Up to date  Covid-19 vaccine status: Declined, Education has been provided regarding the importance of this vaccine but patient still declined. Advised may receive this vaccine at local pharmacy or Health Dept.or vaccine clinic. Aware to provide a copy of the vaccination record if obtained from local pharmacy or Health Dept. Verbalized acceptance and understanding.  Qualifies for Shingles Vaccine? Yes    Zostavax completed No   Shingrix Completed?: No.    Education has been provided regarding the importance of this vaccine. Patient has been advised to call insurance company to determine out of pocket expense if they have not yet received this vaccine. Advised may also receive vaccine at local pharmacy or Health Dept. Verbalized acceptance and understanding.  Screening Tests Health Maintenance  Topic Date Due   COVID-19 Vaccine (1) Never done   Zoster Vaccines- Shingrix (1 of 2) Never done   DTaP/Tdap/Td (2 - Td or Tdap) 06/28/2019   INFLUENZA VACCINE  07/06/2022 (Originally 11/05/2021)   MAMMOGRAM  02/19/2023   Medicare Annual Wellness (AWV)  05/30/2023   COLONOSCOPY (Pts 45-72yr Insurance coverage will need to be confirmed)  11/22/2028   Pneumonia Vaccine 69 Years old  Completed   DEXA SCAN  Completed   Hepatitis C Screening  Completed   HPV VACCINES  Aged Out    Health Maintenance  Health Maintenance Due  Topic Date Due   COVID-19 Vaccine (1) Never done   Zoster Vaccines- Shingrix (1 of 2) Never done   DTaP/Tdap/Td (2 - Td or Tdap) 06/28/2019    Colorectal cancer screening: Type of screening: Colonoscopy. Completed 11/22/21. Repeat every 7 years  Mammogram status: Completed 02/18/22. Repeat every year  Bone Density status: Completed 02/18/22. Results reflect: Bone density results: NORMAL. Repeat every 5 years.  Lung Cancer Screening: (Low Dose  CT Chest recommended if Age 69-80 years, 30 pack-year currently smoking OR have quit w/in 15years.) does not qualify.   Additional Screening:  Hepatitis C Screening: does qualify; Completed 05/31/18  Vision Screening: Recommended annual ophthalmology exams for early detection of glaucoma and other disorders of the eye. Is the patient up to date with their annual eye exam?  Yes  Who is the provider or what is the name of the office in which the patient attends annual eye exams? Dr.King If pt is not established with a provider, would  they like to be referred to a provider to establish care? No .   Dental Screening: Recommended annual dental exams for proper oral hygiene  Community Resource Referral / Chronic Care Management: CRR required this visit?  No   CCM required this visit?  No      Plan:     I have personally reviewed and noted the following in the patient's chart:   Medical and social history Use of alcohol, tobacco or illicit drugs  Current medications and supplements including opioid prescriptions. Patient is not currently taking opioid prescriptions. Functional ability and status Nutritional status Physical activity Advanced directives List of other physicians Hospitalizations, surgeries, and ER visits in previous 12 months Vitals Screenings to include cognitive, depression, and falls Referrals and appointments  In addition, I have reviewed and discussed with patient certain preventive protocols, quality metrics, and best practice recommendations. A written personalized care plan for preventive services as well as general preventive health recommendations were provided to patient.     Dionisio David, LPN   [id number]   Nurse Notes: none

## 2022-05-29 NOTE — Patient Instructions (Signed)
Natalie [name] , Thank you for taking time to come for your Medicare Wellness Visit. I appreciate your ongoing commitment to your health goals. Please review the following plan we discussed and let me know if I can assist you in the future.   These are the goals we discussed:  Goals      DIET - EAT MORE FRUITS AND VEGETABLES     Patient Stated     Patient states she would like to improve management of vertigo in order to get back to driving independently     Weight (lb) < 150 lb (68 kg)     Patient states she would like to lose 5-10 lbs over the next year with healthy eating and physical activity         This is a list of the screening recommended for you and due dates:  Health Maintenance  Topic Date Due   COVID-19 Vaccine (1) Never done   Zoster (Shingles) Vaccine (1 of 2) Never done   DTaP/Tdap/Td vaccine (2 - Td or Tdap) 06/28/2019   Flu Shot  07/06/2022*   Mammogram  02/19/2023   Medicare Annual Wellness Visit  05/30/2023   Colon Cancer Screening  11/22/2028   Pneumonia Vaccine  Completed   DEXA scan (bone density measurement)  Completed   Hepatitis C Screening: USPSTF Recommendation to screen - Ages 44-79 yo.  Completed   HPV Vaccine  Aged Out  *Topic was postponed. The date shown is not the original due date.    Advanced directives: no  Conditions/risks identified: none  Next appointment: Follow up in one year for your annual wellness visit 06/04/23 @ 1:30 pm by phone   Preventive Care 65 Years and Older, Female Preventive care refers to lifestyle choices and visits with your health care provider that can promote health and wellness. What does preventive care include? A yearly physical exam. This is also called an annual well check. Dental exams once or twice a year. Routine eye exams. Ask your health care provider how often you should have your eyes checked. Personal lifestyle choices, including: Daily care of your teeth and gums. Regular physical  activity. Eating a healthy diet. Avoiding tobacco and drug use. Limiting alcohol use. Practicing safe sex. Taking low-dose aspirin every day. Taking vitamin and mineral supplements as recommended by your health care provider. What happens during an annual well check? The services and screenings done by your health care provider during your annual well check will depend on your age, overall health, lifestyle risk factors, and family history of disease. Counseling  Your health care provider may ask you questions about your: Alcohol use. Tobacco use. Drug use. Emotional well-being. Home and relationship well-being. Sexual activity. Eating habits. History of falls. Memory and ability to understand (cognition). Work and work Statistician. Reproductive health. Screening  You may have the following tests or measurements: Height, weight, and BMI. Blood pressure. Lipid and cholesterol levels. These may be checked every 5 years, or more frequently if you are over 28 years old. Skin check. Lung cancer screening. You may have this screening every year starting at age 35 if you have a 30-pack-year history of smoking and currently smoke or have quit within the past 15 years. Fecal occult blood test (FOBT) of the stool. You may have this test every year starting at age 63. Flexible sigmoidoscopy or colonoscopy. You may have a sigmoidoscopy every 5 years or a colonoscopy every 10 years starting at age 71. Hepatitis C blood test. Hepatitis  B blood test. Sexually transmitted disease (STD) testing. Diabetes screening. This is done by checking your blood sugar (glucose) after you have not eaten for a while (fasting). You may have this done every 1-3 years. Bone density scan. This is done to screen for osteoporosis. You may have this done starting at age 56. Mammogram. This may be done every 1-2 years. Talk to your health care provider about how often you should have regular mammograms. Talk with your  health care provider about your test results, treatment options, and if necessary, the need for more tests. Vaccines  Your health care provider may recommend certain vaccines, such as: Influenza vaccine. This is recommended every year. Tetanus, diphtheria, and acellular pertussis (Tdap, Td) vaccine. You may need a Td booster every 10 years. Zoster vaccine. You may need this after age 3. Pneumococcal 13-valent conjugate (PCV13) vaccine. One dose is recommended after age 4. Pneumococcal polysaccharide (PPSV23) vaccine. One dose is recommended after age 10. Talk to your health care provider about which screenings and vaccines you need and how often you need them. This information is not intended to replace advice given to you by your health care provider. Make sure you discuss any questions you have with your health care provider. Document Released: 04/20/2015 Document Revised: 12/12/2015 Document Reviewed: 01/23/2015 Elsevier Interactive Patient Education  2017 [place] Prevention in the Home Falls can cause injuries. They can happen to people of all ages. There are many things you can do to make your home safe and to help prevent falls. What can I do on the outside of my home? Regularly fix the edges of walkways and driveways and fix any cracks. Remove anything that might make you trip as you walk through a door, such as a raised step or threshold. Trim any bushes or trees on the path to your home. Use bright outdoor lighting. Clear any walking paths of anything that might make someone trip, such as rocks or tools. Regularly check to see if handrails are loose or broken. Make sure that both sides of any steps have handrails. Any raised decks and porches should have guardrails on the edges. Have any leaves, snow, or ice cleared regularly. Use sand or salt on walking paths during winter. Clean up any spills in your garage right away. This includes oil or grease spills. What can I  do in the bathroom? Use night lights. Install grab bars by the toilet and in the tub and shower. Do not use towel bars as grab bars. Use non-skid mats or decals in the tub or shower. If you need to sit down in the shower, use a plastic, non-slip stool. Keep the floor dry. Clean up any water that spills on the floor as soon as it happens. Remove soap buildup in the tub or shower regularly. Attach bath mats securely with double-sided non-slip rug tape. Do not have throw rugs and other things on the floor that can make you trip. What can I do in the bedroom? Use night lights. Make sure that you have a light by your bed that is easy to reach. Do not use any sheets or blankets that are too big for your bed. They should not hang down onto the floor. Have a firm chair that has side arms. You can use this for support while you get dressed. Do not have throw rugs and other things on the floor that can make you trip. What can I do in the kitchen? Clean up any  spills right away. Avoid walking on wet floors. Keep items that you use a lot in easy-to-reach places. If you need to reach something above you, use a strong step stool that has a grab bar. Keep electrical cords out of the way. Do not use floor polish or wax that makes floors slippery. If you must use wax, use non-skid floor wax. Do not have throw rugs and other things on the floor that can make you trip. What can I do with my stairs? Do not leave any items on the stairs. Make sure that there are handrails on both sides of the stairs and use them. Fix handrails that are broken or loose. Make sure that handrails are as long as the stairways. Check any carpeting to make sure that it is firmly attached to the stairs. Fix any carpet that is loose or worn. Avoid having throw rugs at the top or bottom of the stairs. If you do have throw rugs, attach them to the floor with carpet tape. Make sure that you have a light switch at the top of the stairs  and the bottom of the stairs. If you do not have them, ask someone to add them for you. What else can I do to help prevent falls? Wear shoes that: Do not have high heels. Have rubber bottoms. Are comfortable and fit you well. Are closed at the toe. Do not wear sandals. If you use a stepladder: Make sure that it is fully opened. Do not climb a closed stepladder. Make sure that both sides of the stepladder are locked into place. Ask someone to hold it for you, if possible. Clearly mark and make sure that you can see: Any grab bars or handrails. First and last steps. Where the edge of each step is. Use tools that help you move around (mobility aids) if they are needed. These include: Canes. Walkers. Scooters. Crutches. Turn on the lights when you go into a dark area. Replace any light bulbs as soon as they burn out. Set up your furniture so you have a clear path. Avoid moving your furniture around. If any of your floors are uneven, fix them. If there are any pets around you, be aware of where they are. Review your medicines with your doctor. Some medicines can make you feel dizzy. This can increase your chance of falling. Ask your doctor what other things that you can do to help prevent falls. This information is not intended to replace advice given to you by your health care provider. Make sure you discuss any questions you have with your health care provider. Document Released: 01/18/2009 Document Revised: 08/30/2015 Document Reviewed: 04/28/2014 Elsevier Interactive Patient Education  2017 [employer].

## 2022-06-07 DIAGNOSIS — R69 Illness, unspecified: Secondary | ICD-10-CM | POA: Diagnosis not present

## 2022-07-18 ENCOUNTER — Encounter: Payer: Self-pay | Admitting: Internal Medicine

## 2022-07-18 ENCOUNTER — Ambulatory Visit (INDEPENDENT_AMBULATORY_CARE_PROVIDER_SITE_OTHER): Payer: Medicare HMO | Admitting: Internal Medicine

## 2022-07-18 VITALS — BP 106/76 | HR 67 | Ht 64.5 in | Wt 165.2 lb

## 2022-07-18 DIAGNOSIS — M8580 Other specified disorders of bone density and structure, unspecified site: Secondary | ICD-10-CM

## 2022-07-18 DIAGNOSIS — J0100 Acute maxillary sinusitis, unspecified: Secondary | ICD-10-CM

## 2022-07-18 DIAGNOSIS — Z1231 Encounter for screening mammogram for malignant neoplasm of breast: Secondary | ICD-10-CM | POA: Diagnosis not present

## 2022-07-18 DIAGNOSIS — Z1211 Encounter for screening for malignant neoplasm of colon: Secondary | ICD-10-CM

## 2022-07-18 DIAGNOSIS — G43809 Other migraine, not intractable, without status migrainosus: Secondary | ICD-10-CM | POA: Diagnosis not present

## 2022-07-18 DIAGNOSIS — E7800 Pure hypercholesterolemia, unspecified: Secondary | ICD-10-CM | POA: Diagnosis not present

## 2022-07-18 DIAGNOSIS — Z0000 Encounter for general adult medical examination without abnormal findings: Secondary | ICD-10-CM | POA: Diagnosis not present

## 2022-07-18 MED ORDER — TRIAMCINOLONE ACETONIDE 55 MCG/ACT NA AERO: 2.0000 | INHALATION_SPRAY | Freq: Every day | NASAL | 1 refills | Status: AC

## 2022-07-18 MED ORDER — AMOXICILLIN-POT CLAVULANATE 875-125 MG PO TABS: 1.0000 | ORAL_TABLET | Freq: Two times a day (BID) | ORAL | 0 refills | Status: AC

## 2022-07-18 NOTE — Assessment & Plan Note (Signed)
No recent change in the character or frequency of headaches. Headaches respond well to current therapy with Valium for associated vertigo. Will continue current plan and follow up if worsening.

## 2022-07-18 NOTE — Progress Notes (Signed)
Date:  07/18/2022   Name:  Natalie [name]   DOB:  [birth year]   MRN:  [id number]   Chief Complaint: Annual Exam (Breast exam no pap ) Natalie [name] is a 67 y.o. female who presents today for her Complete Annual Exam. She feels well. She reports exercising gym 3 days a week. She reports she is sleeping well. Breast complaints none.  Mammogram: 02/2022 DEXA: 02/2022 osteopenia Pap smear: discontinued Colonoscopy: 11/2021 repeat 7 yrs  Health Maintenance Due  Topic Date Due   COVID-19 Vaccine (1) Never done   Zoster Vaccines- Shingrix (1 of 2) Never done   DTaP/Tdap/Td (2 - Td or Tdap) 06/28/2019    Immunization History  Administered Date(s) Administered   Influenza Inj Mdck Quad Pf 02/05/2018   Influenza,inj,Quad PF,6+ Mos 02/05/2018   Influenza-Unspecified 01/18/2015, 01/02/2016, 02/11/2017   PNEUMOCOCCAL CONJUGATE-20 07/15/2021   Tdap 06/27/2009    Migraine  This is a recurrent problem. The problem occurs intermittently. The problem has been unchanged. Associated symptoms include coughing (with clear phlegm), sinus pressure and a visual change. Pertinent negatives include no abdominal pain, dizziness, fever, hearing loss, tinnitus or vomiting.  Sinus Problem This is a new problem. The current episode started 1 to 4 weeks ago. The problem is unchanged. There has been no fever. Associated symptoms include congestion, coughing (with clear phlegm) and sinus pressure. Pertinent negatives include no chills, headaches or shortness of breath. Past treatments include oral decongestants. The treatment provided mild relief.    Lab Results  Component Value Date   NA 142 07/15/2021   K 4.3 07/15/2021   CO2 24 07/15/2021   GLUCOSE 86 07/15/2021   BUN 16 07/15/2021   CREATININE 0.85 07/15/2021   CALCIUM 9.8 07/15/2021   EGFR 75 07/15/2021   GFRNONAA >60 11/21/2020   Lab Results  Component Value Date   CHOL 226 (H) 07/15/2021   HDL 68 07/15/2021   LDLCALC 141 (H) 07/15/2021    TRIG 97 07/15/2021   CHOLHDL 3.3 07/15/2021   Lab Results  Component Value Date   TSH 2.380 07/15/2021   Lab Results  Component Value Date   HGBA1C 5.5 11/27/2017   Lab Results  Component Value Date   WBC 5.1 07/15/2021   HGB 14.1 07/15/2021   HCT 42.0 07/15/2021   MCV 96 07/15/2021   PLT 279 07/15/2021   Lab Results  Component Value Date   ALT 14 07/15/2021   AST 20 07/15/2021   ALKPHOS 92 07/15/2021   BILITOT 0.6 07/15/2021   Lab Results  Component Value Date   VD25OH 45.9 07/15/2021     Review of Systems  Constitutional:  Negative for chills, fatigue and fever.  HENT:  Positive for congestion and sinus pressure. Negative for hearing loss, tinnitus, trouble swallowing and voice change.   Eyes:  Negative for visual disturbance.  Respiratory:  Positive for cough (with clear phlegm). Negative for chest tightness, shortness of breath and wheezing.   Cardiovascular:  Negative for chest pain, palpitations and leg swelling.  Gastrointestinal:  Negative for abdominal pain, constipation, diarrhea and vomiting.  Endocrine: Negative for polydipsia and polyuria.  Genitourinary:  Negative for dysuria, frequency, genital sores, vaginal bleeding and vaginal discharge.  Musculoskeletal:  Negative for arthralgias, gait problem and joint swelling.  Skin:  Negative for color change and rash.  Neurological:  Negative for dizziness, tremors, light-headedness and headaches.  Hematological:  Negative for adenopathy. Does not bruise/bleed easily.  Psychiatric/Behavioral:  Negative for dysphoric mood and  sleep disturbance. The patient is not nervous/anxious.     Patient Active Problem List   Diagnosis Date Noted   History of colonic polyps    Polyp of ascending colon    Dizziness, nonspecific 07/31/2020   Periodic limb movement sleep disorder 03/14/2020   Osteopenia determined by x-ray 09/22/2019   Elevated LDL cholesterol level 07/18/2019   Vestibular migraine 11/27/2017   Hx of  diverticulitis of colon    Benign neoplasm of ascending colon    History of colostomy reversal     Allergies  Allergen Reactions   Codeine Nausea Only   Doxycycline Nausea And Vomiting   Levofloxacin Other (See Comments)    insomnia   Effexor [Venlafaxine] Nausea Only    And dizziness   Sulfa Antibiotics Rash   Topamax [Topiramate] Other (See Comments)    sedation    Past Surgical History:  Procedure Laterality Date   BREAST BIOPSY Right 03/15/2019   stereo biopsy/ x clip/ benign   COLONOSCOPY N/A 11/22/2021   Procedure: COLONOSCOPY;  Surgeon: Midge Minium, MD;  Location: [facility];  Service: Endoscopy;  Laterality: N/A;   COLONOSCOPY WITH PROPOFOL N/A 07/27/2015   Procedure: COLONOSCOPY WITH PROPOFOL;  Surgeon: Midge Minium, MD;  Location: [facility];  Service: Endoscopy;  Laterality: N/A;   COLONOSCOPY WITH PROPOFOL N/A 03/14/2016   Procedure: COLONOSCOPY WITH PROPOFOL;  Surgeon: Midge Minium, MD;  Location: [facility];  Service: Endoscopy;  Laterality: N/A;  Cannot arrive before 0830 AM   COLOSTOMY REVERSAL N/A 09/26/2015   Procedure: COLOSTOMY REVERSAL / TAKE DOWN;  Surgeon: Gladis Riffle, MD;  Location: [facility] OR[facility];  Service: General;  Laterality: N/A;   LAPAROTOMY N/A 03/01/2015   Procedure: EXPLORATORY LAPAROTOMY, SIGMOID COLECTOMY, COLOSTOMY;  Surgeon: Gladis Riffle, MD;  Location: [facility];  Service: General;  Laterality: N/A;   POLYPECTOMY  03/14/2016   Procedure: POLYPECTOMY;  Surgeon: Midge Minium, MD;  Location: [facility];  Service: Endoscopy;;   POLYPECTOMY  11/22/2021   Procedure: POLYPECTOMY;  Surgeon: Midge Minium, MD;  Location: [facility];  Service: Endoscopy;;    Social History   Tobacco Use   Smoking status: Never   Smokeless tobacco: Never  Vaping Use   Vaping Use: Never used  Substance Use Topics   Alcohol use: No    Alcohol/week: 0.0 standard drinks of alcohol   Drug use: No     Medication list  has been reviewed and updated.  Current Meds  Medication Sig   amoxicillin-clavulanate (AUGMENTIN) 875-125 MG tablet Take 1 tablet by mouth 2 (two) times daily for 10 days.   CALCIUM 600 1500 (600 Ca) MG TABS tablet    cholecalciferol (VITAMIN D) 1000 units tablet Take 1,000 Units by mouth daily.   cyanocobalamin 1000 MCG tablet Take 1,000 mcg by mouth daily.   diazepam (VALIUM) 2 MG tablet Take 1 tablet (2 mg total) by mouth every 6 (six) hours as needed (vertigo).   melatonin 3 MG TABS tablet Take 3 mg by mouth at bedtime.   Multiple Vitamin (MULTIVITAMIN) tablet Take 1 tablet by mouth daily.   polyethylene glycol (MIRALAX / GLYCOLAX) packet Take 17 g by mouth daily.   triamcinolone (NASACORT ALLERGY 24HR) 55 MCG/ACT AERO nasal inhaler Place 2 sprays into the nose daily.   vitamin C (ASCORBIC ACID) 500 MG tablet Take 500 mg by mouth daily.   [DISCONTINUED] fluticasone (FLONASE) 50 MCG/ACT nasal spray Place into both nostrils daily.  07/18/2022    9:29 AM 07/15/2021    9:55 AM 02/06/2021   11:21 AM 12/11/2020    9:51 AM  GAD 7 : Generalized Anxiety Score  Nervous, Anxious, on Edge 0 0 0 1  Control/stop worrying 0 0 0 0  Worry too much - different things 0 0 0 0  Trouble relaxing 0 0 0 1  Restless 0 0 0 0  Easily annoyed or irritable 0 0 0 0  Afraid - awful might happen 0 0 0 0  Total GAD 7 Score 0 0 0 2  Anxiety Difficulty Not difficult at all Not difficult at all Not difficult at all        07/18/2022    9:28 AM 05/29/2022    3:09 PM 07/15/2021    9:55 AM  Depression screen PHQ 2/9  Decreased Interest 0 0 0  Down, Depressed, Hopeless 0 0 0  PHQ - 2 Score 0 0 0  Altered sleeping 0 0 0  Tired, decreased energy 0 0 0  Change in appetite 0 0 0  Feeling bad or failure about yourself  0 0 0  Trouble concentrating 0 0 0  Moving slowly or fidgety/restless 0 0 0  Suicidal thoughts 0 0 0  PHQ-9 Score 0 0 0  Difficult doing work/chores Not difficult at all Not difficult at all  Not difficult at all    BP Readings from Last 3 Encounters:  07/18/22 106/76  02/24/22 124/79  11/22/21 103/60    Physical Exam Vitals and nursing note reviewed.  Constitutional:      General: She is not in acute distress.    Appearance: She is well-developed.  HENT:     Head: Normocephalic and atraumatic.     Right Ear: Tympanic membrane and ear canal normal.     Left Ear: Tympanic membrane and ear canal normal.     Nose:     Right Sinus: Maxillary sinus tenderness present. No frontal sinus tenderness.     Left Sinus: Maxillary sinus tenderness present. No frontal sinus tenderness.     Mouth/Throat:     Pharynx: Uvula midline. No oropharyngeal exudate or posterior oropharyngeal erythema.  Eyes:     General: No scleral icterus.       Right eye: No discharge.        Left eye: No discharge.     Conjunctiva/sclera: Conjunctivae normal.  Neck:     Thyroid: No thyromegaly.     Vascular: No carotid bruit.  Cardiovascular:     Rate and Rhythm: Normal rate and regular rhythm.     Pulses: Normal pulses.     Heart sounds: Normal heart sounds.  Pulmonary:     Effort: Pulmonary effort is normal. No respiratory distress.     Breath sounds: Transmitted upper airway sounds present. No wheezing.  Chest:  Breasts:    Right: No mass, nipple discharge, skin change or tenderness.     Left: No mass, nipple discharge, skin change or tenderness.  Abdominal:     General: Bowel sounds are normal.     Palpations: Abdomen is soft.     Tenderness: There is no abdominal tenderness.  Musculoskeletal:     Cervical back: Normal range of motion. No erythema.     Right lower leg: No edema.     Left lower leg: No edema.  Lymphadenopathy:     Cervical: No cervical adenopathy.  Skin:    General: Skin is warm and dry.  Findings: No rash.  Neurological:     Mental Status: She is alert and oriented to person, place, and time.     Cranial Nerves: No cranial nerve deficit.     Sensory: No sensory  deficit.     Deep Tendon Reflexes: Reflexes are normal and symmetric.  Psychiatric:        Attention and Perception: Attention normal.        Mood and Affect: Mood normal.     Wt Readings from Last 3 Encounters:  07/18/22 165 lb 3.2 oz (74.9 kg)  05/29/22 166 lb (75.3 kg)  02/24/22 166 lb (75.3 kg)    BP 106/76   Pulse 67   Ht 5' 4.5" (1.638 m)   Wt 165 lb 3.2 oz (74.9 kg)   SpO2 98%   BMI 27.92 kg/m   Assessment and Plan:  Problem List Items Addressed This Visit       Cardiovascular and Mediastinum   Vestibular migraine    No recent change in the character or frequency of headaches. Headaches respond well to current therapy with Valium for associated vertigo. Will continue current plan and follow up if worsening.       Relevant Orders   CBC with Differential/Platelet   TSH     Musculoskeletal and Integument   Osteopenia determined by x-ray    DEXA 02/2022 Recommend calcium 1200 mg per day and Vitamin D 1000 IU daily plus weight bearing exercise.  Will repeat DEXA in 2-3 years.       Relevant Orders   Comprehensive metabolic panel   VITAMIN D 25 Hydroxy (Vit-D Deficiency, Fractures)     Other   Elevated LDL cholesterol level    Lipids controlled with diet alone 10 yr risk below average so no medications recommended      Relevant Orders   Lipid panel   Other Visit Diagnoses     Annual physical exam    -  Primary   Relevant Orders   Urinalysis, Routine w reflex microscopic   Encounter for screening mammogram for breast cancer       Relevant Orders   MM 3D SCREENING MAMMOGRAM BILATERAL BREAST   Colon cancer screening       Acute non-recurrent maxillary sinusitis       Relevant Medications   triamcinolone (NASACORT ALLERGY 24HR) 55 MCG/ACT AERO nasal inhaler   amoxicillin-clavulanate (AUGMENTIN) 875-125 MG tablet       No follow-ups on file.   Partially dictated using Dragon software, any errors are not intentional.  Reubin Milan,  MD [facility] [place], [place]

## 2022-07-18 NOTE — Assessment & Plan Note (Signed)
DEXA 02/2022 Recommend calcium 1200 mg per day and Vitamin D 1000 IU daily plus weight bearing exercise.  Will repeat DEXA in 2-3 years.

## 2022-07-18 NOTE — Patient Instructions (Signed)
Call [facility] to schedule your mammogram at [phone].  

## 2022-07-18 NOTE — Assessment & Plan Note (Signed)
Lipids controlled with diet alone 10 yr risk below average so no medications recommended

## 2022-07-19 LAB — COMPREHENSIVE METABOLIC PANEL
ALT: 14 IU/L (ref 0–32)
AST: 22 IU/L (ref 0–40)
Albumin/Globulin Ratio: 1.9 (ref 1.2–2.2)
Albumin: 4.7 g/dL (ref 3.9–4.9)
Alkaline Phosphatase: 103 IU/L (ref 44–121)
BUN/Creatinine Ratio: 18 (ref 12–28)
BUN: 15 mg/dL (ref 8–27)
Bilirubin Total: 0.5 mg/dL (ref 0.0–1.2)
CO2: 25 mmol/L (ref 20–29)
Calcium: 10.1 mg/dL (ref 8.7–10.3)
Chloride: 104 mmol/L (ref 96–106)
Creatinine, Ser: 0.84 mg/dL (ref 0.57–1.00)
Globulin, Total: 2.5 g/dL (ref 1.5–4.5)
Glucose: 87 mg/dL (ref 70–99)
Potassium: 4.7 mmol/L (ref 3.5–5.2)
Sodium: 143 mmol/L (ref 134–144)
Total Protein: 7.2 g/dL (ref 6.0–8.5)
eGFR: 76 mL/min/{1.73_m2} (ref >59)

## 2022-07-19 LAB — URINALYSIS, ROUTINE W REFLEX MICROSCOPIC
Bilirubin, UA: NEGATIVE
Glucose, UA: NEGATIVE
Ketones, UA: NEGATIVE
Leukocytes,UA: NEGATIVE
Nitrite, UA: NEGATIVE
Protein,UA: NEGATIVE
RBC, UA: NEGATIVE
Specific Gravity, UA: 1.019 (ref 1.005–1.030)
Urobilinogen, Ur: 0.2 mg/dL (ref 0.2–1.0)
pH, UA: 7.5 (ref 5.0–7.5)

## 2022-07-19 LAB — CBC WITH DIFFERENTIAL/PLATELET
Basophils Absolute: 0.1 10*3/uL (ref 0.0–0.2)
Basos: 1 %
EOS (ABSOLUTE): 0.4 10*3/uL (ref 0.0–0.4)
Eos: 7 %
Hematocrit: 43.8 % (ref 34.0–46.6)
Hemoglobin: 14.7 g/dL (ref 11.1–15.9)
Immature Grans (Abs): 0.1 10*3/uL (ref 0.0–0.1)
Immature Granulocytes: 1 %
Lymphocytes Absolute: 2.2 10*3/uL (ref 0.7–3.1)
Lymphs: 34 %
MCH: 32.3 pg (ref 26.6–33.0)
MCHC: 33.6 g/dL (ref 31.5–35.7)
MCV: 96 fL (ref 79–97)
Monocytes Absolute: 0.6 10*3/uL (ref 0.1–0.9)
Monocytes: 9 %
Neutrophils Absolute: 3.2 10*3/uL (ref 1.4–7.0)
Neutrophils: 48 %
Platelets: 267 10*3/uL (ref 150–450)
RBC: 4.55 x10E6/uL (ref 3.77–5.28)
RDW: 12.2 % (ref 11.7–15.4)
WBC: 6.6 10*3/uL (ref 3.4–10.8)

## 2022-07-19 LAB — LIPID PANEL
Chol/HDL Ratio: 3.5 ratio (ref 0.0–4.4)
Cholesterol, Total: 222 mg/dL — ABNORMAL HIGH (ref 100–199)
HDL: 63 mg/dL (ref >39)
LDL Chol Calc (NIH): 134 mg/dL — ABNORMAL HIGH (ref 0–99)
Triglycerides: 140 mg/dL (ref 0–149)
VLDL Cholesterol Cal: 25 mg/dL (ref 5–40)

## 2022-07-19 LAB — VITAMIN D 25 HYDROXY (VIT D DEFICIENCY, FRACTURES): Vit D, 25-Hydroxy: 48.5 ng/mL (ref 30.0–100.0)

## 2022-07-19 LAB — TSH: TSH: 2.37 u[IU]/mL (ref 0.450–4.500)

## 2022-07-25 DIAGNOSIS — R69 Illness, unspecified: Secondary | ICD-10-CM | POA: Diagnosis not present

## 2022-08-08 DIAGNOSIS — Z0101 Encounter for examination of eyes and vision with abnormal findings: Secondary | ICD-10-CM | POA: Diagnosis not present

## 2022-11-19 DIAGNOSIS — R42 Dizziness and giddiness: Secondary | ICD-10-CM | POA: Diagnosis not present

## 2022-11-19 DIAGNOSIS — M542 Cervicalgia: Secondary | ICD-10-CM | POA: Diagnosis not present

## 2022-11-19 DIAGNOSIS — M9901 Segmental and somatic dysfunction of cervical region: Secondary | ICD-10-CM | POA: Diagnosis not present

## 2022-11-19 DIAGNOSIS — M9902 Segmental and somatic dysfunction of thoracic region: Secondary | ICD-10-CM | POA: Diagnosis not present

## 2022-11-20 DIAGNOSIS — M9901 Segmental and somatic dysfunction of cervical region: Secondary | ICD-10-CM | POA: Diagnosis not present

## 2022-11-20 DIAGNOSIS — M9902 Segmental and somatic dysfunction of thoracic region: Secondary | ICD-10-CM | POA: Diagnosis not present

## 2022-11-20 DIAGNOSIS — R42 Dizziness and giddiness: Secondary | ICD-10-CM | POA: Diagnosis not present

## 2022-11-20 DIAGNOSIS — M542 Cervicalgia: Secondary | ICD-10-CM | POA: Diagnosis not present

## 2022-11-21 DIAGNOSIS — M9901 Segmental and somatic dysfunction of cervical region: Secondary | ICD-10-CM | POA: Diagnosis not present

## 2022-11-21 DIAGNOSIS — M9902 Segmental and somatic dysfunction of thoracic region: Secondary | ICD-10-CM | POA: Diagnosis not present

## 2022-11-21 DIAGNOSIS — R42 Dizziness and giddiness: Secondary | ICD-10-CM | POA: Diagnosis not present

## 2022-11-21 DIAGNOSIS — M542 Cervicalgia: Secondary | ICD-10-CM | POA: Diagnosis not present

## 2022-11-24 DIAGNOSIS — R42 Dizziness and giddiness: Secondary | ICD-10-CM | POA: Diagnosis not present

## 2022-11-24 DIAGNOSIS — M9901 Segmental and somatic dysfunction of cervical region: Secondary | ICD-10-CM | POA: Diagnosis not present

## 2022-11-24 DIAGNOSIS — M9902 Segmental and somatic dysfunction of thoracic region: Secondary | ICD-10-CM | POA: Diagnosis not present

## 2022-11-24 DIAGNOSIS — M542 Cervicalgia: Secondary | ICD-10-CM | POA: Diagnosis not present

## 2022-11-26 DIAGNOSIS — M9902 Segmental and somatic dysfunction of thoracic region: Secondary | ICD-10-CM | POA: Diagnosis not present

## 2022-11-26 DIAGNOSIS — R42 Dizziness and giddiness: Secondary | ICD-10-CM | POA: Diagnosis not present

## 2022-11-26 DIAGNOSIS — M9901 Segmental and somatic dysfunction of cervical region: Secondary | ICD-10-CM | POA: Diagnosis not present

## 2022-11-26 DIAGNOSIS — M542 Cervicalgia: Secondary | ICD-10-CM | POA: Diagnosis not present

## 2022-11-28 DIAGNOSIS — M9902 Segmental and somatic dysfunction of thoracic region: Secondary | ICD-10-CM | POA: Diagnosis not present

## 2022-11-28 DIAGNOSIS — M9901 Segmental and somatic dysfunction of cervical region: Secondary | ICD-10-CM | POA: Diagnosis not present

## 2022-11-28 DIAGNOSIS — M542 Cervicalgia: Secondary | ICD-10-CM | POA: Diagnosis not present

## 2022-11-28 DIAGNOSIS — R42 Dizziness and giddiness: Secondary | ICD-10-CM | POA: Diagnosis not present

## 2022-12-05 DIAGNOSIS — M9902 Segmental and somatic dysfunction of thoracic region: Secondary | ICD-10-CM | POA: Diagnosis not present

## 2022-12-05 DIAGNOSIS — M9901 Segmental and somatic dysfunction of cervical region: Secondary | ICD-10-CM | POA: Diagnosis not present

## 2022-12-05 DIAGNOSIS — R42 Dizziness and giddiness: Secondary | ICD-10-CM | POA: Diagnosis not present

## 2022-12-05 DIAGNOSIS — M542 Cervicalgia: Secondary | ICD-10-CM | POA: Diagnosis not present

## 2022-12-09 DIAGNOSIS — M9902 Segmental and somatic dysfunction of thoracic region: Secondary | ICD-10-CM | POA: Diagnosis not present

## 2022-12-09 DIAGNOSIS — M542 Cervicalgia: Secondary | ICD-10-CM | POA: Diagnosis not present

## 2022-12-09 DIAGNOSIS — M9901 Segmental and somatic dysfunction of cervical region: Secondary | ICD-10-CM | POA: Diagnosis not present

## 2022-12-09 DIAGNOSIS — R42 Dizziness and giddiness: Secondary | ICD-10-CM | POA: Diagnosis not present

## 2022-12-10 DIAGNOSIS — M542 Cervicalgia: Secondary | ICD-10-CM | POA: Diagnosis not present

## 2022-12-10 DIAGNOSIS — M9902 Segmental and somatic dysfunction of thoracic region: Secondary | ICD-10-CM | POA: Diagnosis not present

## 2022-12-10 DIAGNOSIS — M9901 Segmental and somatic dysfunction of cervical region: Secondary | ICD-10-CM | POA: Diagnosis not present

## 2022-12-10 DIAGNOSIS — R42 Dizziness and giddiness: Secondary | ICD-10-CM | POA: Diagnosis not present

## 2022-12-12 DIAGNOSIS — M9902 Segmental and somatic dysfunction of thoracic region: Secondary | ICD-10-CM | POA: Diagnosis not present

## 2022-12-12 DIAGNOSIS — M542 Cervicalgia: Secondary | ICD-10-CM | POA: Diagnosis not present

## 2022-12-12 DIAGNOSIS — R42 Dizziness and giddiness: Secondary | ICD-10-CM | POA: Diagnosis not present

## 2022-12-12 DIAGNOSIS — M9901 Segmental and somatic dysfunction of cervical region: Secondary | ICD-10-CM | POA: Diagnosis not present

## 2022-12-16 DIAGNOSIS — M542 Cervicalgia: Secondary | ICD-10-CM | POA: Diagnosis not present

## 2022-12-16 DIAGNOSIS — R42 Dizziness and giddiness: Secondary | ICD-10-CM | POA: Diagnosis not present

## 2022-12-16 DIAGNOSIS — M9902 Segmental and somatic dysfunction of thoracic region: Secondary | ICD-10-CM | POA: Diagnosis not present

## 2022-12-16 DIAGNOSIS — M9901 Segmental and somatic dysfunction of cervical region: Secondary | ICD-10-CM | POA: Diagnosis not present

## 2022-12-19 DIAGNOSIS — M9902 Segmental and somatic dysfunction of thoracic region: Secondary | ICD-10-CM | POA: Diagnosis not present

## 2022-12-19 DIAGNOSIS — M9901 Segmental and somatic dysfunction of cervical region: Secondary | ICD-10-CM | POA: Diagnosis not present

## 2022-12-19 DIAGNOSIS — R42 Dizziness and giddiness: Secondary | ICD-10-CM | POA: Diagnosis not present

## 2022-12-19 DIAGNOSIS — M542 Cervicalgia: Secondary | ICD-10-CM | POA: Diagnosis not present

## 2022-12-23 DIAGNOSIS — M9902 Segmental and somatic dysfunction of thoracic region: Secondary | ICD-10-CM | POA: Diagnosis not present

## 2022-12-23 DIAGNOSIS — R42 Dizziness and giddiness: Secondary | ICD-10-CM | POA: Diagnosis not present

## 2022-12-23 DIAGNOSIS — M542 Cervicalgia: Secondary | ICD-10-CM | POA: Diagnosis not present

## 2022-12-23 DIAGNOSIS — M9901 Segmental and somatic dysfunction of cervical region: Secondary | ICD-10-CM | POA: Diagnosis not present

## 2023-01-06 DIAGNOSIS — M9901 Segmental and somatic dysfunction of cervical region: Secondary | ICD-10-CM | POA: Diagnosis not present

## 2023-01-06 DIAGNOSIS — R42 Dizziness and giddiness: Secondary | ICD-10-CM | POA: Diagnosis not present

## 2023-01-06 DIAGNOSIS — M542 Cervicalgia: Secondary | ICD-10-CM | POA: Diagnosis not present

## 2023-01-06 DIAGNOSIS — M9902 Segmental and somatic dysfunction of thoracic region: Secondary | ICD-10-CM | POA: Diagnosis not present

## 2023-01-13 DIAGNOSIS — M9901 Segmental and somatic dysfunction of cervical region: Secondary | ICD-10-CM | POA: Diagnosis not present

## 2023-01-13 DIAGNOSIS — R42 Dizziness and giddiness: Secondary | ICD-10-CM | POA: Diagnosis not present

## 2023-01-13 DIAGNOSIS — M9902 Segmental and somatic dysfunction of thoracic region: Secondary | ICD-10-CM | POA: Diagnosis not present

## 2023-01-13 DIAGNOSIS — M542 Cervicalgia: Secondary | ICD-10-CM | POA: Diagnosis not present

## 2023-01-16 DIAGNOSIS — M9901 Segmental and somatic dysfunction of cervical region: Secondary | ICD-10-CM | POA: Diagnosis not present

## 2023-01-16 DIAGNOSIS — M9902 Segmental and somatic dysfunction of thoracic region: Secondary | ICD-10-CM | POA: Diagnosis not present

## 2023-01-16 DIAGNOSIS — M542 Cervicalgia: Secondary | ICD-10-CM | POA: Diagnosis not present

## 2023-01-16 DIAGNOSIS — R42 Dizziness and giddiness: Secondary | ICD-10-CM | POA: Diagnosis not present

## 2023-01-21 DIAGNOSIS — M542 Cervicalgia: Secondary | ICD-10-CM | POA: Diagnosis not present

## 2023-01-21 DIAGNOSIS — M9901 Segmental and somatic dysfunction of cervical region: Secondary | ICD-10-CM | POA: Diagnosis not present

## 2023-01-21 DIAGNOSIS — R42 Dizziness and giddiness: Secondary | ICD-10-CM | POA: Diagnosis not present

## 2023-01-21 DIAGNOSIS — M9902 Segmental and somatic dysfunction of thoracic region: Secondary | ICD-10-CM | POA: Diagnosis not present

## 2023-01-23 ENCOUNTER — Ambulatory Visit: Payer: Medicare HMO | Admitting: Podiatry

## 2023-01-28 DIAGNOSIS — M542 Cervicalgia: Secondary | ICD-10-CM | POA: Diagnosis not present

## 2023-01-28 DIAGNOSIS — M9902 Segmental and somatic dysfunction of thoracic region: Secondary | ICD-10-CM | POA: Diagnosis not present

## 2023-01-28 DIAGNOSIS — R42 Dizziness and giddiness: Secondary | ICD-10-CM | POA: Diagnosis not present

## 2023-01-28 DIAGNOSIS — M9901 Segmental and somatic dysfunction of cervical region: Secondary | ICD-10-CM | POA: Diagnosis not present

## 2023-02-03 ENCOUNTER — Ambulatory Visit (INDEPENDENT_AMBULATORY_CARE_PROVIDER_SITE_OTHER): Payer: Medicare HMO | Admitting: Internal Medicine

## 2023-02-03 ENCOUNTER — Encounter: Payer: Self-pay | Admitting: Internal Medicine

## 2023-02-03 VITALS — BP 122/78 | HR 79 | Temp 97.7°F | Ht 64.5 in | Wt 163.0 lb

## 2023-02-03 DIAGNOSIS — J0100 Acute maxillary sinusitis, unspecified: Secondary | ICD-10-CM

## 2023-02-03 MED ORDER — AMOXICILLIN-POT CLAVULANATE 875-125 MG PO TABS: 1.0000 | ORAL_TABLET | Freq: Two times a day (BID) | ORAL | 0 refills | Status: AC

## 2023-02-03 NOTE — Progress Notes (Signed)
Date:  02/03/2023   Name:  Natalie [name]   DOB:  [birth year]   MRN:  [id number]   Chief Complaint: Cough (X 2 weeks. Patient has sinus pressure, and pain. Congestion in face and throat. Cough- post nasal drainage. No fever. Sore throat.)  Cough This is a new problem. The current episode started 1 to 4 weeks ago. The problem has been unchanged. The problem occurs every few minutes. The cough is Non-productive. Associated symptoms include nasal congestion, postnasal drip and a sore throat. Pertinent negatives include no chest pain, chills, fever, shortness of breath or wheezing.    Review of Systems  Constitutional:  Negative for chills and fever.  HENT:  Positive for congestion, postnasal drip, sinus pressure and sore throat.   Respiratory:  Positive for cough. Negative for chest tightness, shortness of breath and wheezing.   Cardiovascular:  Negative for chest pain and palpitations.     Lab Results  Component Value Date   NA 143 07/18/2022   K 4.7 07/18/2022   CO2 25 07/18/2022   GLUCOSE 87 07/18/2022   BUN 15 07/18/2022   CREATININE 0.84 07/18/2022   CALCIUM 10.1 07/18/2022   EGFR 76 07/18/2022   GFRNONAA >60 11/21/2020   Lab Results  Component Value Date   CHOL 222 (H) 07/18/2022   HDL 63 07/18/2022   LDLCALC 134 (H) 07/18/2022   TRIG 140 07/18/2022   CHOLHDL 3.5 07/18/2022   Lab Results  Component Value Date   TSH 2.370 07/18/2022   Lab Results  Component Value Date   HGBA1C 5.5 11/27/2017   Lab Results  Component Value Date   WBC 6.6 07/18/2022   HGB 14.7 07/18/2022   HCT 43.8 07/18/2022   MCV 96 07/18/2022   PLT 267 07/18/2022   Lab Results  Component Value Date   ALT 14 07/18/2022   AST 22 07/18/2022   ALKPHOS 103 07/18/2022   BILITOT 0.5 07/18/2022   Lab Results  Component Value Date   VD25OH 48.5 07/18/2022     Patient Active Problem List   Diagnosis Date Noted   History of colonic polyps    Polyp of ascending colon    Dizziness,  nonspecific 07/31/2020   Periodic limb movement sleep disorder 03/14/2020   Osteopenia determined by x-ray 09/22/2019   Elevated LDL cholesterol level 07/18/2019   Vestibular migraine 11/27/2017   Hx of diverticulitis of colon    Benign neoplasm of ascending colon    History of colostomy reversal     Allergies  Allergen Reactions   Codeine Nausea Only   Doxycycline Nausea And Vomiting   Levofloxacin Other (See Comments)    insomnia   Effexor [Venlafaxine] Nausea Only    And dizziness   Sulfa Antibiotics Rash   Topamax [Topiramate] Other (See Comments)    sedation    Past Surgical History:  Procedure Laterality Date   BREAST BIOPSY Right 03/15/2019   stereo biopsy/ x clip/ benign   COLONOSCOPY N/A 11/22/2021   Procedure: COLONOSCOPY;  Surgeon: Midge Minium, MD;  Location: [facility];  Service: Endoscopy;  Laterality: N/A;   COLONOSCOPY WITH PROPOFOL N/A 07/27/2015   Procedure: COLONOSCOPY WITH PROPOFOL;  Surgeon: Midge Minium, MD;  Location: [facility];  Service: Endoscopy;  Laterality: N/A;   COLONOSCOPY WITH PROPOFOL N/A 03/14/2016   Procedure: COLONOSCOPY WITH PROPOFOL;  Surgeon: Midge Minium, MD;  Location: [facility];  Service: Endoscopy;  Laterality: N/A;  Cannot arrive before 0830 AM   COLOSTOMY  REVERSAL N/A 09/26/2015   Procedure: COLOSTOMY REVERSAL / TAKE DOWN;  Surgeon: Gladis Riffle, MD;  Location: [facility];  Service: General;  Laterality: N/A;   LAPAROTOMY N/A 03/01/2015   Procedure: EXPLORATORY LAPAROTOMY, SIGMOID COLECTOMY, COLOSTOMY;  Surgeon: Gladis Riffle, MD;  Location: [facility];  Service: General;  Laterality: N/A;   POLYPECTOMY  03/14/2016   Procedure: POLYPECTOMY;  Surgeon: Midge Minium, MD;  Location: [facility];  Service: Endoscopy;;   POLYPECTOMY  11/22/2021   Procedure: POLYPECTOMY;  Surgeon: Midge Minium, MD;  Location: [facility];  Service: Endoscopy;;    Social History   Tobacco Use   Smoking  status: Never   Smokeless tobacco: Never  Vaping Use   Vaping status: Never Used  Substance Use Topics   Alcohol use: No    Alcohol/week: 0.0 standard drinks of alcohol   Drug use: No     Medication list has been reviewed and updated.  Current Meds  Medication Sig   amoxicillin-clavulanate (AUGMENTIN) 875-125 MG tablet Take 1 tablet by mouth 2 (two) times daily for 10 days.   CALCIUM 600 1500 (600 Ca) MG TABS tablet    cholecalciferol (VITAMIN D) 1000 units tablet Take 1,000 Units by mouth daily.   cyanocobalamin 1000 MCG tablet Take 1,000 mcg by mouth daily.   diazepam (VALIUM) 2 MG tablet Take 1 tablet (2 mg total) by mouth every 6 (six) hours as needed (vertigo).   melatonin 3 MG TABS tablet Take 3 mg by mouth at bedtime.   Multiple Vitamin (MULTIVITAMIN) tablet Take 1 tablet by mouth daily.   polyethylene glycol (MIRALAX / GLYCOLAX) packet Take 17 g by mouth daily.   triamcinolone (NASACORT ALLERGY 24HR) 55 MCG/ACT AERO nasal inhaler Place 2 sprays into the nose daily.   vitamin C (ASCORBIC ACID) 500 MG tablet Take 500 mg by mouth daily.       02/03/2023   11:29 AM 07/18/2022    9:29 AM 07/15/2021    9:55 AM 02/06/2021   11:21 AM  GAD 7 : Generalized Anxiety Score  Nervous, Anxious, on Edge 0 0 0 0  Control/stop worrying 0 0 0 0  Worry too much - different things 0 0 0 0  Trouble relaxing 0 0 0 0  Restless 0 0 0 0  Easily annoyed or irritable 0 0 0 0  Afraid - awful might happen 0 0 0 0  Total GAD 7 Score 0 0 0 0  Anxiety Difficulty Not difficult at all Not difficult at all Not difficult at all Not difficult at all       02/03/2023   11:29 AM 07/18/2022    9:28 AM 05/29/2022    3:09 PM  Depression screen PHQ 2/9  Decreased Interest 0 0 0  Down, Depressed, Hopeless 0 0 0  PHQ - 2 Score 0 0 0  Altered sleeping 0 0 0  Tired, decreased energy 0 0 0  Change in appetite 0 0 0  Feeling bad or failure about yourself  0 0 0  Trouble concentrating 0 0 0  Moving slowly  or fidgety/restless 0 0 0  Suicidal thoughts 0 0 0  PHQ-9 Score 0 0 0  Difficult doing work/chores Not difficult at all Not difficult at all Not difficult at all    BP Readings from Last 3 Encounters:  02/03/23 122/78  07/18/22 106/76  02/24/22 124/79    Physical Exam Constitutional:      Appearance: Normal appearance. She is well-developed.  HENT:     Right Ear: Ear canal and external ear normal. Tympanic membrane is not erythematous or retracted.     Left Ear: Ear canal and external ear normal. Tympanic membrane is not erythematous or retracted.     Nose:     Right Sinus: Maxillary sinus tenderness and frontal sinus tenderness present.     Left Sinus: Maxillary sinus tenderness and frontal sinus tenderness present.     Mouth/Throat:     Mouth: No oral lesions.     Pharynx: Uvula midline. Oropharyngeal exudate and posterior oropharyngeal erythema present.  Cardiovascular:     Rate and Rhythm: Normal rate and regular rhythm.     Heart sounds: Normal heart sounds.  Pulmonary:     Effort: Pulmonary effort is normal.     Breath sounds: Normal breath sounds. No decreased breath sounds, wheezing or rales.  Musculoskeletal:     Cervical back: Normal range of motion.  Lymphadenopathy:     Cervical: No cervical adenopathy.  Neurological:     Mental Status: She is alert and oriented to person, place, and time.     Wt Readings from Last 3 Encounters:  02/03/23 163 lb (73.9 kg)  07/18/22 165 lb 3.2 oz (74.9 kg)  05/29/22 166 lb (75.3 kg)    BP 122/78   Pulse 79   Temp 97.7 F (36.5 C) (Oral)   Ht 5' 4.5" (1.638 m)   Wt 163 lb (73.9 kg)   SpO2 97%   BMI 27.55 kg/m   Assessment and Plan:  Problem List Items Addressed This Visit   None Visit Diagnoses     Acute non-recurrent maxillary sinusitis    -  Primary   continue flonase NS, fluids and sudafed bid follow up if no improvement   Relevant Medications   amoxicillin-clavulanate (AUGMENTIN) 875-125 MG tablet        No follow-ups on file.    Reubin Milan, MD [facility] [place]

## 2023-02-17 DIAGNOSIS — M542 Cervicalgia: Secondary | ICD-10-CM | POA: Diagnosis not present

## 2023-02-17 DIAGNOSIS — M9902 Segmental and somatic dysfunction of thoracic region: Secondary | ICD-10-CM | POA: Diagnosis not present

## 2023-02-17 DIAGNOSIS — R42 Dizziness and giddiness: Secondary | ICD-10-CM | POA: Diagnosis not present

## 2023-02-17 DIAGNOSIS — M9901 Segmental and somatic dysfunction of cervical region: Secondary | ICD-10-CM | POA: Diagnosis not present

## 2023-02-24 DIAGNOSIS — L821 Other seborrheic keratosis: Secondary | ICD-10-CM | POA: Diagnosis not present

## 2023-02-24 DIAGNOSIS — D492 Neoplasm of unspecified behavior of bone, soft tissue, and skin: Secondary | ICD-10-CM | POA: Diagnosis not present

## 2023-02-24 DIAGNOSIS — L570 Actinic keratosis: Secondary | ICD-10-CM | POA: Diagnosis not present

## 2023-03-11 ENCOUNTER — Ambulatory Visit
Admission: RE | Admit: 2023-03-11 | Discharge: 2023-03-11 | Disposition: A | Discharge status: Home / Self Care | Payer: Medicare HMO | Source: Ambulatory Visit | Attending: Internal Medicine | Admitting: Internal Medicine

## 2023-03-11 DIAGNOSIS — Z1231 Encounter for screening mammogram for malignant neoplasm of breast: Secondary | ICD-10-CM | POA: Insufficient documentation

## 2023-04-09 DIAGNOSIS — Z03818 Encounter for observation for suspected exposure to other biological agents ruled out: Secondary | ICD-10-CM | POA: Diagnosis not present

## 2023-04-09 DIAGNOSIS — J019 Acute sinusitis, unspecified: Secondary | ICD-10-CM | POA: Diagnosis not present

## 2023-06-11 DIAGNOSIS — D2362 Other benign neoplasm of skin of left upper limb, including shoulder: Secondary | ICD-10-CM | POA: Diagnosis not present

## 2023-06-11 DIAGNOSIS — D485 Neoplasm of uncertain behavior of skin: Secondary | ICD-10-CM | POA: Diagnosis not present

## 2023-06-11 DIAGNOSIS — C44629 Squamous cell carcinoma of skin of left upper limb, including shoulder: Secondary | ICD-10-CM | POA: Diagnosis not present

## 2023-06-18 DIAGNOSIS — H40003 Preglaucoma, unspecified, bilateral: Secondary | ICD-10-CM | POA: Diagnosis not present

## 2023-06-18 DIAGNOSIS — H2513 Age-related nuclear cataract, bilateral: Secondary | ICD-10-CM | POA: Diagnosis not present

## 2023-07-08 DIAGNOSIS — C44629 Squamous cell carcinoma of skin of left upper limb, including shoulder: Secondary | ICD-10-CM | POA: Diagnosis not present

## 2023-07-21 ENCOUNTER — Ambulatory Visit (INDEPENDENT_AMBULATORY_CARE_PROVIDER_SITE_OTHER): Payer: Self-pay | Admitting: Internal Medicine

## 2023-07-21 ENCOUNTER — Encounter: Payer: Self-pay | Admitting: Internal Medicine

## 2023-07-21 VITALS — BP 112/72 | HR 68 | Ht 64.5 in | Wt 166.0 lb

## 2023-07-21 DIAGNOSIS — G43809 Other migraine, not intractable, without status migrainosus: Secondary | ICD-10-CM | POA: Diagnosis not present

## 2023-07-21 DIAGNOSIS — E7800 Pure hypercholesterolemia, unspecified: Secondary | ICD-10-CM

## 2023-07-21 DIAGNOSIS — G4761 Periodic limb movement disorder: Secondary | ICD-10-CM | POA: Diagnosis not present

## 2023-07-21 DIAGNOSIS — Z1231 Encounter for screening mammogram for malignant neoplasm of breast: Secondary | ICD-10-CM

## 2023-07-21 DIAGNOSIS — M8580 Other specified disorders of bone density and structure, unspecified site: Secondary | ICD-10-CM | POA: Diagnosis not present

## 2023-07-21 DIAGNOSIS — Z0000 Encounter for general adult medical examination without abnormal findings: Secondary | ICD-10-CM | POA: Diagnosis not present

## 2023-07-21 DIAGNOSIS — G2581 Restless legs syndrome: Secondary | ICD-10-CM

## 2023-07-21 MED ORDER — ROPINIROLE HCL 0.25 MG PO TABS: 0.2500 mg | ORAL_TABLET | Freq: Every day | ORAL | 0 refills | Status: DC

## 2023-07-21 NOTE — Assessment & Plan Note (Signed)
 Will start Requip 0.25 mg at bedtime.

## 2023-07-21 NOTE — Assessment & Plan Note (Signed)
 Managed with diet only. Lab Results  Component Value Date   LDLCALC 134 (H) 07/18/2022

## 2023-07-21 NOTE — Assessment & Plan Note (Signed)
 Continue calcium and D plus exercise Repeat DEXA 1-2 years

## 2023-07-21 NOTE — Progress Notes (Signed)
 Date:  07/21/2023   Name:  Natalie [name]   DOB:  [birth year]   MRN:  [id number]   Chief Complaint: Annual Exam Natalie [name] is a 68 y.o. female who presents today for her Complete Annual Exam. She feels well. She reports exercising walks 2 miles, 2 - 3 times a week . She reports she is sleeping fairly well. Breast complaints none.  Health Maintenance  Topic Date Due   Zoster (Shingles) Vaccine (1 of 2) Never done   DTaP/Tdap/Td vaccine (2 - Td or Tdap) 06/28/2019   Medicare Annual Wellness Visit  05/30/2023   Flu Shot  11/06/2023   Mammogram  03/10/2024   DEXA scan (bone density measurement)  02/19/2027   Colon Cancer Screening  11/22/2028   Pneumonia Vaccine  Completed   Hepatitis C Screening  Completed   HPV Vaccine  Aged Out   Meningitis B Vaccine  Aged Out   COVID-19 Vaccine  Discontinued    Migraine  This is a recurrent problem. The problem has been unchanged. Associated symptoms include dizziness and tinnitus. Pertinent negatives include no abdominal pain, coughing or weakness.  RLS - continues to be a problem nightly.  Tried gabapentin but she felt hungover the next day.  Review of Systems  Constitutional:  Negative for fatigue and unexpected weight change.  HENT:  Positive for tinnitus. Negative for trouble swallowing.   Eyes:  Negative for visual disturbance.  Respiratory:  Negative for cough, chest tightness, shortness of breath and wheezing.   Cardiovascular:  Negative for chest pain, palpitations and leg swelling.  Gastrointestinal:  Negative for abdominal pain, constipation and diarrhea.  Genitourinary:  Positive for frequency. Negative for dysuria, hematuria and pelvic pain.  Musculoskeletal:  Positive for myalgias (RLS symptoms). Negative for arthralgias.  Neurological:  Positive for dizziness and headaches. Negative for weakness and light-headedness.  Psychiatric/Behavioral:  Positive for sleep disturbance. Negative for dysphoric mood. The patient is  not nervous/anxious.      Lab Results  Component Value Date   NA 143 07/18/2022   K 4.7 07/18/2022   CO2 25 07/18/2022   GLUCOSE 87 07/18/2022   BUN 15 07/18/2022   CREATININE 0.84 07/18/2022   CALCIUM 10.1 07/18/2022   EGFR 76 07/18/2022   GFRNONAA >60 11/21/2020   Lab Results  Component Value Date   CHOL 222 (H) 07/18/2022   HDL 63 07/18/2022   LDLCALC 134 (H) 07/18/2022   TRIG 140 07/18/2022   CHOLHDL 3.5 07/18/2022   Lab Results  Component Value Date   TSH 2.370 07/18/2022   Lab Results  Component Value Date   HGBA1C 5.5 11/27/2017   Lab Results  Component Value Date   WBC 6.6 07/18/2022   HGB 14.7 07/18/2022   HCT 43.8 07/18/2022   MCV 96 07/18/2022   PLT 267 07/18/2022   Lab Results  Component Value Date   ALT 14 07/18/2022   AST 22 07/18/2022   ALKPHOS 103 07/18/2022   BILITOT 0.5 07/18/2022   Lab Results  Component Value Date   VD25OH 48.5 07/18/2022     Patient Active Problem List   Diagnosis Date Noted   History of colonic polyps    Polyp of ascending colon    Restless leg syndrome 03/14/2020   Osteopenia determined by x-ray 09/22/2019   Elevated LDL cholesterol level 07/18/2019   Vestibular migraine 11/27/2017   Hx of diverticulitis of colon    Benign neoplasm of ascending colon    History of colostomy  reversal     Allergies  Allergen Reactions   Codeine Nausea Only   Doxycycline Nausea And Vomiting   Levofloxacin Other (See Comments)    insomnia   Effexor [Venlafaxine] Nausea Only    And dizziness   Sulfa Antibiotics Rash   Topamax [Topiramate] Other (See Comments)    sedation    Past Surgical History:  Procedure Laterality Date   BREAST BIOPSY Right 03/15/2019   stereo biopsy/ x clip/ benign   COLONOSCOPY N/A 11/22/2021   Procedure: COLONOSCOPY;  Surgeon: Midge Minium, MD;  Location: [facility];  Service: Endoscopy;  Laterality: N/A;   COLONOSCOPY WITH PROPOFOL N/A 07/27/2015   Procedure: COLONOSCOPY WITH  PROPOFOL;  Surgeon: Midge Minium, MD;  Location: [facility];  Service: Endoscopy;  Laterality: N/A;   COLONOSCOPY WITH PROPOFOL N/A 03/14/2016   Procedure: COLONOSCOPY WITH PROPOFOL;  Surgeon: Midge Minium, MD;  Location: [facility];  Service: Endoscopy;  Laterality: N/A;  Cannot arrive before 0830 AM   COLOSTOMY REVERSAL N/A 09/26/2015   Procedure: COLOSTOMY REVERSAL / TAKE DOWN;  Surgeon: Gladis Riffle, MD;  Location: [facility];  Service: General;  Laterality: N/A;   LAPAROTOMY N/A 03/01/2015   Procedure: EXPLORATORY LAPAROTOMY, SIGMOID COLECTOMY, COLOSTOMY;  Surgeon: Gladis Riffle, MD;  Location: [facility];  Service: General;  Laterality: N/A;   POLYPECTOMY  03/14/2016   Procedure: POLYPECTOMY;  Surgeon: Midge Minium, MD;  Location: [facility];  Service: Endoscopy;;   POLYPECTOMY  11/22/2021   Procedure: POLYPECTOMY;  Surgeon: Midge Minium, MD;  Location: [facility];  Service: Endoscopy;;    Social History   Tobacco Use   Smoking status: Never   Smokeless tobacco: Never  Vaping Use   Vaping status: Never Used  Substance Use Topics   Alcohol use: No    Alcohol/week: 0.0 standard drinks of alcohol   Drug use: No     Medication list has been reviewed and updated.  Current Meds  Medication Sig   CALCIUM 600 1500 (600 Ca) MG TABS tablet    cholecalciferol (VITAMIN D) 1000 units tablet Take 1,000 Units by mouth daily.   cyanocobalamin 1000 MCG tablet Take 1,000 mcg by mouth daily.   melatonin 3 MG TABS tablet Take 3 mg by mouth at bedtime.   Multiple Vitamin (MULTIVITAMIN) tablet Take 1 tablet by mouth daily.   polyethylene glycol (MIRALAX / GLYCOLAX) packet Take 17 g by mouth daily.   rOPINIRole (REQUIP) 0.25 MG tablet Take 1 tablet (0.25 mg total) by mouth at bedtime.   triamcinolone (NASACORT ALLERGY 24HR) 55 MCG/ACT AERO nasal inhaler Place 2 sprays into the nose daily.   vitamin C (ASCORBIC ACID) 500 MG tablet Take 500 mg by mouth daily.        07/21/2023    9:19 AM 02/03/2023   11:29 AM 07/18/2022    9:29 AM 07/15/2021    9:55 AM  GAD 7 : Generalized Anxiety Score  Nervous, Anxious, on Edge 0 0 0 0  Control/stop worrying 0 0 0 0  Worry too much - different things 0 0 0 0  Trouble relaxing 0 0 0 0  Restless 0 0 0 0  Easily annoyed or irritable 0 0 0 0  Afraid - awful might happen 0 0 0 0  Total GAD 7 Score 0 0 0 0  Anxiety Difficulty Not difficult at all Not difficult at all Not difficult at all Not difficult at all       07/21/2023  9:17 AM 02/03/2023   11:29 AM 07/18/2022    9:28 AM  Depression screen PHQ 2/9  Decreased Interest 0 0 0  Down, Depressed, Hopeless 0 0 0  PHQ - 2 Score 0 0 0  Altered sleeping 0 0 0  Tired, decreased energy 0 0 0  Change in appetite 0 0 0  Feeling bad or failure about yourself  0 0 0  Trouble concentrating 0 0 0  Moving slowly or fidgety/restless 0 0 0  Suicidal thoughts 0 0 0  PHQ-9 Score 0 0 0  Difficult doing work/chores Not difficult at all Not difficult at all Not difficult at all    BP Readings from Last 3 Encounters:  07/21/23 112/72  02/03/23 122/78  07/18/22 106/76    Physical Exam Vitals and nursing note reviewed.  Constitutional:      General: She is not in acute distress.    Appearance: She is well-developed.  HENT:     Head: Normocephalic and atraumatic.     Right Ear: Tympanic membrane and ear canal normal.     Left Ear: Tympanic membrane and ear canal normal.     Nose:     Right Sinus: No maxillary sinus tenderness.     Left Sinus: No maxillary sinus tenderness.  Eyes:     General: No scleral icterus.       Right eye: No discharge.        Left eye: No discharge.     Conjunctiva/sclera: Conjunctivae normal.  Neck:     Thyroid: No thyromegaly.     Vascular: No carotid bruit.  Cardiovascular:     Rate and Rhythm: Normal rate and regular rhythm.     Pulses: Normal pulses.     Heart sounds: Normal heart sounds.  Pulmonary:     Effort:  Pulmonary effort is normal. No respiratory distress.     Breath sounds: No wheezing.  Abdominal:     General: Bowel sounds are normal.     Palpations: Abdomen is soft.     Tenderness: There is no abdominal tenderness.  Musculoskeletal:     Cervical back: Normal range of motion. No erythema.     Right lower leg: No edema.     Left lower leg: No edema.  Lymphadenopathy:     Cervical: No cervical adenopathy.  Skin:    General: Skin is warm and dry.     Findings: No rash.  Neurological:     Mental Status: She is alert and oriented to person, place, and time.     Cranial Nerves: No cranial nerve deficit.     Sensory: No sensory deficit.     Deep Tendon Reflexes: Reflexes are normal and symmetric.  Psychiatric:        Attention and Perception: Attention normal.        Mood and Affect: Mood normal.     Wt Readings from Last 3 Encounters:  07/21/23 166 lb (75.3 kg)  02/03/23 163 lb (73.9 kg)  07/18/22 165 lb 3.2 oz (74.9 kg)    BP 112/72   Pulse 68   Ht 5' 4.5" (1.638 m)   Wt 166 lb (75.3 kg)   SpO2 95%   BMI 28.05 kg/m   Assessment and Plan:  Problem List Items Addressed This Visit       Unprioritized   Vestibular migraine   Intermittent migraines unchanged in character She does not take any medication - but symptoms can last up to three days She is  trying not to use Meclizine; uses low dose valium sometimes      Relevant Orders   CBC with Differential/Platelet   Comprehensive metabolic panel with GFR   Elevated LDL cholesterol level   Managed with diet only. Lab Results  Component Value Date   LDLCALC 134 (H) 07/18/2022         Relevant Orders   Lipid panel   CT CARDIAC SCORING (SELF PAY ONLY)   Osteopenia determined by x-ray   Continue calcium and D plus exercise Repeat DEXA 1-2 years      Relevant Orders   Comprehensive metabolic panel with GFR   Restless leg syndrome   Will start Requip 0.25 mg at bedtime.      Relevant Medications    rOPINIRole (REQUIP) 0.25 MG tablet   Other Relevant Orders   CBC with Differential/Platelet   TSH   Other Visit Diagnoses       Annual physical exam    -  Primary   continue healthy diet and exercise consider Shingrix vaccines at pharmacy     Encounter for screening mammogram for breast cancer       done Dec 2024 - repeat annually       Return in about 6 months (around 01/20/2024) for f/u migraine, RLS.    Sheron Dixons, MD [facility] [place]

## 2023-07-21 NOTE — Assessment & Plan Note (Addendum)
 Intermittent migraines unchanged in character She does not take any medication - but symptoms can last up to three days She is trying not to use Meclizine; uses low dose valium sometimes

## 2023-07-22 ENCOUNTER — Encounter: Payer: Self-pay | Admitting: Internal Medicine

## 2023-07-22 LAB — CBC WITH DIFFERENTIAL/PLATELET
Basophils Absolute: 0.1 10*3/uL (ref 0.0–0.2)
Basos: 1 %
EOS (ABSOLUTE): 0.4 10*3/uL (ref 0.0–0.4)
Eos: 7 %
Hematocrit: 42.4 % (ref 34.0–46.6)
Hemoglobin: 14.3 g/dL (ref 11.1–15.9)
Immature Grans (Abs): 0 10*3/uL (ref 0.0–0.1)
Immature Granulocytes: 0 %
Lymphocytes Absolute: 1.6 10*3/uL (ref 0.7–3.1)
Lymphs: 30 %
MCH: 32.6 pg (ref 26.6–33.0)
MCHC: 33.7 g/dL (ref 31.5–35.7)
MCV: 97 fL (ref 79–97)
Monocytes Absolute: 0.5 10*3/uL (ref 0.1–0.9)
Monocytes: 9 %
Neutrophils Absolute: 2.9 10*3/uL (ref 1.4–7.0)
Neutrophils: 53 %
Platelets: 284 10*3/uL (ref 150–450)
RBC: 4.38 x10E6/uL (ref 3.77–5.28)
RDW: 12.3 % (ref 11.7–15.4)
WBC: 5.5 10*3/uL (ref 3.4–10.8)

## 2023-07-22 LAB — COMPREHENSIVE METABOLIC PANEL WITH GFR
ALT: 15 IU/L (ref 0–32)
AST: 23 IU/L (ref 0–40)
Albumin: 4.6 g/dL (ref 3.9–4.9)
Alkaline Phosphatase: 99 IU/L (ref 44–121)
BUN/Creatinine Ratio: 19 (ref 12–28)
BUN: 16 mg/dL (ref 8–27)
Bilirubin Total: 0.6 mg/dL (ref 0.0–1.2)
CO2: 24 mmol/L (ref 20–29)
Calcium: 9.8 mg/dL (ref 8.7–10.3)
Chloride: 104 mmol/L (ref 96–106)
Creatinine, Ser: 0.85 mg/dL (ref 0.57–1.00)
Globulin, Total: 2.3 g/dL (ref 1.5–4.5)
Glucose: 90 mg/dL (ref 70–99)
Potassium: 5.1 mmol/L (ref 3.5–5.2)
Sodium: 142 mmol/L (ref 134–144)
Total Protein: 6.9 g/dL (ref 6.0–8.5)
eGFR: 74 mL/min/{1.73_m2} (ref >59)

## 2023-07-22 LAB — LIPID PANEL
Chol/HDL Ratio: 3.3 ratio (ref 0.0–4.4)
Cholesterol, Total: 212 mg/dL — ABNORMAL HIGH (ref 100–199)
HDL: 64 mg/dL (ref >39)
LDL Chol Calc (NIH): 128 mg/dL — ABNORMAL HIGH (ref 0–99)
Triglycerides: 116 mg/dL (ref 0–149)
VLDL Cholesterol Cal: 20 mg/dL (ref 5–40)

## 2023-07-22 LAB — TSH: TSH: 3.78 u[IU]/mL (ref 0.450–4.500)

## 2023-07-23 ENCOUNTER — Ambulatory Visit (INDEPENDENT_AMBULATORY_CARE_PROVIDER_SITE_OTHER): Admitting: Emergency Medicine

## 2023-07-23 VITALS — Ht 64.5 in | Wt 166.0 lb

## 2023-07-23 DIAGNOSIS — Z0000 Encounter for general adult medical examination without abnormal findings: Secondary | ICD-10-CM

## 2023-07-23 NOTE — Progress Notes (Signed)
 Subjective:   Natalie [name] is a 68 y.o. who presents for a Medicare Wellness preventive visit.  Visit Complete: Virtual I connected with  Natalie [name] on 07/23/23 by a audio enabled telemedicine application and verified that I am speaking with the correct person using two identifiers.  Patient Location: Home  Provider Location: Home Office  I discussed the limitations of evaluation and management by telemedicine. The patient expressed understanding and agreed to proceed.  Vital Signs: Because this visit was a virtual/telehealth visit, some criteria may be missing or patient reported. Any vitals not documented were not able to be obtained and vitals that have been documented are patient reported.  VideoDeclined- This patient declined Librarian, academic. Therefore the visit was completed with audio only.  Persons Participating in Visit: Patient.  AWV Questionnaire: No: Patient Medicare AWV questionnaire was not completed prior to this visit.  Cardiac Risk Factors include: advanced age (>16men, >31 women);dyslipidemia     Objective:    Today's Vitals   07/23/23 0757  Weight: 166 lb (75.3 kg)  Height: 5' 4.5" (1.638 m)   Body mass index is 28.05 kg/m.     07/23/2023    8:10 AM 05/29/2022    3:11 PM 11/22/2021    6:36 AM 07/09/2021    9:01 AM 05/22/2021   10:56 AM 11/21/2020    5:40 PM 05/16/2020   11:13 AM  Advanced Directives  Does Patient Have a Medical Advance Directive? Yes No Yes Yes Yes No Yes  Type of Estate agent of [place];Living will  Healthcare Power of [place];Living will Healthcare Power of [place];Living will Healthcare Power of [place];Living will  Healthcare Power of [place];Living will  Does patient want to make changes to medical advance directive? No - Patient declined  No - Patient declined      Copy of Healthcare Power of Attorney in Chart? No - copy requested  No - copy requested  No - copy  requested  No - copy requested  Would patient like information on creating a medical advance directive?  No - Patient declined    No - Guardian declined     Current Medications (verified) Outpatient Encounter Medications as of 07/23/2023  Medication Sig   CALCIUM 600 1500 (600 Ca) MG TABS tablet    cholecalciferol (VITAMIN D) 1000 units tablet Take 1,000 Units by mouth daily.   cyanocobalamin 1000 MCG tablet Take 1,000 mcg by mouth daily.   fluticasone (FLONASE) 50 MCG/ACT nasal spray Place 1 spray into both nostrils daily.   melatonin 3 MG TABS tablet Take 3 mg by mouth at bedtime.   Multiple Vitamin (MULTIVITAMIN) tablet Take 1 tablet by mouth daily.   polyethylene glycol (MIRALAX / GLYCOLAX) packet Take 17 g by mouth daily.   rOPINIRole (REQUIP) 0.25 MG tablet Take 1 tablet (0.25 mg total) by mouth at bedtime.   vitamin C (ASCORBIC ACID) 500 MG tablet Take 500 mg by mouth daily.   triamcinolone (NASACORT ALLERGY 24HR) 55 MCG/ACT AERO nasal inhaler Place 2 sprays into the nose daily. (Patient not taking: Reported on 07/23/2023)   No facility-administered encounter medications on file as of 07/23/2023.    Allergies (verified) Codeine, Doxycycline, Levofloxacin, Effexor [venlafaxine], Sulfa antibiotics, and Topamax [topiramate]   History: Past Medical History:  Diagnosis Date   Benign neoplasm of sigmoid colon    Chronic constipation    Chronic sinusitis    Cystitis    Diverticulitis    Perforated bowel (HCC)  Pleural effusion on right 2016   Preventative health care 05/24/2015   Wears dentures    partial upper   Past Surgical History:  Procedure Laterality Date   BREAST BIOPSY Right 03/15/2019   stereo biopsy/ x clip/ benign   COLONOSCOPY N/A 11/22/2021   Procedure: COLONOSCOPY;  Surgeon: Marnee Sink, MD;  Location: [facility];  Service: Endoscopy;  Laterality: N/A;   COLONOSCOPY WITH PROPOFOL N/A 07/27/2015   Procedure: COLONOSCOPY WITH PROPOFOL;  Surgeon: Marnee Sink, MD;  Location: [facility];  Service: Endoscopy;  Laterality: N/A;   COLONOSCOPY WITH PROPOFOL N/A 03/14/2016   Procedure: COLONOSCOPY WITH PROPOFOL;  Surgeon: Marnee Sink, MD;  Location: [facility];  Service: Endoscopy;  Laterality: N/A;  Cannot arrive before 0830 AM   COLOSTOMY REVERSAL N/A 09/26/2015   Procedure: COLOSTOMY REVERSAL / TAKE DOWN;  Surgeon: Kandis Ormond, MD;  Location: [facility] ORS;  Service: General;  Laterality: N/A;   LAPAROTOMY N/A 03/01/2015   Procedure: EXPLORATORY LAPAROTOMY, SIGMOID COLECTOMY, COLOSTOMY;  Surgeon: Kandis Ormond, MD;  Location: [facility] OR[facility];  Service: General;  Laterality: N/A;   POLYPECTOMY  03/14/2016   Procedure: POLYPECTOMY;  Surgeon: Marnee Sink, MD;  Location: [facility];  Service: Endoscopy;;   POLYPECTOMY  11/22/2021   Procedure: POLYPECTOMY;  Surgeon: Marnee Sink, MD;  Location: [facility];  Service: Endoscopy;;   Family History  Problem Relation Age of Onset   Stroke Mother    Heart attack Mother 13   Arthritis Father    Lung cancer Father    Heart attack Brother    Colon cancer Maternal Aunt    Breast cancer Paternal Aunt    Social History   Socioeconomic History   Marital status: Married    Spouse name: Natalie [name]   Number of children: 0   Years of education: Not on file   Highest education level: Not on file  Occupational History   Occupation: Retired    Comment: Manufacturing systems engineer  Tobacco Use   Smoking status: Never    Passive exposure: Past   Smokeless tobacco: Never  Vaping Use   Vaping status: Never Used  Substance and Sexual Activity   Alcohol use: No    Alcohol/week: 0.0 standard drinks of alcohol   Drug use: No   Sexual activity: Not on file  Other Topics Concern   Not on file  Social History Narrative   Married. No children of her own but has step children   Worked for [employer] in [place]. Retired.    Is a Designer, jewellery.   Enjoys spending time with family,  reading, walking. She is raising her granddaughter.   Social Drivers of Corporate investment banker Strain: Low Risk  (07/23/2023)   Overall Financial Resource Strain (CARDIA)    Difficulty of Paying Living Expenses: Not hard at all  Food Insecurity: No Food Insecurity (07/23/2023)   Hunger Vital Sign    Worried About Running Out of Food in the Last Year: Never true    Ran Out of Food in the Last Year: Never true  Transportation Needs: No Transportation Needs (07/23/2023)   PRAPARE - Administrator, Civil Service (Medical): No    Lack of Transportation (Non-Medical): No  Physical Activity: Insufficiently Active (07/23/2023)   Exercise Vital Sign    Days of Exercise per Week: 3 days    Minutes of Exercise per Session: 40 min  Stress: No Stress Concern Present (07/23/2023)   [place]  Institute of Occupational Health - Occupational Stress Questionnaire    Feeling of Stress : Not at all  Social Connections: Socially Integrated (07/23/2023)   Social Connection and Isolation Panel [NHANES]    Frequency of Communication with Friends and Family: More than three times a week    Frequency of Social Gatherings with Friends and Family: Three times a week    Attends Religious Services: More than 4 times per year    Active Member of Clubs or Organizations: Yes    Attends Engineer, structural: More than 4 times per year    Marital Status: Married    Tobacco Counseling Counseling given: Not Answered    Clinical Intake:  Pre-visit preparation completed: Yes  Pain : No/denies pain     BMI - recorded: 28.05 Nutritional Risks: None Diabetes: No  Lab Results  Component Value Date   HGBA1C 5.5 11/27/2017     How often do you need to have someone help you when you read instructions, pamphlets, or other written materials from your doctor or pharmacy?: 1 - Never  Interpreter Needed?: No  Information entered by :: Jaunita Messier, CMA   Activities of Daily Living      07/23/2023    7:58 AM  In your present state of health, do you have any difficulty performing the following activities:  Hearing? 0  Vision? 0  Difficulty concentrating or making decisions? 0  Walking or climbing stairs? 0  Dressing or bathing? 0  Doing errands, shopping? 1  Comment doesn't drive, husband drives to appointments  Preparing Food and eating ? N  Using the Toilet? N  In the past six months, have you accidently leaked urine? N  Do you have problems with loss of bowel control? N  Managing your Medications? N  Managing your Finances? N  Housekeeping or managing your Housekeeping? N    Patient Care Team: Sheron Dixons, MD as PCP - General (Internal Medicine) Marnee Sink, MD as Consulting Physician (Gastroenterology) Mariane Shire, MD as Referring Physician (Dermatology) Angeline Kemps, DC as Referring Physician (Chiropractic Medicine) Rosa College, MD as Consulting Physician (Ophthalmology)  Indicate any recent Medical Services you may have received from other than [facility] providers in the past year (date may be approximate).     Assessment:   This is a routine wellness examination for Natalie [name].  Hearing/Vision screen Hearing Screening - Comments:: Denies hearing loss Vision Screening - Comments:: Gets eye exams, Dr. Dusty Gin, [place]   Goals Addressed             This Visit's Progress    Patient Stated       Exercise more regularly       Depression Screen     07/23/2023    8:06 AM 07/21/2023    9:17 AM 02/03/2023   11:29 AM 07/18/2022    9:28 AM 05/29/2022    3:09 PM 07/15/2021    9:55 AM 05/22/2021   10:55 AM  PHQ 2/9 Scores  PHQ - 2 Score 0 0 0 0 0 0 0  PHQ- 9 Score 0 0 0 0 0 0     Fall Risk     07/23/2023    8:11 AM 07/21/2023    9:17 AM 02/03/2023   11:29 AM 07/18/2022    9:29 AM 05/29/2022    3:12 PM  Fall Risk   Falls in the past year? 0 0 0 0 0  Number falls in past yr: 0 0 0  0 0  Injury with Fall? 0 0 0 0 0   Risk for fall due to : No Fall Risks No Fall Risks No Fall Risks No Fall Risks No Fall Risks  Follow up Falls prevention discussed;Falls evaluation completed Falls evaluation completed Falls evaluation completed Falls evaluation completed Falls prevention discussed;Falls evaluation completed    MEDICARE RISK AT HOME:  Medicare Risk at Home Any stairs in or around the home?: Yes If so, are there any without handrails?: No Home free of loose throw rugs in walkways, pet beds, electrical cords, etc?: Yes Adequate lighting in your home to reduce risk of falls?: Yes Life alert?: No Use of a cane, walker or w/c?: No Grab bars in the bathroom?: Yes Shower chair or bench in shower?: Yes Elevated toilet seat or a handicapped toilet?: Yes  TIMED UP AND GO:  Was the test performed?  No  Cognitive Function: 6CIT completed        07/23/2023    8:12 AM 05/29/2022    3:13 PM  6CIT Screen  What Year? 0 points 0 points  What month? 0 points 0 points  What time? 0 points 0 points  Count back from 20 0 points 0 points  Months in reverse 0 points 2 points  Repeat phrase 0 points 0 points  Total Score 0 points 2 points    Immunizations Immunization History  Administered Date(s) Administered   Influenza Inj Mdck Quad Pf 02/05/2018   Influenza,inj,Quad PF,6+ Mos 02/05/2018   Influenza-Unspecified 01/18/2015, 01/02/2016, 02/11/2017   PNEUMOCOCCAL CONJUGATE-20 07/15/2021   Tdap 06/27/2009    Screening Tests Health Maintenance  Topic Date Due   Zoster Vaccines- Shingrix (1 of 2) Never done   DTaP/Tdap/Td (2 - Td or Tdap) 06/28/2019   INFLUENZA VACCINE  11/06/2023   MAMMOGRAM  03/10/2024   Medicare Annual Wellness (AWV)  07/22/2024   DEXA SCAN  02/19/2027   Colonoscopy  11/22/2028   Pneumonia Vaccine 56+ Years old  Completed   Hepatitis C Screening  Completed   HPV VACCINES  Aged Out   Meningococcal B Vaccine  Aged Out   COVID-19 Vaccine  Discontinued    Health  Maintenance  Health Maintenance Due  Topic Date Due   Zoster Vaccines- Shingrix (1 of 2) Never done   DTaP/Tdap/Td (2 - Td or Tdap) 06/28/2019   Health Maintenance Items Addressed: See Nurse Notes  Additional Screening:  Vision Screening: Recommended annual ophthalmology exams for early detection of glaucoma and other disorders of the eye.  Dental Screening: Recommended annual dental exams for proper oral hygiene  Community Resource Referral / Chronic Care Management: CRR required this visit?  No   CCM required this visit?  No     Plan:     I have personally reviewed and noted the following in the patient's chart:   Medical and social history Use of alcohol, tobacco or illicit drugs  Current medications and supplements including opioid prescriptions. Patient is not currently taking opioid prescriptions. Functional ability and status Nutritional status Physical activity Advanced directives List of other physicians Hospitalizations, surgeries, and ER visits in previous 12 months Vitals Screenings to include cognitive, depression, and falls Referrals and appointments  In addition, I have reviewed and discussed with patient certain preventive protocols, quality metrics, and best practice recommendations. A written personalized care plan for preventive services as well as general preventive health recommendations were provided to patient.     Jaunita Messier, CMA   07/23/2023   After Visit  Summary: (MyChart) Due to this being a telephonic visit, the after visit summary with patients personalized plan was offered to patient via MyChart   Notes:  Needs Tdap and Shingrix vaccines (pharmacy) Declined Covid vaccine

## 2023-07-23 NOTE — Patient Instructions (Addendum)
 Ms. Uddin , Thank you for taking time to come for your Medicare Wellness Visit. I appreciate your ongoing commitment to your health goals. Please review the following plan we discussed and let me know if I can assist you in the future.   Referrals/Orders/Follow-Ups/Clinician Recommendations: Get the tetanus and shingles vaccines at your local pharmacy.  This is a list of the screening recommended for you and due dates:  Health Maintenance  Topic Date Due   Zoster (Shingles) Vaccine (1 of 2) Never done   DTaP/Tdap/Td vaccine (2 - Td or Tdap) 06/28/2019   Flu Shot  11/06/2023   Mammogram  03/10/2024   Medicare Annual Wellness Visit  07/22/2024   DEXA scan (bone density measurement)  02/19/2027   Colon Cancer Screening  11/22/2028   Pneumonia Vaccine  Completed   Hepatitis C Screening  Completed   HPV Vaccine  Aged Out   Meningitis B Vaccine  Aged Out   COVID-19 Vaccine  Discontinued    Advanced directives: (Copy Requested) Please bring a copy of your health care power of attorney and living will to the office to be added to your chart at your convenience. You can mail to [facility] [address] [place], [place] [address] or email to [email or web address]  Next Medicare Annual Wellness Visit scheduled for next year: Yes, 08/04/24 @ 8:00am (phone visit)

## 2023-08-03 ENCOUNTER — Ambulatory Visit
Admission: RE | Admit: 2023-08-03 | Discharge: 2023-08-03 | Disposition: A | Discharge status: Home / Self Care | Payer: Self-pay | Source: Ambulatory Visit | Attending: Internal Medicine | Admitting: Internal Medicine

## 2023-08-03 DIAGNOSIS — E7800 Pure hypercholesterolemia, unspecified: Secondary | ICD-10-CM | POA: Insufficient documentation

## 2023-08-04 ENCOUNTER — Encounter: Payer: Self-pay | Admitting: Internal Medicine

## 2023-08-17 ENCOUNTER — Other Ambulatory Visit: Payer: Self-pay | Admitting: Internal Medicine

## 2023-08-17 DIAGNOSIS — G2581 Restless legs syndrome: Secondary | ICD-10-CM

## 2023-08-18 NOTE — Telephone Encounter (Signed)
 Requested medications are due for refill today.  yes  Requested medications are on the active medications list.  yes  Last refill. #30 0 rf  Future visit scheduled.   yes  Notes to clinic.  New medication to this pt.    Requested Prescriptions  Pending Prescriptions Disp Refills   rOPINIRole  (REQUIP ) 0.25 MG tablet [Pharmacy Med Name: ROPINIROLE  HCL 0.25 MG TABLET] 30 tablet 0    Sig: TAKE 1 TABLET BY MOUTH AT BEDTIME.     Neurology:  Parkinsonian Agents Passed - 08/18/2023  2:49 PM      Passed - Last BP in normal range    BP Readings from Last 1 Encounters:  07/21/23 112/72         Passed - Last Heart Rate in normal range    Pulse Readings from Last 1 Encounters:  07/21/23 68         Passed - Valid encounter within last 12 months    Recent Outpatient Visits           4 weeks ago Annual physical exam   [facility] at [facility], [place], MD

## 2023-08-18 NOTE — Telephone Encounter (Signed)
 Med refill

## 2023-08-27 DIAGNOSIS — M9901 Segmental and somatic dysfunction of cervical region: Secondary | ICD-10-CM | POA: Diagnosis not present

## 2023-08-27 DIAGNOSIS — M9902 Segmental and somatic dysfunction of thoracic region: Secondary | ICD-10-CM | POA: Diagnosis not present

## 2023-08-27 DIAGNOSIS — M62830 Muscle spasm of back: Secondary | ICD-10-CM | POA: Diagnosis not present

## 2023-08-27 DIAGNOSIS — M542 Cervicalgia: Secondary | ICD-10-CM | POA: Diagnosis not present

## 2023-09-02 DIAGNOSIS — M9902 Segmental and somatic dysfunction of thoracic region: Secondary | ICD-10-CM | POA: Diagnosis not present

## 2023-09-02 DIAGNOSIS — M542 Cervicalgia: Secondary | ICD-10-CM | POA: Diagnosis not present

## 2023-09-02 DIAGNOSIS — M62830 Muscle spasm of back: Secondary | ICD-10-CM | POA: Diagnosis not present

## 2023-09-02 DIAGNOSIS — M9901 Segmental and somatic dysfunction of cervical region: Secondary | ICD-10-CM | POA: Diagnosis not present

## 2023-09-09 DIAGNOSIS — M542 Cervicalgia: Secondary | ICD-10-CM | POA: Diagnosis not present

## 2023-09-09 DIAGNOSIS — M62830 Muscle spasm of back: Secondary | ICD-10-CM | POA: Diagnosis not present

## 2023-09-09 DIAGNOSIS — M9902 Segmental and somatic dysfunction of thoracic region: Secondary | ICD-10-CM | POA: Diagnosis not present

## 2023-09-09 DIAGNOSIS — M9901 Segmental and somatic dysfunction of cervical region: Secondary | ICD-10-CM | POA: Diagnosis not present

## 2023-09-30 DIAGNOSIS — D2362 Other benign neoplasm of skin of left upper limb, including shoulder: Secondary | ICD-10-CM | POA: Diagnosis not present

## 2023-09-30 DIAGNOSIS — D499 Neoplasm of unspecified behavior of unspecified site: Secondary | ICD-10-CM | POA: Diagnosis not present

## 2023-10-27 ENCOUNTER — Ambulatory Visit (INDEPENDENT_AMBULATORY_CARE_PROVIDER_SITE_OTHER): Admitting: Internal Medicine

## 2023-10-27 ENCOUNTER — Encounter: Payer: Self-pay | Admitting: Internal Medicine

## 2023-10-27 ENCOUNTER — Ambulatory Visit: Payer: Self-pay

## 2023-10-27 VITALS — BP 108/70 | HR 82 | Temp 97.9°F | Ht 64.5 in | Wt 169.0 lb

## 2023-10-27 DIAGNOSIS — H1033 Unspecified acute conjunctivitis, bilateral: Secondary | ICD-10-CM | POA: Diagnosis not present

## 2023-10-27 DIAGNOSIS — J0100 Acute maxillary sinusitis, unspecified: Secondary | ICD-10-CM

## 2023-10-27 MED ORDER — AMOXICILLIN-POT CLAVULANATE 875-125 MG PO TABS: 1.0000 | ORAL_TABLET | Freq: Two times a day (BID) | ORAL | 0 refills | Status: AC

## 2023-10-27 NOTE — Telephone Encounter (Signed)
 Noted  Pt has a appt.  KP

## 2023-10-27 NOTE — Telephone Encounter (Signed)
 FYI Only or Action Required?: FYI only for provider.  Patient was last seen in primary care on 07/21/2023 by Justus Leita DEL, MD.  Called Nurse Triage reporting Eye Problem/discharge.  Symptoms began several days ago.  Interventions attempted: Nothing.  Symptoms are: gradually worsening.  Triage Disposition: See HCP Within 4 Hours (Or PCP Triage)  Patient/caregiver understands and will follow disposition?: Yes, will follow disposition  Copied from CRM [id number]. Topic: Clinical - Red Word Triage >> Oct 27, 2023  8:11 AM Charlet HERO wrote: Red Word that prompted transfer to Nurse Triage: Patient is calling about congestion and infection in right eye itching and burning . She also has drainage that is matting started on yesterday, it is red but not swollen. Reason for Disposition  Blurred vision  Answer Assessment - Initial Assessment Questions 1. EYE DISCHARGE: Is the discharge in one or both eyes? What color is it? How much is there? When did the discharge start?      R eye, white discharge, couple days 2. REDNESS OF SCLERA: Is there redness in the white of the eye? If Yes, ask: Is it in one or both eyes? When did the redness start?     yes 3. EYELIDS: Are the eyelids red or swollen? If Yes, ask: How much?      Denies swelling 4. VISION: Do you have blurred vision?     Blurry d/t mucus 5. PAIN: Is there any pain? If Yes, ask: How bad is the pain? (Scale 0-10; or none, mild, moderate, severe)     irritated 6. CONTACT LENS: Do you wear contacts?     denies 7. OTHER SYMPTOMS: Do you have any other symptoms? (e.g., fever, runny nose, cough)     Nasal congestion  Protocols used: Eye - Pus or Discharge-A-AH

## 2023-10-27 NOTE — Progress Notes (Signed)
 Date:  10/27/2023   Name:  Natalie [name]   DOB:  1955/04/16   MRN:  [id number]   Chief Complaint: Eye Drainage (Rt eye X itching and drainage. X 2 days.) and Sinusitis (X 2 weeks. Sinus drainage, and pressure. Headache. No fever, SOB, or cough.)  Sinusitis This is a new problem. The current episode started 1 to 4 weeks ago. The problem has been gradually worsening since onset. There has been no fever. Associated symptoms include congestion and sinus pressure. Pertinent negatives include no chills, headaches or shortness of breath.  Conjunctivitis  The current episode started 2 days ago. The onset was sudden. The problem occurs continuously. The problem has been unchanged. Associated symptoms include congestion. Pertinent negatives include no fever, no headaches and no wheezing.    Review of Systems  Constitutional:  Negative for chills, fatigue and fever.  HENT:  Positive for congestion and sinus pressure.   Respiratory:  Negative for shortness of breath and wheezing.   Cardiovascular:  Negative for chest pain.  Neurological:  Positive for dizziness and light-headedness. Negative for headaches.  Psychiatric/Behavioral:  Negative for dysphoric mood and sleep disturbance. The patient is not nervous/anxious.      Lab Results  Component Value Date   NA 142 07/21/2023   K 5.1 07/21/2023   CO2 24 07/21/2023   GLUCOSE 90 07/21/2023   BUN 16 07/21/2023   CREATININE 0.85 07/21/2023   CALCIUM 9.8 07/21/2023   EGFR 74 07/21/2023   GFRNONAA >60 11/21/2020   Lab Results  Component Value Date   CHOL 212 (H) 07/21/2023   HDL 64 07/21/2023   LDLCALC 128 (H) 07/21/2023   TRIG 116 07/21/2023   CHOLHDL 3.3 07/21/2023   Lab Results  Component Value Date   TSH 3.780 07/21/2023   Lab Results  Component Value Date   HGBA1C 5.5 11/27/2017   Lab Results  Component Value Date   WBC 5.5 07/21/2023   HGB 14.3 07/21/2023   HCT 42.4 07/21/2023   MCV 97 07/21/2023   PLT 284 07/21/2023    Lab Results  Component Value Date   ALT 15 07/21/2023   AST 23 07/21/2023   ALKPHOS 99 07/21/2023   BILITOT 0.6 07/21/2023   Lab Results  Component Value Date   VD25OH 48.5 07/18/2022     Patient Active Problem List   Diagnosis Date Noted   History of colonic polyps    Polyp of ascending colon    Restless leg syndrome 03/14/2020   Osteopenia determined by x-ray 09/22/2019   Elevated LDL cholesterol level 07/18/2019   Vestibular migraine 11/27/2017   Hx of diverticulitis of colon    Benign neoplasm of ascending colon    History of colostomy reversal     Allergies  Allergen Reactions   Codeine Nausea Only   Doxycycline  Nausea And Vomiting   Levofloxacin Other (See Comments)    insomnia   Effexor  [Venlafaxine ] Nausea Only    And dizziness   Sulfa Antibiotics Rash   Topamax [Topiramate] Other (See Comments)    sedation    Past Surgical History:  Procedure Laterality Date   BREAST BIOPSY Right 03/15/2019   stereo biopsy/ x clip/ benign   COLONOSCOPY N/A 11/22/2021   Procedure: COLONOSCOPY;  Surgeon: Jinny Carmine, MD;  Location: [facility];  Service: Endoscopy;  Laterality: N/A;   COLONOSCOPY WITH PROPOFOL  N/A 07/27/2015   Procedure: COLONOSCOPY WITH PROPOFOL ;  Surgeon: Carmine Jinny, MD;  Location: [facility];  Service: Endoscopy;  Laterality: N/A;   COLONOSCOPY WITH PROPOFOL  N/A 03/14/2016   Procedure: COLONOSCOPY WITH PROPOFOL ;  Surgeon: Rogelia Copping, MD;  Location: [facility];  Service: Endoscopy;  Laterality: N/A;  Cannot arrive before 0830 AM   COLOSTOMY REVERSAL N/A 09/26/2015   Procedure: COLOSTOMY REVERSAL / TAKE DOWN;  Surgeon: Dorothyann LITTIE Husk, MD;  Location: [facility];  Service: General;  Laterality: N/A;   LAPAROTOMY N/A 03/01/2015   Procedure: EXPLORATORY LAPAROTOMY, SIGMOID COLECTOMY, COLOSTOMY;  Surgeon: Dorothyann LITTIE Husk, MD;  Location: [facility];  Service: General;  Laterality: N/A;   POLYPECTOMY  03/14/2016   Procedure:  POLYPECTOMY;  Surgeon: Rogelia Copping, MD;  Location: [facility];  Service: Endoscopy;;   POLYPECTOMY  11/22/2021   Procedure: POLYPECTOMY;  Surgeon: Copping Rogelia, MD;  Location: [facility];  Service: Endoscopy;;    Social History   Tobacco Use   Smoking status: Never    Passive exposure: Past   Smokeless tobacco: Never  Vaping Use   Vaping status: Never Used  Substance Use Topics   Alcohol use: No    Alcohol/week: 0.0 standard drinks of alcohol   Drug use: No     Medication list has been reviewed and updated.  Current Meds  Medication Sig   amoxicillin -clavulanate (AUGMENTIN ) 875-125 MG tablet Take 1 tablet by mouth 2 (two) times daily for 10 days.   CALCIUM 600 1500 (600 Ca) MG TABS tablet    cholecalciferol (VITAMIN D ) 1000 units tablet Take 1,000 Units by mouth daily.   cyanocobalamin 1000 MCG tablet Take 1,000 mcg by mouth daily.   fluticasone  (FLONASE ) 50 MCG/ACT nasal spray Place 1 spray into both nostrils daily.   melatonin 3 MG TABS tablet Take 3 mg by mouth at bedtime.   Multiple Vitamin (MULTIVITAMIN) tablet Take 1 tablet by mouth daily.   polyethylene glycol (MIRALAX  / GLYCOLAX ) packet Take 17 g by mouth daily.   rOPINIRole  (REQUIP ) 0.25 MG tablet TAKE 1 TABLET BY MOUTH AT BEDTIME.   triamcinolone  (NASACORT  ALLERGY 24HR) 55 MCG/ACT AERO nasal inhaler Place 2 sprays into the nose daily.   vitamin C (ASCORBIC ACID) 500 MG tablet Take 500 mg by mouth daily.       10/27/2023   10:19 AM 07/21/2023    9:19 AM 02/03/2023   11:29 AM 07/18/2022    9:29 AM  GAD 7 : Generalized Anxiety Score  Nervous, Anxious, on Edge 0 0 0 0  Control/stop worrying 0 0 0 0  Worry too much - different things 0 0 0 0  Trouble relaxing 0 0 0 0  Restless 0 0 0 0  Easily annoyed or irritable 0 0 0 0  Afraid - awful might happen 0 0 0 0  Total GAD 7 Score 0 0 0 0  Anxiety Difficulty Not difficult at all Not difficult at all Not difficult at all Not difficult at all        10/27/2023   10:18 AM 07/23/2023    8:06 AM 07/21/2023    9:17 AM  Depression screen PHQ 2/9  Decreased Interest 0 0 0  Down, Depressed, Hopeless 0 0 0  PHQ - 2 Score 0 0 0  Altered sleeping 0 0 0  Tired, decreased energy 0 0 0  Change in appetite 0 0 0  Feeling bad or failure about yourself  0 0 0  Trouble concentrating 0 0 0  Moving slowly or fidgety/restless 0 0 0  Suicidal thoughts 0 0 0  PHQ-9 Score 0  0 0  Difficult doing work/chores Not difficult at all Not difficult at all Not difficult at all    BP Readings from Last 3 Encounters:  10/27/23 108/70  07/21/23 112/72  02/03/23 122/78    Physical Exam Vitals and nursing note reviewed.  Constitutional:      General: She is not in acute distress.    Appearance: She is well-developed.  HENT:     Head: Normocephalic and atraumatic.     Right Ear: Ear canal and external ear normal. Tympanic membrane is retracted. Tympanic membrane is not erythematous.     Left Ear: Ear canal and external ear normal. Tympanic membrane is retracted. Tympanic membrane is not erythematous.     Nose:     Right Sinus: Maxillary sinus tenderness present. No frontal sinus tenderness.     Left Sinus: Maxillary sinus tenderness present. No frontal sinus tenderness.     Mouth/Throat:     Mouth: No oral lesions.     Pharynx: Uvula midline. No oropharyngeal exudate or posterior oropharyngeal erythema.  Eyes:     Extraocular Movements: Extraocular movements intact.     Conjunctiva/sclera: Conjunctivae normal.  Cardiovascular:     Rate and Rhythm: Normal rate and regular rhythm.     Heart sounds: Normal heart sounds.  Pulmonary:     Effort: Pulmonary effort is normal. No respiratory distress.     Breath sounds: Normal breath sounds. No wheezing or rales.  Lymphadenopathy:     Cervical: No cervical adenopathy.  Skin:    General: Skin is warm and dry.     Findings: No rash.  Neurological:     Mental Status: She is alert and oriented to person,  place, and time.  Psychiatric:        Mood and Affect: Mood normal.        Behavior: Behavior normal.     Wt Readings from Last 3 Encounters:  10/27/23 169 lb (76.7 kg)  07/23/23 166 lb (75.3 kg)  07/21/23 166 lb (75.3 kg)    BP 108/70   Pulse 82   Temp 97.9 F (36.6 C) (Oral)   Ht 5' 4.5 (1.638 m)   Wt 169 lb (76.7 kg)   SpO2 98%   BMI 28.56 kg/m   Assessment and Plan:  Problem List Items Addressed This Visit   None Visit Diagnoses       Acute non-recurrent maxillary sinusitis    -  Primary   continue Neti pot rinses; decongestants and fluids Augmentin  x 10 days follow up if needed   Relevant Medications   amoxicillin -clavulanate (AUGMENTIN ) 875-125 MG tablet     Acute bacterial conjunctivitis of both eyes       rec Cipro  ophthalmic drops qid x 5 days       No follow-ups on file.    Leita HILARIO Adie, MD [facility] [place]

## 2024-01-06 ENCOUNTER — Ambulatory Visit (INDEPENDENT_AMBULATORY_CARE_PROVIDER_SITE_OTHER): Admitting: Internal Medicine

## 2024-01-06 ENCOUNTER — Encounter: Payer: Self-pay | Admitting: Internal Medicine

## 2024-01-06 VITALS — BP 108/70 | HR 88 | Ht 64.5 in | Wt 159.0 lb

## 2024-01-06 DIAGNOSIS — G2581 Restless legs syndrome: Secondary | ICD-10-CM | POA: Diagnosis not present

## 2024-01-06 DIAGNOSIS — G43809 Other migraine, not intractable, without status migrainosus: Secondary | ICD-10-CM

## 2024-01-06 DIAGNOSIS — M8580 Other specified disorders of bone density and structure, unspecified site: Secondary | ICD-10-CM

## 2024-01-06 DIAGNOSIS — Z23 Encounter for immunization: Secondary | ICD-10-CM | POA: Diagnosis not present

## 2024-01-06 DIAGNOSIS — Z1231 Encounter for screening mammogram for malignant neoplasm of breast: Secondary | ICD-10-CM

## 2024-01-06 DIAGNOSIS — F5101 Primary insomnia: Secondary | ICD-10-CM | POA: Diagnosis not present

## 2024-01-06 MED ORDER — DIAZEPAM 2 MG PO TABS: 2.0000 mg | ORAL_TABLET | Freq: Four times a day (QID) | ORAL | 0 refills | Status: AC | PRN

## 2024-01-06 NOTE — Assessment & Plan Note (Signed)
 Non specific, age related sleep disturbance Continue melatonin, low dose benadryl  to reduce itching

## 2024-01-06 NOTE — Assessment & Plan Note (Signed)
 Symptoms resolved; no longer taking Requip 

## 2024-01-06 NOTE — Assessment & Plan Note (Signed)
 Migraines unchanged She take tylenol  if headache occurs.  Occasional valium  if she has vertigo

## 2024-01-06 NOTE — Assessment & Plan Note (Signed)
 Continue calcium and vitamin D  Repeat DEXA this December

## 2024-01-06 NOTE — Patient Instructions (Addendum)
 Call [facility] to schedule your mammogram and DEXA scan at [phone].

## 2024-01-06 NOTE — Progress Notes (Signed)
 Date:  01/06/2024   Name:  Natalie [name]   DOB:  Nov 20, 1955   MRN:  [id number]   Chief Complaint: Migraine and RLS  Migraine  This is a recurrent problem. The problem has been unchanged. The pain quality is similar to prior headaches. Associated symptoms include dizziness, insomnia and a visual change. Pertinent negatives include no abdominal pain, coughing or weakness. Nothing aggravates the symptoms. Treatments tried: Meclizine and Valium .  Insomnia Primary symptoms: sleep disturbance, difficulty falling asleep, frequent awakening.   The current episode started more than one month (some baseline insomnia but slightly worse). The problem occurs nightly. The symptoms are aggravated by anxiety (some racing thoughts and diffuse random itching). Treatments tried: melatonin, [place] makes her too groggy the next day.  RLS -  no longer taking requip  nightly since symptoms have improved.   Review of Systems  Constitutional:  Negative for chills, fatigue and unexpected weight change.  HENT:  Negative for trouble swallowing.   Eyes:  Negative for visual disturbance.  Respiratory:  Negative for cough, chest tightness, shortness of breath and wheezing.   Cardiovascular:  Negative for chest pain, palpitations and leg swelling.  Gastrointestinal:  Negative for abdominal pain, constipation and diarrhea.  Musculoskeletal:  Negative for arthralgias and myalgias (RLS is improved).  Skin:  Negative for color change and wound.       Diffuse random itching without rash  Neurological:  Positive for dizziness. Negative for weakness, light-headedness and headaches.  Psychiatric/Behavioral:  Positive for sleep disturbance. Negative for dysphoric mood. The patient has insomnia. The patient is not nervous/anxious.      Lab Results  Component Value Date   NA 142 07/21/2023   K 5.1 07/21/2023   CO2 24 07/21/2023   GLUCOSE 90 07/21/2023   BUN 16 07/21/2023   CREATININE 0.85 07/21/2023   CALCIUM 9.8  07/21/2023   EGFR 74 07/21/2023   GFRNONAA >60 11/21/2020   Lab Results  Component Value Date   CHOL 212 (H) 07/21/2023   HDL 64 07/21/2023   LDLCALC 128 (H) 07/21/2023   TRIG 116 07/21/2023   CHOLHDL 3.3 07/21/2023   Lab Results  Component Value Date   TSH 3.780 07/21/2023   Lab Results  Component Value Date   HGBA1C 5.5 11/27/2017   Lab Results  Component Value Date   WBC 5.5 07/21/2023   HGB 14.3 07/21/2023   HCT 42.4 07/21/2023   MCV 97 07/21/2023   PLT 284 07/21/2023   Lab Results  Component Value Date   ALT 15 07/21/2023   AST 23 07/21/2023   ALKPHOS 99 07/21/2023   BILITOT 0.6 07/21/2023   Lab Results  Component Value Date   VD25OH 48.5 07/18/2022     Patient Active Problem List   Diagnosis Date Noted   Primary insomnia 01/06/2024   History of colonic polyps    Polyp of ascending colon    Restless leg syndrome 03/14/2020   Osteopenia determined by x-ray 09/22/2019   Elevated LDL cholesterol level 07/18/2019   Vestibular migraine 11/27/2017   Hx of diverticulitis of colon    Benign neoplasm of ascending colon    History of colostomy reversal     Allergies  Allergen Reactions   Codeine Nausea Only   Doxycycline  Nausea And Vomiting   Levofloxacin Other (See Comments)    insomnia   Effexor  [Venlafaxine ] Nausea Only    And dizziness   Sulfa Antibiotics Rash   Topamax [Topiramate] Other (See Comments)  sedation    Past Surgical History:  Procedure Laterality Date   BREAST BIOPSY Right 03/15/2019   stereo biopsy/ x clip/ benign   COLONOSCOPY N/A 11/22/2021   Procedure: COLONOSCOPY;  Surgeon: Jinny Carmine, MD;  Location: [facility];  Service: Endoscopy;  Laterality: N/A;   COLONOSCOPY WITH PROPOFOL  N/A 07/27/2015   Procedure: COLONOSCOPY WITH PROPOFOL ;  Surgeon: Carmine Jinny, MD;  Location: [facility];  Service: Endoscopy;  Laterality: N/A;   COLONOSCOPY WITH PROPOFOL  N/A 03/14/2016   Procedure: COLONOSCOPY WITH PROPOFOL ;   Surgeon: Carmine Jinny, MD;  Location: [facility];  Service: Endoscopy;  Laterality: N/A;  Cannot arrive before 0830 AM   COLOSTOMY REVERSAL N/A 09/26/2015   Procedure: COLOSTOMY REVERSAL / TAKE DOWN;  Surgeon: Dorothyann LITTIE Husk, MD;  Location: [facility];  Service: General;  Laterality: N/A;   LAPAROTOMY N/A 03/01/2015   Procedure: EXPLORATORY LAPAROTOMY, SIGMOID COLECTOMY, COLOSTOMY;  Surgeon: Dorothyann LITTIE Husk, MD;  Location: [facility];  Service: General;  Laterality: N/A;   POLYPECTOMY  03/14/2016   Procedure: POLYPECTOMY;  Surgeon: Carmine Jinny, MD;  Location: [facility];  Service: Endoscopy;;   POLYPECTOMY  11/22/2021   Procedure: POLYPECTOMY;  Surgeon: Jinny Carmine, MD;  Location: [facility];  Service: Endoscopy;;    Social History   Tobacco Use   Smoking status: Never    Passive exposure: Past   Smokeless tobacco: Never  Vaping Use   Vaping status: Never Used  Substance Use Topics   Alcohol use: No    Alcohol/week: 0.0 standard drinks of alcohol   Drug use: No     Medication list has been reviewed and updated.  Current Meds  Medication Sig   CALCIUM 600 1500 (600 Ca) MG TABS tablet    cholecalciferol (VITAMIN D ) 1000 units tablet Take 1,000 Units by mouth daily.   cyanocobalamin 1000 MCG tablet Take 1,000 mcg by mouth daily.   diazepam  (VALIUM ) 2 MG tablet Take 1 tablet (2 mg total) by mouth every 6 (six) hours as needed for anxiety.   fluticasone  (FLONASE ) 50 MCG/ACT nasal spray Place 1 spray into both nostrils daily.   melatonin 3 MG TABS tablet Take 3 mg by mouth at bedtime.   Multiple Vitamin (MULTIVITAMIN) tablet Take 1 tablet by mouth daily.   polyethylene glycol (MIRALAX  / GLYCOLAX ) packet Take 17 g by mouth daily.   rOPINIRole  (REQUIP ) 0.25 MG tablet TAKE 1 TABLET BY MOUTH AT BEDTIME.   triamcinolone  (NASACORT  ALLERGY 24HR) 55 MCG/ACT AERO nasal inhaler Place 2 sprays into the nose daily.   vitamin C (ASCORBIC ACID) 500 MG tablet Take 500 mg  by mouth daily.       01/06/2024    9:18 AM 10/27/2023   10:19 AM 07/21/2023    9:19 AM 02/03/2023   11:29 AM  GAD 7 : Generalized Anxiety Score  Nervous, Anxious, on Edge 0 0 0 0  Control/stop worrying 0 0 0 0  Worry too much - different things 0 0 0 0  Trouble relaxing 0 0 0 0  Restless 0 0 0 0  Easily annoyed or irritable 0 0 0 0  Afraid - awful might happen 0 0 0 0  Total GAD 7 Score 0 0 0 0  Anxiety Difficulty Not difficult at all Not difficult at all Not difficult at all Not difficult at all       01/06/2024    9:18 AM 10/27/2023   10:18 AM 07/23/2023    8:06 AM  Depression screen PHQ 2/9  Decreased Interest 0 0 0  Down, Depressed, Hopeless 0 0 0  PHQ - 2 Score 0 0 0  Altered sleeping 0 0 0  Tired, decreased energy 0 0 0  Change in appetite 0 0 0  Feeling bad or failure about yourself  0 0 0  Trouble concentrating 0 0 0  Moving slowly or fidgety/restless 0 0 0  Suicidal thoughts 0 0 0  PHQ-9 Score 0 0 0  Difficult doing work/chores Not difficult at all Not difficult at all Not difficult at all    BP Readings from Last 3 Encounters:  01/06/24 108/70  10/27/23 108/70  07/21/23 112/72    Physical Exam Vitals and nursing note reviewed.  Constitutional:      General: She is not in acute distress.    Appearance: Normal appearance. She is well-developed.  HENT:     Head: Normocephalic and atraumatic.  Neck:     Vascular: No carotid bruit.  Cardiovascular:     Rate and Rhythm: Normal rate and regular rhythm.  Pulmonary:     Effort: Pulmonary effort is normal. No respiratory distress.     Breath sounds: No wheezing or rhonchi.  Musculoskeletal:     Cervical back: Normal range of motion.     Right lower leg: No edema.     Left lower leg: No edema.  Lymphadenopathy:     Cervical: No cervical adenopathy.  Skin:    General: Skin is warm and dry.     Findings: No rash.  Neurological:     General: No focal deficit present.     Mental Status: She is alert and  oriented to person, place, and time.  Psychiatric:        Mood and Affect: Mood normal.        Behavior: Behavior normal.     Wt Readings from Last 3 Encounters:  01/06/24 159 lb (72.1 kg)  10/27/23 169 lb (76.7 kg)  07/23/23 166 lb (75.3 kg)    BP 108/70   Pulse 88   Ht 5' 4.5 (1.638 m)   Wt 159 lb (72.1 kg)   SpO2 97%   BMI 26.87 kg/m   Assessment and Plan:  Problem List Items Addressed This Visit       Unprioritized   Osteopenia determined by x-ray (Chronic)   Continue calcium and vitamin D  Repeat DEXA this December      Relevant Orders   DG Bone Density   Primary insomnia   Non specific, age related sleep disturbance Continue melatonin, low dose benadryl  to reduce itching      Restless leg syndrome (Chronic)   Symptoms resolved; no longer taking Requip       Vestibular migraine - Primary (Chronic)   Migraines unchanged She take tylenol  if headache occurs.  Occasional valium  if she has vertigo      Relevant Medications   diazepam  (VALIUM ) 2 MG tablet   Other Visit Diagnoses       Encounter for screening mammogram for breast cancer       Relevant Orders   MM 3D SCREENING MAMMOGRAM BILATERAL BREAST     Encounter for immunization       Relevant Orders   Flu vaccine trivalent PF, 6mos and older(Flulaval,Afluria,Fluarix,Fluzone) (Completed)       Return in about 6 months (around 07/06/2024) for [facility] CPX  April 2026 with Dan.    Leita HILARIO Adie, MD [facility] [place]

## 2024-01-25 DIAGNOSIS — Z859 Personal history of malignant neoplasm, unspecified: Secondary | ICD-10-CM | POA: Diagnosis not present

## 2024-01-25 DIAGNOSIS — L720 Epidermal cyst: Secondary | ICD-10-CM | POA: Diagnosis not present

## 2024-01-25 DIAGNOSIS — L821 Other seborrheic keratosis: Secondary | ICD-10-CM | POA: Diagnosis not present

## 2024-01-25 DIAGNOSIS — L570 Actinic keratosis: Secondary | ICD-10-CM | POA: Diagnosis not present

## 2024-01-25 DIAGNOSIS — L578 Other skin changes due to chronic exposure to nonionizing radiation: Secondary | ICD-10-CM | POA: Diagnosis not present

## 2024-01-25 DIAGNOSIS — D485 Neoplasm of uncertain behavior of skin: Secondary | ICD-10-CM | POA: Diagnosis not present

## 2024-01-25 DIAGNOSIS — Z872 Personal history of diseases of the skin and subcutaneous tissue: Secondary | ICD-10-CM | POA: Diagnosis not present

## 2024-01-25 DIAGNOSIS — D0339 Melanoma in situ of other parts of face: Secondary | ICD-10-CM | POA: Diagnosis not present

## 2024-02-29 DIAGNOSIS — M25462 Effusion, left knee: Secondary | ICD-10-CM | POA: Diagnosis not present

## 2024-02-29 DIAGNOSIS — M25562 Pain in left knee: Secondary | ICD-10-CM | POA: Diagnosis not present

## 2024-02-29 DIAGNOSIS — M67462 Ganglion, left knee: Secondary | ICD-10-CM | POA: Diagnosis not present

## 2024-02-29 DIAGNOSIS — M1712 Unilateral primary osteoarthritis, left knee: Secondary | ICD-10-CM | POA: Diagnosis not present

## 2024-03-17 ENCOUNTER — Ambulatory Visit
Admission: RE | Admit: 2024-03-17 | Discharge: 2024-03-17 | Disposition: A | Discharge status: Home / Self Care | Source: Ambulatory Visit | Attending: Internal Medicine | Admitting: Internal Medicine

## 2024-03-17 ENCOUNTER — Ambulatory Visit: Payer: Self-pay | Admitting: Internal Medicine

## 2024-03-17 DIAGNOSIS — M8588 Other specified disorders of bone density and structure, other site: Secondary | ICD-10-CM | POA: Insufficient documentation

## 2024-03-17 DIAGNOSIS — M85851 Other specified disorders of bone density and structure, right thigh: Secondary | ICD-10-CM | POA: Insufficient documentation

## 2024-03-17 DIAGNOSIS — M85852 Other specified disorders of bone density and structure, left thigh: Secondary | ICD-10-CM | POA: Insufficient documentation

## 2024-03-17 DIAGNOSIS — Z780 Asymptomatic menopausal state: Secondary | ICD-10-CM | POA: Insufficient documentation

## 2024-03-17 DIAGNOSIS — Z1231 Encounter for screening mammogram for malignant neoplasm of breast: Secondary | ICD-10-CM | POA: Insufficient documentation

## 2024-06-17 ENCOUNTER — Encounter

## 2024-07-06 ENCOUNTER — Encounter: Admitting: Physician Assistant

## 2024-07-21 ENCOUNTER — Encounter: Admitting: Physician Assistant

## 2024-08-04 ENCOUNTER — Ambulatory Visit
